# Patient Record
Sex: Male | Born: 1955 | Race: White | Hispanic: No | Marital: Single | Smoking: Former smoker
Health system: Southern US, Community
[De-identification: ages and names within clinical notes are randomized; demographics above are authoritative.]

## PROBLEM LIST (undated history)

## (undated) DIAGNOSIS — K219 Gastro-esophageal reflux disease without esophagitis: Secondary | ICD-10-CM

## (undated) DIAGNOSIS — F329 Major depressive disorder, single episode, unspecified: Secondary | ICD-10-CM

## (undated) DIAGNOSIS — I639 Cerebral infarction, unspecified: Secondary | ICD-10-CM

## (undated) DIAGNOSIS — J45909 Unspecified asthma, uncomplicated: Secondary | ICD-10-CM

## (undated) DIAGNOSIS — B1920 Unspecified viral hepatitis C without hepatic coma: Secondary | ICD-10-CM

## (undated) DIAGNOSIS — I509 Heart failure, unspecified: Secondary | ICD-10-CM

## (undated) DIAGNOSIS — M24572 Contracture, left ankle: Secondary | ICD-10-CM

## (undated) DIAGNOSIS — I10 Essential (primary) hypertension: Secondary | ICD-10-CM

## (undated) DIAGNOSIS — M24561 Contracture, right knee: Secondary | ICD-10-CM

## (undated) DIAGNOSIS — T7840XA Allergy, unspecified, initial encounter: Secondary | ICD-10-CM

## (undated) DIAGNOSIS — R1310 Dysphagia, unspecified: Secondary | ICD-10-CM

## (undated) DIAGNOSIS — M24552 Contracture, left hip: Secondary | ICD-10-CM

## (undated) DIAGNOSIS — G8190 Hemiplegia, unspecified affecting unspecified side: Secondary | ICD-10-CM

## (undated) DIAGNOSIS — F419 Anxiety disorder, unspecified: Secondary | ICD-10-CM

## (undated) DIAGNOSIS — E119 Type 2 diabetes mellitus without complications: Secondary | ICD-10-CM

## (undated) DIAGNOSIS — F1910 Other psychoactive substance abuse, uncomplicated: Secondary | ICD-10-CM

## (undated) HISTORY — DX: Unspecified viral hepatitis C without hepatic coma: B19.20

## (undated) HISTORY — DX: Allergy, unspecified, initial encounter: T78.40XA

## (undated) HISTORY — DX: Unspecified asthma, uncomplicated: J45.909

## (undated) HISTORY — DX: Type 2 diabetes mellitus without complications: E11.9

## (undated) HISTORY — DX: Anxiety disorder, unspecified: F41.9

## (undated) HISTORY — DX: Essential (primary) hypertension: I10

## (undated) HISTORY — DX: Major depressive disorder, single episode, unspecified: F32.9

## (undated) HISTORY — DX: Cerebral infarction, unspecified: I63.9

## (undated) HISTORY — DX: Other psychoactive substance abuse, uncomplicated: F19.10

## (undated) HISTORY — DX: Dysphagia, unspecified: R13.10

---

## 2015-06-05 ENCOUNTER — Inpatient Hospital Stay (HOSPITAL_COMMUNITY)
Admission: EM | Admit: 2015-06-05 | Discharge: 2015-06-27 | DRG: 064 | Disposition: A | Discharge status: Skilled Nursing Facility | Payer: Medicaid Other | Attending: Neurology | Admitting: Neurology

## 2015-06-05 ENCOUNTER — Emergency Department (HOSPITAL_COMMUNITY): Payer: Medicaid Other

## 2015-06-05 DIAGNOSIS — J9600 Acute respiratory failure, unspecified whether with hypoxia or hypercapnia: Secondary | ICD-10-CM | POA: Diagnosis not present

## 2015-06-05 DIAGNOSIS — E870 Hyperosmolality and hypernatremia: Secondary | ICD-10-CM | POA: Diagnosis not present

## 2015-06-05 DIAGNOSIS — T17908A Unspecified foreign body in respiratory tract, part unspecified causing other injury, initial encounter: Secondary | ICD-10-CM | POA: Insufficient documentation

## 2015-06-05 DIAGNOSIS — Z9289 Personal history of other medical treatment: Secondary | ICD-10-CM

## 2015-06-05 DIAGNOSIS — R000 Tachycardia, unspecified: Secondary | ICD-10-CM | POA: Diagnosis present

## 2015-06-05 DIAGNOSIS — I610 Nontraumatic intracerebral hemorrhage in hemisphere, subcortical: Secondary | ICD-10-CM | POA: Diagnosis not present

## 2015-06-05 DIAGNOSIS — I619 Nontraumatic intracerebral hemorrhage, unspecified: Secondary | ICD-10-CM

## 2015-06-05 DIAGNOSIS — R4781 Slurred speech: Secondary | ICD-10-CM | POA: Diagnosis not present

## 2015-06-05 DIAGNOSIS — F419 Anxiety disorder, unspecified: Secondary | ICD-10-CM | POA: Diagnosis present

## 2015-06-05 DIAGNOSIS — R404 Transient alteration of awareness: Secondary | ICD-10-CM

## 2015-06-05 DIAGNOSIS — R0603 Acute respiratory distress: Secondary | ICD-10-CM

## 2015-06-05 DIAGNOSIS — J9811 Atelectasis: Secondary | ICD-10-CM

## 2015-06-05 DIAGNOSIS — I639 Cerebral infarction, unspecified: Secondary | ICD-10-CM

## 2015-06-05 DIAGNOSIS — E876 Hypokalemia: Secondary | ICD-10-CM | POA: Diagnosis not present

## 2015-06-05 DIAGNOSIS — D696 Thrombocytopenia, unspecified: Secondary | ICD-10-CM | POA: Diagnosis present

## 2015-06-05 DIAGNOSIS — R1310 Dysphagia, unspecified: Secondary | ICD-10-CM

## 2015-06-05 DIAGNOSIS — R748 Abnormal levels of other serum enzymes: Secondary | ICD-10-CM | POA: Insufficient documentation

## 2015-06-05 DIAGNOSIS — G9340 Encephalopathy, unspecified: Secondary | ICD-10-CM | POA: Diagnosis present

## 2015-06-05 DIAGNOSIS — G936 Cerebral edema: Secondary | ICD-10-CM | POA: Diagnosis not present

## 2015-06-05 DIAGNOSIS — Y95 Nosocomial condition: Secondary | ICD-10-CM | POA: Diagnosis present

## 2015-06-05 DIAGNOSIS — R29810 Facial weakness: Secondary | ICD-10-CM | POA: Diagnosis present

## 2015-06-05 DIAGNOSIS — B182 Chronic viral hepatitis C: Secondary | ICD-10-CM | POA: Diagnosis present

## 2015-06-05 DIAGNOSIS — E46 Unspecified protein-calorie malnutrition: Secondary | ICD-10-CM | POA: Diagnosis present

## 2015-06-05 DIAGNOSIS — R739 Hyperglycemia, unspecified: Secondary | ICD-10-CM | POA: Diagnosis not present

## 2015-06-05 DIAGNOSIS — Z0189 Encounter for other specified special examinations: Secondary | ICD-10-CM

## 2015-06-05 DIAGNOSIS — J9601 Acute respiratory failure with hypoxia: Secondary | ICD-10-CM | POA: Diagnosis not present

## 2015-06-05 DIAGNOSIS — J154 Pneumonia due to other streptococci: Secondary | ICD-10-CM | POA: Diagnosis not present

## 2015-06-05 DIAGNOSIS — F10239 Alcohol dependence with withdrawal, unspecified: Secondary | ICD-10-CM | POA: Diagnosis present

## 2015-06-05 DIAGNOSIS — G935 Compression of brain: Secondary | ICD-10-CM | POA: Diagnosis not present

## 2015-06-05 DIAGNOSIS — Z720 Tobacco use: Secondary | ICD-10-CM | POA: Insufficient documentation

## 2015-06-05 DIAGNOSIS — R319 Hematuria, unspecified: Secondary | ICD-10-CM | POA: Diagnosis not present

## 2015-06-05 DIAGNOSIS — E785 Hyperlipidemia, unspecified: Secondary | ICD-10-CM | POA: Diagnosis present

## 2015-06-05 DIAGNOSIS — I10 Essential (primary) hypertension: Secondary | ICD-10-CM | POA: Diagnosis present

## 2015-06-05 DIAGNOSIS — I248 Other forms of acute ischemic heart disease: Secondary | ICD-10-CM | POA: Diagnosis not present

## 2015-06-05 DIAGNOSIS — E872 Acidosis: Secondary | ICD-10-CM | POA: Diagnosis not present

## 2015-06-05 DIAGNOSIS — R0600 Dyspnea, unspecified: Secondary | ICD-10-CM

## 2015-06-05 DIAGNOSIS — Z6820 Body mass index (BMI) 20.0-20.9, adult: Secondary | ICD-10-CM

## 2015-06-05 DIAGNOSIS — J690 Pneumonitis due to inhalation of food and vomit: Secondary | ICD-10-CM | POA: Diagnosis not present

## 2015-06-05 DIAGNOSIS — F1410 Cocaine abuse, uncomplicated: Secondary | ICD-10-CM | POA: Insufficient documentation

## 2015-06-05 DIAGNOSIS — G8194 Hemiplegia, unspecified affecting left nondominant side: Secondary | ICD-10-CM | POA: Diagnosis present

## 2015-06-05 DIAGNOSIS — N179 Acute kidney failure, unspecified: Secondary | ICD-10-CM | POA: Diagnosis not present

## 2015-06-05 DIAGNOSIS — F10230 Alcohol dependence with withdrawal, uncomplicated: Secondary | ICD-10-CM | POA: Insufficient documentation

## 2015-06-05 DIAGNOSIS — Z4659 Encounter for fitting and adjustment of other gastrointestinal appliance and device: Secondary | ICD-10-CM

## 2015-06-05 LAB — I-STAT CHEM 8, ED
BUN: 26 mg/dL — ABNORMAL HIGH (ref 6–20)
CHLORIDE: 108 mmol/L (ref 101–111)
CREATININE: 1.1 mg/dL (ref 0.61–1.24)
Calcium, Ion: 1.15 mmol/L (ref 1.13–1.30)
GLUCOSE: 122 mg/dL — AB (ref 65–99)
HEMATOCRIT: 48 % (ref 39.0–52.0)
Hemoglobin: 16.3 g/dL (ref 13.0–17.0)
Potassium: 3.7 mmol/L (ref 3.5–5.1)
Sodium: 143 mmol/L (ref 135–145)
TCO2: 19 mmol/L (ref 0–100)

## 2015-06-05 LAB — PROTIME-INR
INR: 1.11 (ref 0.00–1.49)
Prothrombin Time: 14.5 seconds (ref 11.6–15.2)

## 2015-06-05 LAB — COMPREHENSIVE METABOLIC PANEL
ALT: 208 U/L — ABNORMAL HIGH (ref 17–63)
AST: 186 U/L — ABNORMAL HIGH (ref 15–41)
Albumin: 3.8 g/dL (ref 3.5–5.0)
Alkaline Phosphatase: 75 U/L (ref 38–126)
Anion gap: 8 (ref 5–15)
BILIRUBIN TOTAL: 0.7 mg/dL (ref 0.3–1.2)
BUN: 21 mg/dL — AB (ref 6–20)
CO2: 21 mmol/L — ABNORMAL LOW (ref 22–32)
Calcium: 9.5 mg/dL (ref 8.9–10.3)
Chloride: 109 mmol/L (ref 101–111)
Creatinine, Ser: 1.13 mg/dL (ref 0.61–1.24)
GFR calc Af Amer: >60 mL/min (ref >60)
Glucose, Bld: 121 mg/dL — ABNORMAL HIGH (ref 65–99)
Potassium: 3.8 mmol/L (ref 3.5–5.1)
SODIUM: 138 mmol/L (ref 135–145)
Total Protein: 7.1 g/dL (ref 6.5–8.1)

## 2015-06-05 LAB — DIFFERENTIAL
Band Neutrophils: 0 % (ref 0–10)
Basophils Absolute: 0 10*3/uL (ref 0.0–0.1)
Basophils Relative: 0 % (ref 0–1)
Blasts: 0 %
EOS PCT: 2 % (ref 0–5)
Eosinophils Absolute: 0.1 10*3/uL (ref 0.0–0.7)
LYMPHS ABS: 1.7 10*3/uL (ref 0.7–4.0)
LYMPHS PCT: 29 % (ref 12–46)
METAMYELOCYTES PCT: 0 %
MONOS PCT: 8 % (ref 3–12)
Monocytes Absolute: 0.5 10*3/uL (ref 0.1–1.0)
Myelocytes: 0 %
Neutro Abs: 3.5 10*3/uL (ref 1.7–7.7)
Neutrophils Relative %: 61 % (ref 43–77)
OTHER: 0 %
Promyelocytes Absolute: 0 %
nRBC: 0 /100 WBC

## 2015-06-05 LAB — CBC
HEMATOCRIT: 45 % (ref 39.0–52.0)
Hemoglobin: 15.8 g/dL (ref 13.0–17.0)
MCH: 32.5 pg (ref 26.0–34.0)
MCHC: 35.1 g/dL (ref 30.0–36.0)
MCV: 92.6 fL (ref 78.0–100.0)
Platelets: 69 10*3/uL — ABNORMAL LOW (ref 150–400)
RBC: 4.86 MIL/uL (ref 4.22–5.81)
RDW: 13.6 % (ref 11.5–15.5)
WBC: 5.8 10*3/uL (ref 4.0–10.5)

## 2015-06-05 LAB — APTT: aPTT: 32 seconds (ref 24–37)

## 2015-06-05 LAB — I-STAT TROPONIN, ED: Troponin i, poc: 0.03 ng/mL (ref 0.00–0.08)

## 2015-06-05 MED ORDER — PANTOPRAZOLE SODIUM 40 MG IV SOLR
40.0000 mg | Freq: Every day | INTRAVENOUS | Status: DC
  Administered 2015-06-06 (×2): 40 mg via INTRAVENOUS
  Filled 2015-06-05 (×3): qty 40

## 2015-06-05 MED ORDER — ACETAMINOPHEN 325 MG PO TABS
650.0000 mg | ORAL_TABLET | ORAL | Status: DC | PRN
  Administered 2015-06-08 – 2015-06-11 (×6): 650 mg via ORAL
  Filled 2015-06-05 (×6): qty 2

## 2015-06-05 MED ORDER — LABETALOL HCL 5 MG/ML IV SOLN
10.0000 mg | INTRAVENOUS | Status: DC | PRN
  Administered 2015-06-09: 20 mg via INTRAVENOUS
  Administered 2015-06-09 – 2015-06-10 (×2): 40 mg via INTRAVENOUS
  Administered 2015-06-12 – 2015-06-13 (×2): 20 mg via INTRAVENOUS
  Administered 2015-06-13: 40 mg via INTRAVENOUS
  Administered 2015-06-13: 20 mg via INTRAVENOUS
  Administered 2015-06-13: 40 mg via INTRAVENOUS
  Administered 2015-06-14 (×2): 20 mg via INTRAVENOUS
  Administered 2015-06-15: 40 mg via INTRAVENOUS
  Administered 2015-06-15: 20 mg via INTRAVENOUS
  Administered 2015-06-15: 40 mg via INTRAVENOUS
  Administered 2015-06-15 – 2015-06-17 (×3): 20 mg via INTRAVENOUS
  Filled 2015-06-05 (×2): qty 8
  Filled 2015-06-05: qty 4
  Filled 2015-06-05 (×2): qty 8
  Filled 2015-06-05: qty 4
  Filled 2015-06-05 (×2): qty 8
  Filled 2015-06-05 (×2): qty 4
  Filled 2015-06-05: qty 8
  Filled 2015-06-05 (×2): qty 4
  Filled 2015-06-05: qty 8
  Filled 2015-06-05: qty 4

## 2015-06-05 MED ORDER — CLEVIDIPINE BUTYRATE 0.5 MG/ML IV EMUL
0.0000 mg/h | INTRAVENOUS | Status: DC
  Administered 2015-06-06: 8 mg/h via INTRAVENOUS
  Administered 2015-06-06: 2 mg/h via INTRAVENOUS
  Administered 2015-06-06 – 2015-06-07 (×2): 10 mg/h via INTRAVENOUS
  Administered 2015-06-07: 12 mg/h via INTRAVENOUS
  Administered 2015-06-07: 8 mg/h via INTRAVENOUS
  Administered 2015-06-07: 14 mg/h via INTRAVENOUS
  Administered 2015-06-07: 6 mg/h via INTRAVENOUS
  Administered 2015-06-07: 10 mg/h via INTRAVENOUS
  Administered 2015-06-08: 2.5 mg/h via INTRAVENOUS
  Administered 2015-06-08: 11 mg/h via INTRAVENOUS
  Administered 2015-06-08: 5 mg/h via INTRAVENOUS
  Administered 2015-06-08 (×2): 10 mg/h via INTRAVENOUS
  Administered 2015-06-09: 15 mg/h via INTRAVENOUS
  Administered 2015-06-09: 10 mg/h via INTRAVENOUS
  Filled 2015-06-05 (×4): qty 100
  Filled 2015-06-05: qty 50
  Filled 2015-06-05: qty 100
  Filled 2015-06-05: qty 50
  Filled 2015-06-05: qty 100
  Filled 2015-06-05 (×3): qty 50
  Filled 2015-06-05 (×5): qty 100
  Filled 2015-06-05: qty 50
  Filled 2015-06-05: qty 100
  Filled 2015-06-05: qty 50
  Filled 2015-06-05: qty 100
  Filled 2015-06-05: qty 50
  Filled 2015-06-05: qty 100

## 2015-06-05 MED ORDER — ACETAMINOPHEN 650 MG RE SUPP: 650.0000 mg | RECTAL | Status: DC | PRN

## 2015-06-05 MED ORDER — SENNOSIDES-DOCUSATE SODIUM 8.6-50 MG PO TABS
1.0000 | ORAL_TABLET | Freq: Two times a day (BID) | ORAL | Status: DC
Start: 2015-06-06 — End: 2015-06-27
  Administered 2015-06-07 – 2015-06-26 (×30): 1 via ORAL
  Filled 2015-06-05 (×42): qty 1

## 2015-06-05 MED ORDER — LABETALOL HCL 5 MG/ML IV SOLN
10.0000 mg | INTRAVENOUS | Status: DC | PRN
  Administered 2015-06-05 (×2): 10 mg via INTRAVENOUS
  Filled 2015-06-05: qty 4

## 2015-06-05 MED ORDER — STROKE: EARLY STAGES OF RECOVERY BOOK
Freq: Once | Status: AC
  Administered 2015-06-06: 02:00:00
  Filled 2015-06-05: qty 1

## 2015-06-05 NOTE — ED Provider Notes (Signed)
CSN: [id number]     Arrival date & time 06/05/15  2231 History   First MD Initiated Contact with Patient 06/05/15 2231     Chief Complaint  Patient presents with  . Code Stroke    An emergency department physician performed an initial assessment on this suspected stroke patient at 2231. (Consider location/radiation/quality/duration/timing/severity/associated sxs/prior Treatment) HPI  59 year old male with no known significant past medical history presents as a code stroke. About 1-1/2 hours prior to arrival the patient states he all of a sudden felt left arm and leg weakness to the point that he cannot move. He is also slurring his speech which is new. Patient has not seen a doctor in several years. EMS noted his blood pressure to be 500/938 systolic. Patient denies a prior history of hypertension but a technologist he hasn't seen anybody for any checkups. Patient denies any headache or dizziness.  No past medical history on file. No past surgical history on file. No family history on file. History  Substance Use Topics  . Smoking status: Not on file  . Smokeless tobacco: Not on file  . Alcohol Use: Not on file    Review of Systems  Cardiovascular: Negative for chest pain.  Gastrointestinal: Negative for vomiting.  Neurological: Positive for speech difficulty, weakness and numbness. Negative for headaches.  All other systems reviewed and are negative.     Allergies  Review of patient's allergies indicates no known allergies.  Home Medications   Prior to Admission medications   Not on File   BP 193/110 mmHg  Pulse 95  Temp(Src) 98.3 F (36.8 C) (Oral)  Resp 24  Ht 6' (1.829 m)  Wt 153 lb 7 oz (69.6 kg)  BMI 20.81 kg/m2  SpO2 94% Physical Exam  Constitutional: He is oriented to person, place, and time. He appears well-developed and well-nourished.  HENT:  Head: Normocephalic and atraumatic.  Right Ear: External ear normal.  Left Ear: External ear normal.  Nose:  Nose normal.  Eyes: EOM are normal. Pupils are equal, round, and reactive to light. Right eye exhibits no discharge. Left eye exhibits no discharge.  Neck: Neck supple.  Cardiovascular: Normal rate, regular rhythm, normal heart sounds and intact distal pulses.   Pulmonary/Chest: Effort normal and breath sounds normal.  Abdominal: Soft. He exhibits no distension. There is no tenderness.  Musculoskeletal: He exhibits no edema.  Neurological: He is alert and oriented to person, place, and time.  Slurred speech with left facial droop. LUE and LLE flaccid. RUE, RLE 5/5  Skin: Skin is warm and dry.  Nursing note and vitals reviewed.   ED Course  Procedures (including critical care time) Labs Review Labs Reviewed  CBC - Abnormal; Notable for the following:    Platelets 69 (*)    All other components within normal limits  COMPREHENSIVE METABOLIC PANEL - Abnormal; Notable for the following:    CO2 21 (*)    Glucose, Bld 121 (*)    BUN 21 (*)    AST 186 (*)    ALT 208 (*)    All other components within normal limits  I-STAT CHEM 8, ED - Abnormal; Notable for the following:    BUN 26 (*)    Glucose, Bld 122 (*)    All other components within normal limits  MRSA PCR SCREENING  PROTIME-INR  APTT  DIFFERENTIAL  I-STAT TROPOININ, ED  CBG MONITORING, ED    Imaging Review Ct Head Wo Contrast  06/05/2015   CLINICAL DATA:  59 year old male with left-sided weakness. Code stroke.  EXAM: CT HEAD WITHOUT CONTRAST  TECHNIQUE: Contiguous axial images were obtained from the base of the skull through the vertex without intravenous contrast.  COMPARISON:  None.  FINDINGS: There is a 2.1 x 1.6 cm acute intraparenchymal hemorrhage centered at the right thalamus. There is mild adjacent edema. No significant mass effect. No midline shift.  The ventricles and the sulci are appropriate in size for the patient's age. The The gray-white matter differentiation is preserved.  The visualized paranasal sinuses and  mastoid air cells are well aerated. The calvarium is intact.  IMPRESSION: Acute right thalamic hemorrhage.  Critical Value/emergent results were called by telephone at the time of interpretation on 06/05/2015 at 10:39 pm to Dr. Janann Colonel , who verbally acknowledged these results.   Electronically Signed   By: Anner Crete M.D.   On: 06/05/2015 22:41     EKG Interpretation   Date/Time:  Friday June 05 2015 22:39:21 EDT Ventricular Rate:  108 PR Interval:  145 QRS Duration: 108 QT Interval:  372 QTC Calculation: 499 R Axis:   36 Text Interpretation:  Sinus tachycardia Probable left atrial enlargement  LVH with secondary repolarization abnormality Anterior ST elevation,  probably due to LVH Borderline prolonged QT interval No old tracing to  compare Confirmed by Lavonia Eager  MD, Samira Acero ([id number]) on 06/06/2015 12:12:05 AM      CRITICAL CARE Performed by: Sherwood Gambler T   Total critical care time: 30 minutes  Critical care time was exclusive of separately billable procedures and treating other patients.  Critical care was necessary to treat or prevent imminent or life-threatening deterioration.  Critical care was time spent personally by me on the following activities: development of treatment plan with patient and/or surrogate as well as nursing, discussions with consultants, evaluation of patient's response to treatment, examination of patient, obtaining history from patient or surrogate, ordering and performing treatments and interventions, ordering and review of laboratory studies, ordering and review of radiographic studies, pulse oximetry and re-evaluation of patient's condition.  MDM   Final diagnoses:  Nontraumatic subcortical hemorrhage of cerebral hemisphere    Patient with an acute nontraumatic intracranial hemorrhage that appears to be causing all the symptoms. His airway is intact, he has slurred speech but no signs of airway compromise. Given his significant hypertension, he  was given labetalol (with tachycardia) and then started clevidipine for BP control. Neuro at bedside and will admit to ICU.   Sherwood Gambler, MD 06/06/15 [phone]

## 2015-06-05 NOTE — H&P (Addendum)
Stroke Consult    Chief Complaint: left sided weakness HPI: Mike Rowe is an 59 y.o. male with no reported past medical history (hasn't been to doctors in years) presents with acute onset of slurred speech and left sided weakness. LSW 2130 when he noted he was unable to stand or use his left side. EMS called, upon arrival noted BP of 240/140. Code stroke activated. CT head imaging reviewed, shows right sided thalamic/BG ICH. Denies taking any blood thinners.   He denies any past medical history including hypertension. Denies any surgical history. Denies alcohol or drug abuse.   Date last known well: 06/05/2015 Time last known well: 2130 tPA Given: no, ICH Modified Rankin: 0 ICH Score: 0   Initial NIHSS of 12  No past medical history on file.  No past surgical history on file.  No family history on file. Social History:  has no tobacco, alcohol, and drug history on file.  Allergies: No Known Allergies   (Not in a hospital admission)  ROS: Out of a complete 14 system review, the patient complains of only the following symptoms, and all other reviewed systems are negative. + weakness   Physical Examination: Filed Vitals:   06/05/15 2251  BP: 193/110  Pulse: 95  Temp:   Resp: 24   Physical Exam  Constitutional: He appears well-developed and well-nourished.  Psych: Affect appropriate to situation Eyes: No scleral injection HENT: No OP obstrucion Head: Normocephalic.  Cardiovascular: Normal rate and regular rhythm.  Respiratory: Effort normal and breath sounds normal.  GI: Soft. Bowel sounds are normal. No distension. There is no tenderness.  Skin: WDI  Neurologic Examination: Mental Status: Alert, oriented, thought content appropriate.  Speech fluent without evidence of aphasia. Moderate dysarthria. Able to follow 3 step commands without difficulty. Cranial Nerves: II: funduscopic exam wnl bilaterally, visual fields grossly normal, pupils equal, round, reactive to  light and accommodation III,IV, VI: ptosis not present, extra-ocular motions intact bilaterally V,VII: left facial weakness, facial light touch sensation normal bilaterally VIII: hearing normal bilaterally IX,X: gag reflex present XI: trapezius strength/neck flexion strength normal bilaterally XII: tongue strength normal  Motor: RUE and RLE 5/5 strength Flaccid LUE 1/5 proximal LLE, 2/5 distal Sensory: decreased LT and PP on left side Deep Tendon Reflexes: 2+ and symmetric throughout Plantars: Right: downgoing   Left: downgoing Cerebellar: Normal FTN and HTS on right side Gait: deferred  Laboratory Studies:   Basic Metabolic Panel:  Recent Labs Lab 06/05/15 2234  NA 143  K 3.7  CL 108  GLUCOSE 122*  BUN 26*  CREATININE 1.10    Liver Function Tests: No results for input(s): AST, ALT, ALKPHOS, BILITOT, PROT, ALBUMIN in the last 168 hours. No results for input(s): LIPASE, AMYLASE in the last 168 hours. No results for input(s): AMMONIA in the last 168 hours.  CBC:  Recent Labs Lab 06/05/15 2227 06/05/15 2234  WBC 5.8  --   NEUTROABS PENDING  --   HGB 15.8 16.3  HCT 45.0 48.0  MCV 92.6  --   PLT PENDING  --     Cardiac Enzymes: No results for input(s): CKTOTAL, CKMB, CKMBINDEX, TROPONINI in the last 168 hours.  BNP: Invalid input(s): POCBNP  CBG: No results for input(s): GLUCAP in the last 168 hours.  Microbiology: No results found for this or any previous visit.  Coagulation Studies:  Recent Labs  06/05/15 2227  LABPROT 14.5  INR 1.11    Urinalysis: No results for input(s): COLORURINE, LABSPEC, [place], GLUCOSEU, [place], [place],  KETONESUR, PROTEINUR, UROBILINOGEN, NITRITE, LEUKOCYTESUR in the last 168 hours.  Invalid input(s): APPERANCEUR  Lipid Panel:  No results found for: CHOL, TRIG, HDL, CHOLHDL, VLDL, LDLCALC  HgbA1C: No results found for: HGBA1C  Urine Drug Screen:  No results found for: LABOPIA, COCAINSCRNUR, LABBENZ, AMPHETMU,  THCU, LABBARB  Alcohol Level: No results for input(s): ETH in the last 168 hours.  Other results: EKG: sinus tachycardia.  Imaging: Ct Head Wo Contrast  06/05/2015   CLINICAL DATA:  59 year old male with left-sided weakness. Code stroke.  EXAM: CT HEAD WITHOUT CONTRAST  TECHNIQUE: Contiguous axial images were obtained from the base of the skull through the vertex without intravenous contrast.  COMPARISON:  None.  FINDINGS: There is a 2.1 x 1.6 cm acute intraparenchymal hemorrhage centered at the right thalamus. There is mild adjacent edema. No significant mass effect. No midline shift.  The ventricles and the sulci are appropriate in size for the patient's age. The The gray-white matter differentiation is preserved.  The visualized paranasal sinuses and mastoid air cells are well aerated. The calvarium is intact.  IMPRESSION: Acute right thalamic hemorrhage.  Critical Value/emergent results were called by telephone at the time of interpretation on 06/05/2015 at 10:39 pm to Dr. Janann Colonel , who verbally acknowledged these results.   Electronically Signed   By: Anner Crete M.D.   On: 06/05/2015 22:41    Assessment: 60 y.o. male with no reported past medical history (has not been to doctor in years) presents with acute onset of slurred speech and left sided weakness in the setting of marked hypertension. CT head shows ICH in right thalamus/BG most likely consistent with a hypertensive bleed. Patient has history of polysubstance abuse. Will check urine drug screen.    Plan: 1) Admit to ICU 2) no antiplatelets or anticoagulants 3) blood pressure control with goal systolic <481.  4) Frequent neuro checks 6) If symptoms worsen or there is decreased mental status, repeat stat head CT 7) PT,OT,ST  This patient is critically ill and at significant risk of neurological worsening, death and care requires constant monitoring of vital signs, hemodynamics,respiratory and cardiac monitoring,review of multiple  databases, neurological assessment, discussion with family, other specialists and medical decision making of high complexity. I spent 45 inutes of neurocritical care time in the care of this patient.   Jim Like, DO [facility] [phone]  If 7pm- 7am, please page neurology on call as listed in [place]. 06/05/2015, 10:58 PM

## 2015-06-05 NOTE — ED Notes (Signed)
Per EMS, pt was last seen normal at 2130 tonight. Pt c/o left sided weakness. Upon EMS arrival, pt was flaccid on the left side with left sided facial droop. Pt was hypertensive for EMS, with a BP of 240/140. CBG - 116.

## 2015-06-05 NOTE — Progress Notes (Signed)
Code Stroke called on 59 y.o per EMS while en route. Per Pt he developed left sided weakness this evening at 2130 and called EMS. Upon arrival to [facility] Pt severely hypertensive. CBG 116. Pt denies any medical history, but admits to not seeing a Physician in "years". Denies medication use, including any blood thinners.  Taken to CT STAT, revealing acute right thalamic hemorrhage. CT scan reviewed per Neurologist. Labetolol  10 mg given stat and Clevidipine gtt ordered for BP uncontrolled with labetolol IVP. NIHSS completed yielding 12 for sever left sided weakness, slurred speech, facial droop, and mild sensory impairment. Pt for admit to ICU tonight. Pt updated on plan of care per Neurologist.

## 2015-06-06 ENCOUNTER — Inpatient Hospital Stay (HOSPITAL_COMMUNITY): Payer: Medicaid Other

## 2015-06-06 ENCOUNTER — Other Ambulatory Visit (HOSPITAL_COMMUNITY): Payer: Self-pay

## 2015-06-06 LAB — RAPID URINE DRUG SCREEN, HOSP PERFORMED
Amphetamines: NOT DETECTED
Barbiturates: NOT DETECTED
Benzodiazepines: NOT DETECTED
Cocaine: POSITIVE — AB
OPIATES: NOT DETECTED
Tetrahydrocannabinol: NOT DETECTED

## 2015-06-06 LAB — MRSA PCR SCREENING: MRSA by PCR: NEGATIVE

## 2015-06-06 MED ORDER — LORAZEPAM 2 MG/ML IJ SOLN
1.0000 mg | Freq: Once | INTRAMUSCULAR | Status: AC
  Administered 2015-06-06: 1 mg via INTRAVENOUS

## 2015-06-06 MED ORDER — LORAZEPAM 2 MG/ML IJ SOLN
INTRAMUSCULAR | Status: AC
  Administered 2015-06-06: 1 mg via INTRAVENOUS
  Filled 2015-06-06: qty 1

## 2015-06-06 MED ORDER — LORAZEPAM 2 MG/ML IJ SOLN
1.0000 mg | Freq: Four times a day (QID) | INTRAMUSCULAR | Status: DC | PRN
  Administered 2015-06-06 – 2015-06-13 (×12): 1 mg via INTRAVENOUS
  Filled 2015-06-06 (×14): qty 1

## 2015-06-06 NOTE — Progress Notes (Signed)
Full MBS report now available under imaging section of the notes  Arvil Chaco MA, [place] Speech Language Pathologist

## 2015-06-06 NOTE — Progress Notes (Signed)
eLink Physician-Brief Progress Note Patient Name: Mike Rowe DOB: [birth year] MRN: [id number]   Date of Service  06/06/2015  HPI/Events of Note  68 M with no known PMH presenting with findings of CVA and acute right thalamic hemorrhage on CT.  Hypertensive.  Patient on Claviprex for BP control.  eICU Interventions  Plan of care per primary neurology team Continue to monitor via [facility]     Intervention Category Evaluation Type: New Patient Evaluation  Angelique Chevalier 06/06/2015, 12:25 AM

## 2015-06-06 NOTE — Progress Notes (Signed)
STROKE TEAM PROGRESS NOTE   HISTORY Mike Rowe is an 59 y.o. male with no reported past medical history (hasn't been to doctors in years) presents with acute onset of slurred speech and left sided weakness. LSW 2130 when he noted he was unable to stand or use his left side. EMS called, upon arrival noted BP of 240/140. Code stroke activated. CT head imaging reviewed, shows right sided thalamic/BG ICH. Denies taking any blood thinners.   He denies any past medical history including hypertension. Denies any surgical history. Denies alcohol or drug abuse.    Date last known well: 06/05/2015 Time last known well: 2130 tPA Given: no, ICH Modified Rankin: 0 ICH Score: 0  Initial NIHSS of 12    SUBJECTIVE (INTERVAL HISTORY) He is extremely agitated this morning.  Overall he feels his condition is worse due to hemiparesis   OBJECTIVE Temp:  [97.6 F (36.4 C)-98.8 F (37.1 C)] 97.6 F (36.4 C) (07/23 0339) Pulse Rate:  [76-119] 90 (07/23 0600) Cardiac Rhythm:  [-]  Resp:  [8-32] 20 (07/23 0800) BP: (119-217)/(60-146) 160/91 mmHg (07/23 0800) SpO2:  [94 %-100 %] 94 % (07/23 0600) Weight:  [69.6 kg (153 lb 7 oz)] 69.6 kg (153 lb 7 oz) (07/22 2241)  No results for input(s): GLUCAP in the last 168 hours.  Recent Labs Lab 06/05/15 2227 06/05/15 2234  NA 138 143  K 3.8 3.7  CL 109 108  CO2 21*  --   GLUCOSE 121* 122*  BUN 21* 26*  CREATININE 1.13 1.10  CALCIUM 9.5  --     Recent Labs Lab 06/05/15 2227  AST 186*  ALT 208*  ALKPHOS 75  BILITOT 0.7  PROT 7.1  ALBUMIN 3.8    Recent Labs Lab 06/05/15 2227 06/05/15 2234  WBC 5.8  --   NEUTROABS 3.5  --   HGB 15.8 16.3  HCT 45.0 48.0  MCV 92.6  --   PLT 69*  --    No results for input(s): CKTOTAL, CKMB, CKMBINDEX, TROPONINI in the last 168 hours.  Recent Labs  06/05/15 2227  LABPROT 14.5  INR 1.11   No results for input(s): COLORURINE, LABSPEC, PHURINE, GLUCOSEU, HGBUR, BILIRUBINUR, KETONESUR, PROTEINUR,  UROBILINOGEN, NITRITE, LEUKOCYTESUR in the last 72 hours.  Invalid input(s): APPERANCEUR  No results found for: CHOL, TRIG, HDL, CHOLHDL, VLDL, LDLCALC No results found for: HGBA1C    Component Value Date/Time   LABOPIA NONE DETECTED 06/06/2015 0100   COCAINSCRNUR POSITIVE* 06/06/2015 0100   LABBENZ NONE DETECTED 06/06/2015 0100   AMPHETMU NONE DETECTED 06/06/2015 0100   THCU NONE DETECTED 06/06/2015 0100   LABBARB NONE DETECTED 06/06/2015 0100    No results for input(s): ETH in the last 168 hours.  Imaging   Ct Head Wo Contrast 06/05/2015    Acute right thalamic hemorrhage.       PHYSICAL EXAM Neurologic Examination: Mental Status: Alert, oriented, thought content appropriate. Speech fluent without evidence of aphasia. Moderate dysarthria. Able to follow 3 step commands without difficulty. Cranial Nerves: II: funduscopic exam wnl bilaterally, visual fields grossly normal, pupils equal, round, reactive to light and accommodation III,IV, VI: ptosis not present, extra-ocular motions intact bilaterally V,VII: left facial weakness, facial light touch sensation normal bilaterally VIII: hearing normal bilaterally IX,X: gag reflex present XI: trapezius strength/neck flexion strength normal bilaterally XII: tongue strength normal  Motor: left sided weakness is significant  Sensory: decreased LT and PP on left side Deep Tendon Reflexes: 2+ and symmetric throughout Plantars: Right: downgoingLeft: downgoing  Cerebellar: Normal FTN and HTS on right side Gait: deferred   ASSESSMENT/PLAN [name] is a 59 y.o. male with no significant past medical history no regular medical follow-up presenting with left hemiparesis really elevated blood pressures. He did not receive IV t-PA due to acute right thalamic hemorrhage.   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  Resultant  left hemiparesis  MRI  not  performed  MRA  not performed   Carotid Doppler  not indicated  2D Echo  pending  LDL not indicated  HgbA1c pending  SCDs for VTE prophylaxis  Diet NPO time specified  no antithrombotic prior to admission, now on no antithrombotic secondary to hemorrhage  Ongoing aggressive stroke risk factor management  Therapy recommendations: Pending - currently NPO per speech  Disposition:  Pending  Hypertension  Home meds:  No antihypertensives medications prior to admission  Stable   Other Stroke Risk Factors  Patient sleeping deeply after ativan.  Will obtain history for Cigarette smoking and alcohol use  Substance abuse  Other Active Problems  Thrombocytopenia  Elevated liver function tests  Anxiety / agitation  UDS positive for cocaine  Mildly elevated BUN  Other Pertinent History  Substance abuse   PLAN  IV Ativan when necessary for anxiety  Repeat head CT in a.m.  Continue to treat hypertension with IV medications until swallowing has been cleared.  Check 2-D echo, hemoglobin A1c, repeat CBC  Modified barium swallow today with speech  Hospital day # 1  Mike Bussing PA-C [facility] Pager [phone] 06/06/2015, 8:32 AM ATTENDING NOTE: Patient was seen and examined by me personally. Documentation accurately reflects findings. The laboratory and radiographic studies reviewed by me. ROS:  pertinent positives include anxiety and hunger.  Patient awaiting ST evaluation Assessment and plan completed by me personally and fully documented above.  Plans include:   Continuous cardiopulmonary monitoring Treat malignant hypertension with Cleviprex until cleared for oral medications Ativan for anxiety Repeat CT scan of the head in the AM Echo ordered PT/OT ST to do MBS Condition is unchanged.  Critical due to risk for worsening cerebral edema; ICH SIGNED BY: Dr. Elissa Hefty         To contact Stroke Continuity provider,  please refer to [email or web address] After hours, contact General Neurology

## 2015-06-06 NOTE — Evaluation (Signed)
Physical Therapy Evaluation Patient Details Name: Mike Rowe MRN: [id number] DOB: May 13, 1956 Today's Date: 06/06/2015   History of Present Illness  Adelbert Gaspard is an 59 y.o. male with no reported past medical history (hasn't been to doctors in years) presents with acute onset of slurred speech and left sided weakness. LSW 2130 when he noted he was unable to stand or use his left side. EMS called, upon arrival noted BP of 240/140. Code stroke activated. CT head imaging reviewed, shows right sided thalamic/BG ICH. Denies taking any blood thinners  Clinical Impression  Pt admitted with above diagnosis. Pt currently with functional limitations due to the deficits listed below (see PT Problem List). Pt anxiety kept pt from being able to participate much in session today.  Pt has movement but not following commands well and anxious.  Will assess further next treatment.   Pt will benefit from skilled PT to increase their independence and safety with mobility to allow discharge to the venue listed below.      Follow Up Recommendations SNF;Supervision/Assistance - 24 hour    Equipment Recommendations  Other (comment) (TBA)    Recommendations for Other Services       Precautions / Restrictions Precautions Precautions: Fall Restrictions Weight Bearing Restrictions: No      Mobility  Bed Mobility Overal bed mobility: Needs Assistance;+2 for physical assistance Bed Mobility: Rolling Rolling: Mod assist         General bed mobility comments: Pt needed assist and use of rails.  Anxious and needed cues to sequence.    Transfers                 General transfer comment: Pt too anxious to get to EOB. Did place pt in chair position with total assist to mvoe pt to [facility].   Ambulation/Gait                Stairs            Wheelchair Mobility    Modified Rankin (Stroke Patients Only) Modified Rankin (Stroke Patients Only) Pre-Morbid Rankin Score: Moderate  disability Modified Rankin: Severe disability     Balance                                             Pertinent Vitals/Pain Pain Assessment: Faces Faces Pain Scale: Hurts little more Pain Location:  (generalized) Pain Descriptors / Indicators: Grimacing;Sore Pain Intervention(s): Limited activity within patient's tolerance;Monitored during session;Repositioned  VSS    Home Living Family/patient expects to be discharged to:: Private residence Living Arrangements: Alone Available Help at Discharge: Other (Comment) (Unsure as pt can't answer questions)             Additional Comments: pt unable to answer questions due to confusion and lethargy.    Prior Function           Comments: unsure     Hand Dominance        Extremity/Trunk Assessment   Upper Extremity Assessment: Defer to OT evaluation           Lower Extremity Assessment: LLE deficits/detail   LLE Deficits / Details: appears 2-/5, extensor tone noted   Cervical / Trunk Assessment: Normal  Communication   Communication: No difficulties  Cognition Arousal/Alertness: Lethargic Behavior During Therapy: Anxious Overall Cognitive Status: Impaired/Different from baseline Area of Impairment: Following commands;Safety/judgement;Attention;Orientation;Awareness;Problem solving Orientation Level: Disoriented to;Place;Time;Situation  Current Attention Level: Focused   Following Commands: Follows one step commands inconsistently;Follows one step commands with increased time Safety/Judgement: Decreased awareness of safety;Decreased awareness of deficits Awareness: Intellectual Problem Solving: Slow processing;Decreased initiation;Difficulty sequencing;Requires verbal cues;Requires tactile cues      General Comments General comments (skin integrity, edema, etc.): Pt anxious and moving in bed restless.  did not get pt to EOB due to this.  Bed level assessment.  No family present and  couldnot find out any information.      Exercises General Exercises - Upper Extremity Shoulder Flexion: 10 reps;Supine;Left;AAROM Shoulder ABduction: 5 reps;Seated;Left;AAROM Elbow Flexion: AAROM;Left;5 reps;Supine Elbow Extension: AAROM;Left;5 reps;Supine General Exercises - Lower Extremity Ankle Circles/Pumps: AAROM;Left;5 reps;Supine Heel Slides: AAROM;Left;10 reps;Supine Hip ABduction/ADduction: AAROM;Left;5 reps;Supine Other Exercises Other Exercises: Tone noted in left UE and LE.        Assessment/Plan    PT Assessment Patient needs continued PT services  PT Diagnosis Generalized weakness   PT Problem List Decreased activity tolerance;Decreased balance;Decreased mobility;Decreased knowledge of use of DME;Decreased safety awareness;Decreased knowledge of precautions;Decreased strength  PT Treatment Interventions DME instruction;Gait training;Functional mobility training;Therapeutic activities;Therapeutic exercise;Balance training;Patient/family education   PT Goals (Current goals can be found in the Care Plan section) Acute Rehab PT Goals Patient Stated Goal: to get bettter PT Goal Formulation: With patient Time For Goal Achievement: 06/20/15 Potential to Achieve Goals: Good    Frequency Min 3X/week   Barriers to discharge        Co-evaluation               End of Session Equipment Utilized During Treatment: Gait belt Activity Tolerance: Patient limited by fatigue Patient left: in bed;with call bell/phone within reach;with bed alarm set Nurse Communication: Mobility status;Need for lift equipment         Time: 0915-0930 PT Time Calculation (min) (ACUTE ONLY): 15 min   Charges:   PT Evaluation $Initial PT Evaluation Tier I: 1 Procedure     PT G CodesDenice Paradise 2015/06/13, 11:10 AM Amanda [facility] Acute [facility] [phone] (pager)

## 2015-06-06 NOTE — Evaluation (Signed)
Clinical/Bedside Swallow Evaluation Patient Details  Name: Mike Rowe MRN: [id number] Date of Birth: [birth year]  Today's Date: 06/06/2015 Time: SLP Start Time (ACUTE ONLY): 0820 SLP Stop Time (ACUTE ONLY): 0845 SLP Time Calculation (min) (ACUTE ONLY): 25 min  Past Medical History: No past medical history on file. Past Surgical History: No past surgical history on file. HPI:  Mike Rowe is an 59 y.o. male with no reported past medical history (hasn't been to doctors in years) presents with acute onset of slurred speech and left sided weakness. LSW 2130 when he noted he was unable to stand or use his left side. EMS called, upon arrival noted BP of 240/140. Code stroke activated. CT head imaging reviewed, shows right sided thalamic/BG ICH. Denies taking any blood thinners   Assessment / Plan / Recommendation Clinical Impression  Pt very eager for PO with noted behavior implications including agitation, decreased insight to deficits and impulsivity. 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 Left sided facial droop, decreased left sided sensation of lingual and labial areas, and left sided anterior spillage and pocketting all aparent throughout PO trials.  Intermittent wet vocal quality present with all POs. Concern for decreased sensation of pharyngeal musculature putting patient at increased risk for silent aspiration. Recommend MBS  prior to initiation of diet. MBS planned for 12 pm this date.      Aspiration Risk  Moderate    Diet Recommendation NPO (until MBS )        Other  Recommendations Oral Care Recommendations: Oral care QID   Follow Up Recommendations       Frequency and Duration min 2x/week  2 weeks   Pertinent Vitals/Pain     SLP Swallow Goals     Swallow Study Prior Functional Status       General Date of Onset: 06/05/15 Other Pertinent Information: Mike Rowe is an 59 y.o. male with no reported past medical  history (hasn't been to doctors in years) presents with acute onset of slurred speech and left sided weakness. LSW 2130 when he noted he was unable to stand or use his left side. EMS called, upon arrival noted BP of 240/140. Code stroke activated. CT head imaging reviewed, shows right sided thalamic/BG ICH. Denies taking any blood thinners Type of Study: Bedside swallow evaluation Diet Prior to this Study: NPO Temperature Spikes Noted: No Behavior/Cognition: Agitated;Impulsive;Uncooperative Oral Cavity - Dentition: Poor condition;Missing dentition Self-Feeding Abilities: Able to feed self;Needs assist Patient Positioning: Upright in bed Baseline Vocal Quality: Normal Volitional Cough: Strong Volitional Swallow: Able to elicit    Oral/Motor/Sensory Function Overall Oral Motor/Sensory Function: Impaired Labial ROM: Reduced left Labial Symmetry: Abnormal symmetry left Labial Strength: Reduced Labial Sensation: Reduced Lingual ROM: Reduced left Lingual Symmetry: Abnormal symmetry left Lingual Strength: Reduced Lingual Sensation: Reduced Facial ROM: Reduced left Facial Sensation: Reduced   Ice Chips Ice chips: Impaired Presentation: Spoon Oral Phase Impairments: Reduced labial seal;Reduced lingual movement/coordination Oral Phase Functional Implications: Left anterior spillage;Left lateral sulci pocketing;Prolonged oral transit;Oral residue Pharyngeal Phase Impairments: Suspected delayed Swallow;Wet Vocal Quality   Thin Liquid Thin Liquid: Impaired Presentation: Cup;Straw;Spoon Oral Phase Impairments: Reduced lingual movement/coordination;Reduced labial seal Oral Phase Functional Implications: Left anterior spillage;Oral residue;Prolonged oral transit Pharyngeal  Phase Impairments: Suspected delayed Swallow;Multiple swallows;Wet Vocal Quality    Nectar Thick Nectar Thick Liquid: Not tested   Honey Thick Honey Thick Liquid: Not tested   Puree Puree: Impaired Presentation: Spoon Oral  Phase Impairments: Reduced lingual movement/coordination;Reduced  labial seal Oral Phase Functional Implications: Left anterior spillage;Prolonged oral transit;Oral residue Pharyngeal Phase Impairments: Suspected delayed Swallow;Multiple swallows;Wet Vocal Quality   Solid   GO  Mike Chaco MA, CCC-SLP Acute Care Speech Language Pathologist      Solid: Not tested       Mike Rowe E 06/06/2015,8:58 AM

## 2015-06-07 ENCOUNTER — Inpatient Hospital Stay (HOSPITAL_COMMUNITY): Payer: Medicaid Other

## 2015-06-07 LAB — COMPREHENSIVE METABOLIC PANEL
ALK PHOS: 76 U/L (ref 38–126)
ALT: 211 U/L — AB (ref 17–63)
AST: 180 U/L — ABNORMAL HIGH (ref 15–41)
Albumin: 3.6 g/dL (ref 3.5–5.0)
Anion gap: 9 (ref 5–15)
BUN: 13 mg/dL (ref 6–20)
CO2: 21 mmol/L — ABNORMAL LOW (ref 22–32)
Calcium: 8.7 mg/dL — ABNORMAL LOW (ref 8.9–10.3)
Chloride: 105 mmol/L (ref 101–111)
Creatinine, Ser: 0.82 mg/dL (ref 0.61–1.24)
GFR calc non Af Amer: >60 mL/min (ref >60)
Glucose, Bld: 107 mg/dL — ABNORMAL HIGH (ref 65–99)
Potassium: 3.9 mmol/L (ref 3.5–5.1)
SODIUM: 135 mmol/L (ref 135–145)
Total Bilirubin: 0.9 mg/dL (ref 0.3–1.2)
Total Protein: 7.4 g/dL (ref 6.5–8.1)

## 2015-06-07 LAB — CBC
HCT: 43.1 % (ref 39.0–52.0)
HEMOGLOBIN: 14.9 g/dL (ref 13.0–17.0)
MCH: 31.6 pg (ref 26.0–34.0)
MCHC: 34.6 g/dL (ref 30.0–36.0)
MCV: 91.5 fL (ref 78.0–100.0)
Platelets: 74 10*3/uL — ABNORMAL LOW (ref 150–400)
RBC: 4.71 MIL/uL (ref 4.22–5.81)
RDW: 13.5 % (ref 11.5–15.5)
WBC: 7 10*3/uL (ref 4.0–10.5)

## 2015-06-07 LAB — PHOSPHORUS: Phosphorus: 3.1 mg/dL (ref 2.5–4.6)

## 2015-06-07 LAB — GLUCOSE, CAPILLARY
GLUCOSE-CAPILLARY: 107 mg/dL — AB (ref 65–99)
Glucose-Capillary: 162 mg/dL — ABNORMAL HIGH (ref 65–99)
Glucose-Capillary: 117 mg/dL — ABNORMAL HIGH (ref 65–99)
Glucose-Capillary: 109 mg/dL — ABNORMAL HIGH (ref 65–99)
Glucose-Capillary: 106 mg/dL — ABNORMAL HIGH (ref 65–99)

## 2015-06-07 LAB — PROTIME-INR
INR: 1.09 (ref 0.00–1.49)
Prothrombin Time: 14.3 seconds (ref 11.6–15.2)

## 2015-06-07 LAB — MAGNESIUM: Magnesium: 2.1 mg/dL (ref 1.7–2.4)

## 2015-06-07 LAB — TSH: TSH: 2.017 u[IU]/mL (ref 0.350–4.500)

## 2015-06-07 LAB — APTT: aPTT: 34 seconds (ref 24–37)

## 2015-06-07 MED ORDER — HALOPERIDOL LACTATE 5 MG/ML IJ SOLN
5.0000 mg | Freq: Once | INTRAMUSCULAR | Status: AC | PRN
  Filled 2015-06-07: qty 1

## 2015-06-07 MED ORDER — LISINOPRIL 20 MG PO TABS
20.0000 mg | ORAL_TABLET | Freq: Every day | ORAL | Status: DC
  Administered 2015-06-07 – 2015-06-10 (×3): 20 mg via ORAL
  Filled 2015-06-07 (×4): qty 1

## 2015-06-07 MED ORDER — PANTOPRAZOLE SODIUM 40 MG PO TBEC: 40.0000 mg | DELAYED_RELEASE_TABLET | Freq: Every day | ORAL | Status: DC

## 2015-06-07 MED ORDER — FOLIC ACID 1 MG PO TABS
1.0000 mg | ORAL_TABLET | Freq: Every day | ORAL | Status: DC
  Administered 2015-06-07 – 2015-06-27 (×20): 1 mg via ORAL
  Filled 2015-06-07 (×21): qty 1

## 2015-06-07 MED ORDER — METOPROLOL TARTRATE 50 MG PO TABS
50.0000 mg | ORAL_TABLET | Freq: Two times a day (BID) | ORAL | Status: DC
  Administered 2015-06-07 – 2015-06-13 (×12): 50 mg via ORAL
  Filled 2015-06-07 (×14): qty 1

## 2015-06-07 MED ORDER — INSULIN ASPART 100 UNIT/ML ~~LOC~~ SOLN
1.0000 [IU] | SUBCUTANEOUS | Status: DC
  Administered 2015-06-07: 2 [IU] via SUBCUTANEOUS
  Administered 2015-06-09 – 2015-06-10 (×6): 1 [IU] via SUBCUTANEOUS
  Administered 2015-06-10: 2 [IU] via SUBCUTANEOUS
  Administered 2015-06-10 (×2): 1 [IU] via SUBCUTANEOUS
  Administered 2015-06-11: 2 [IU] via SUBCUTANEOUS
  Administered 2015-06-11: 1 [IU] via SUBCUTANEOUS
  Administered 2015-06-11 (×2): 2 [IU] via SUBCUTANEOUS
  Administered 2015-06-11 – 2015-06-12 (×4): 1 [IU] via SUBCUTANEOUS
  Administered 2015-06-12: 2 [IU] via SUBCUTANEOUS
  Administered 2015-06-12 – 2015-06-13 (×4): 1 [IU] via SUBCUTANEOUS

## 2015-06-07 MED ORDER — VITAMIN B-1 100 MG PO TABS
100.0000 mg | ORAL_TABLET | Freq: Every day | ORAL | Status: DC
  Administered 2015-06-07 – 2015-06-27 (×20): 100 mg via ORAL
  Filled 2015-06-07 (×21): qty 1

## 2015-06-07 NOTE — Progress Notes (Signed)
Speech Language Pathology Treatment: Dysphagia  Patient Details Name: Mike Rowe MRN: [id number] DOB: 1956-09-11 Today's Date: 06/07/2015 Time: 8768-1157 SLP Time Calculation (min) (ACUTE ONLY): 26 min  Assessment / Plan / Recommendation Clinical Impression  Pt seen with chopped and thin liquid breakfast meal. Noted left sided leaning throughout consumption, requiring repositioning frequently to maintain upright positioning. Pt still displaying impuslivity with feeding despite cueing. Oral phase of swallow deficits include left sided anterior spillage, decreased bolus cohesion, decreased left sided lingual labial, and buccal sensation, and left sided pocketting requiring finger sweep to extract residuals. Educated regarding safe swallow strategies, however implementation from pt still poor.  No overt signs or symptoms of aspiration. ST will continue to follow.      HPI Other Pertinent Information: Daelin Haste is an 59 y.o. male with no reported past medical history (hasn't been to doctors in years) presents with acute onset of slurred speech and left sided weakness. LSW 2130 when he noted he was unable to stand or use his left side. EMS called, upon arrival noted BP of 240/140. Code stroke activated. CT head imaging reviewed, shows right sided thalamic/BG ICH. Denies taking any blood thinners   Pertinent Vitals Pain Assessment: Faces Faces Pain Scale: Hurts little more Pain Intervention(s): Premedicated before session;Utilized relaxation techniques  SLP Plan  Continue with current plan of care    Recommendations Diet recommendations: Dysphagia 2 (fine chop);Thin liquid Liquids provided via: Cup Medication Administration: Whole meds with liquid Supervision: Full supervision/cueing for compensatory strategies;Staff to assist with self feeding;Patient able to self feed Compensations: Check for anterior loss;Check for pocketing;Small sips/bites;Slow rate;Minimize environmental distractions;Follow  solids with liquid Postural Changes and/or Swallow Maneuvers: Seated upright 90 degrees              Oral Care Recommendations: Oral care BID Follow up Recommendations: Inpatient Rehab;Skilled Nursing facility Plan: Continue with current plan of care    [place] MA, [place] Language Pathologist     Levi Aland 06/07/2015, 9:57 AM

## 2015-06-07 NOTE — Progress Notes (Signed)
0630 patient bed alarm sounded. Staff immediately went to assist and assess patient. Patient found lying in floor next to bed. Took patient vital signs. Patient stated he was not injured. No obvious injuries noted. Called MD. Vital signs stable. Assisted patient to chair, transferred patient from chair to bed. [phone] Dr. Janann Colonel at bedside. Ordered Stat head CT. Safety Sitter at bedside upon return to patient room after CT. Fall Huddle completed. Post Fall flow sheet completed. SZP completed.

## 2015-06-07 NOTE — Progress Notes (Signed)
PHARMACIST - PHYSICIAN COMMUNICATION DR:   Erlinda Hong CONCERNING: Protonix IV to Oral Route Change Policy  RECOMMENDATION: This patient is receiving Protonix by the intravenous route.  Based on criteria approved by the Pharmacy and Therapeutics Committee, this drug is being converted to the equivalent oral dose form(s).  DESCRIPTION: These criteria include:  The patient is eating (either orally or via tube) and/or has been taking other orally administered medications for a least 24 hours  There is no active GI bleed or impaired GI absorption noted.   If you have questions about this conversion, please contact the Pharmacy Department  []   [phone] )  [facility] [x]   [phone] )  [facility]  []   [phone] )  [facility] []   [phone] )  [place], PharmD, BCPS Clinical Pharmacist Pager: [phone] 06/07/2015 1:28 PM

## 2015-06-07 NOTE — Progress Notes (Signed)
Notified by RN that patient attempted to get out of bed without assistance and fell to the floor with concern of hitting his head. Evaluated patient, appears neurologically stable. Will order stat head CT due to nature of fall.   Jim Like, DO [facility] [phone]  If 7pm- 7am, please page neurology on call as listed in [place].

## 2015-06-07 NOTE — Progress Notes (Addendum)
STROKE TEAM PROGRESS NOTE   HISTORY Mike Rowe is an 59 y.o. male with no reported past medical history (hasn't been to doctors in years) presents with acute onset of slurred speech and left sided weakness. LSW 2130 when he noted he was unable to stand or use his left side. EMS called, upon arrival noted BP of 240/140. Code stroke activated. CT head imaging reviewed, shows right sided thalamic/BG ICH. Denies taking any blood thinners.   He denies any past medical history including hypertension. Denies any surgical history. Denies alcohol or drug abuse.    Date last known well: 06/05/2015 Time last known well: 2130 tPA Given: no, ICH Modified Rankin: 0 ICH Score: 0  Initial NIHSS of 12   SUBJECTIVE (INTERVAL HISTORY) He is extremely agitated this morning.  [place] out of bed and now has a Air cabin crew.  Overall he feels his condition is worse due to hemiparesis   OBJECTIVE Temp:  [97.3 F (36.3 C)-98.1 F (36.7 C)] 98.1 F (36.7 C) (07/24 0700) Pulse Rate:  [70-100] 85 (07/24 0800) Cardiac Rhythm:  [-] Normal sinus rhythm (07/24 0800) Resp:  [14-35] 18 (07/24 0800) BP: (130-179)/(66-103) 150/85 mmHg (07/24 0800) SpO2:  [83 %-100 %] 96 % (07/24 0800)  No results for input(s): GLUCAP in the last 168 hours.  Recent Labs Lab 06/05/15 2227 06/05/15 2234  NA 138 143  K 3.8 3.7  CL 109 108  CO2 21*  --   GLUCOSE 121* 122*  BUN 21* 26*  CREATININE 1.13 1.10  CALCIUM 9.5  --     Recent Labs Lab 06/05/15 2227  AST 186*  ALT 208*  ALKPHOS 75  BILITOT 0.7  PROT 7.1  ALBUMIN 3.8    Recent Labs Lab 06/05/15 2227 06/05/15 2234 06/07/15 0235  WBC 5.8  --  7.0  NEUTROABS 3.5  --   --   HGB 15.8 16.3 14.9  HCT 45.0 48.0 43.1  MCV 92.6  --  91.5  PLT 69*  --  74*   No results for input(s): CKTOTAL, CKMB, CKMBINDEX, TROPONINI in the last 168 hours.  Recent Labs  06/05/15 2227  LABPROT 14.5  INR 1.11   No results for input(s): COLORURINE, LABSPEC, PHURINE,  GLUCOSEU, HGBUR, BILIRUBINUR, KETONESUR, PROTEINUR, UROBILINOGEN, NITRITE, LEUKOCYTESUR in the last 72 hours.  Invalid input(s): APPERANCEUR  No results found for: CHOL, TRIG, HDL, CHOLHDL, VLDL, LDLCALC No results found for: HGBA1C    Component Value Date/Time   LABOPIA NONE DETECTED 06/06/2015 0100   COCAINSCRNUR POSITIVE* 06/06/2015 0100   LABBENZ NONE DETECTED 06/06/2015 0100   AMPHETMU NONE DETECTED 06/06/2015 0100   THCU NONE DETECTED 06/06/2015 0100   LABBARB NONE DETECTED 06/06/2015 0100    No results for input(s): ETH in the last 168 hours.  Imaging   Ct Head Wo Contrast 06/05/2015    Acute right thalamic hemorrhage.     Ct Head Wo Contrast - ( Pre Fall ) 06/06/2015    Increase in size of right thalamic/posterior right internal capsule hematoma now with maximal transverse dimension 2.3 x 2.2 cm versus prior 2.1 x 1.6 cm.  Increase surrounding vasogenic edema and local mass effect with mild compression the right lateral ventricle without evidence of midline shift. Interval development of blood within the deep dependent aspect of the right lateral ventricle consistent with breakthrough of the right thalamic hemorrhage into the right lateral ventricle.   Ct Head Wo Contrast - ( Post fall ) 06/06/2015 Right for thalamic hemorrhage with a small  amount of intraventricular blood appears unchanged. No new abnormality is seen.    PHYSICAL EXAM Neurologic Examination: Mental Status: Alert, oriented, thought content appropriate. Speech fluent without evidence of aphasia. Moderate dysarthria. Able to follow 3 step commands without difficulty. Cranial Nerves: II: funduscopic exam wnl bilaterally, visual fields grossly normal, pupils equal, round, reactive to light and accommodation III,IV, VI: ptosis not present, extra-ocular motions intact bilaterally V,VII: left facial weakness, facial light touch sensation normal bilaterally VIII: hearing normal bilaterally IX,X: gag  reflex present XI: trapezius strength/neck flexion strength normal bilaterally XII: tongue strength normal  Motor: left sided weakness is significant  Sensory: decreased LT and PP on left side Deep Tendon Reflexes: 2+ and symmetric throughout Plantars: Right: downgoingLeft: downgoing Cerebellar: Normal FTN and HTS on right side Gait: deferred   ASSESSMENT/PLAN [name] is a 59 y.o. male with no significant past medical history no regular medical follow-up presenting with left hemiparesis really elevated blood pressures. He did not receive IV t-PA due to acute right thalamic hemorrhage.   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  Resultant  left hemiparesis  MRI  not performed  MRA  not performed   Carotid Doppler  not indicated  2D Echo  pending  LDL not indicated  HgbA1c pending  SCDs for VTE prophylaxis DIET DYS 2 Room service appropriate?: Yes; Fluid consistency:: Thin  no antithrombotic prior to admission, now on no antithrombotic secondary to hemorrhage  Ongoing aggressive stroke risk factor management  Therapy recommendations: Pending - patient passed barium swallow with speech therapy.  Disposition:  Pending  Hypertension  Home meds:  No antihypertensives medications prior to admission  Stable   Other Stroke Risk Factors  Patient sleeping deeply after ativan.  Will obtain history for Cigarette smoking and alcohol use  Substance abuse  Other Active Problems  Thrombocytopenia  Elevated liver function tests  Anxiety / agitation  UDS positive for cocaine  Mildly elevated BUN  Patient fell out of bed Sunday, 06/07/2015 - patient now has a Actuary. Repeat head CT performed. See above.  Passed Modified barium swallow with speech on Saturday.  Other Pertinent History  Substance abuse  ETOH use / abuse   PLAN  IV Ativan prn for anxiety  Repeat head CT in  a.m.  Continue to treat hypertension with IV medications until swallowing has been cleared.  Await 2-D echo, hemoglobin A1c, repeat CBC, CMP,  PT, PTT, and TSH,   Modified barium swallow today with speech  CIWA protocol add thiamin and folate  Decrease BP parameters < 140 - add Lotensin and metoprolol PO - wean Cleviprex as tolerated  Critical care hyperglycemia protocol  Case manager consult   Hospital day # 2  [name] PA-C [facility] Pager [phone] 06/07/2015, 9:24 AM  ATTENDING NOTE: Patient was seen and examined by me personally. Documentation accurately reflects findings. The laboratory and radiographic studies reviewed by me. ROS:  pertinent positives include anxiety. Assessment and plan completed by me personally and fully documented above.  Plans include:   Continuous cardiopulmonary monitoring CT scan reviewed.  No SDH after fall from bed; prior to fall CT revealed hematoma expansion; will repeat CT Treat malignant hypertension with Cleviprex; now cleared for oral medications.  Started Lisinopril and Metoprolol Ativan for anxiety.  CIWA protocol started Echo ordered TSH ordered in the setting of malignant hypertension Blood glucose critical care protocol started Thiamine and Folate to avoid Wernicke's PT/OT Condition is unchanged.  Critical due to  risk for worsening cerebral edema; ICH SIGNED BY: Dr. Elissa Hefty         To contact Stroke Continuity provider, please refer to [email or web address] After hours, contact General Neurology

## 2015-06-07 NOTE — Progress Notes (Signed)
At 2245 pt awoke stating he had "to [facility]  Voided in urinal, then became verbally aggressive, yelling and screaming " I want my underwear", denied being at [facility] but was at home and wanted his underwear.  Attempting to get out of bed, grabbing at staff.  Confused to place, uncooperative initially in stating name and date, stating "No more questions"  Explained to pt the  importance of answering questions and he did eventually correctly state name, date, but is still confused to place, stating he is at home.  Pupils 77mm and PERL.  Dr Janann Colonel notified of pt's confusion, increased agitation.  Given ativan 1 mg per order.   Effective in getting pt to sleep.  Bed alarm, sitter at bedside

## 2015-06-07 NOTE — Evaluation (Signed)
Speech Language Pathology Evaluation Patient Details Name: Mike Rowe MRN: [id number] DOB: Dec 29, 1955 Today's Date: 06/07/2015 Time: 1308-6578 SLP Time Calculation (min) (ACUTE ONLY): 28 min  Problem List:  Patient Active Problem List   Diagnosis Date Noted  . ICH (intracerebral hemorrhage) 06/05/2015   Past Medical History: No past medical history on file. Past Surgical History: No past surgical history on file. HPI:  Mike Rowe is an 59 y.o. male with no reported past medical history (hasn't been to doctors in years) presents with acute onset of slurred speech and left sided weakness. LSW 2130 when he noted he was unable to stand or use his left side. EMS called, upon arrival noted BP of 240/140. Code stroke activated. CT head imaging reviewed, shows right sided thalamic/BG ICH. Denies taking any blood thinners   Assessment / Plan / Recommendation Clinical Impression  Pt exhibiting moderate cognitive impairments which are anticipated to be worse than baseline, however pt denies any family or friends in the area to confirm premorbid baseline and has proven to be an unreliable historian as he lacks insight to deficits. 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 Unable to administer formal cognitive linguistic screener secondary to patients agitation levels  at both the verbal and physical level. Pt with history of substance abuse and noncompliance with preventive medicine and routine health visits. Concern for continued noncompliance with future medicine managment recommendations. Vision appears intact with no observable neglect. Pts cognitive deficits characterized by decreased insight, decreased self monitoring, decreased safety awareness, decreased problem solving and decreased executive function. RN reports fall this am with pt attempting to ambulate independently. Pt is unsafe to return home independently, recommend  another level of care including inpatient rehab or SNF. ST to continue intervention for safety awareness education and implementation of safe swallow strategies.       SLP Assessment  Patient needs continued Speech Lanaguage Pathology Services    Follow Up Recommendations  Inpatient Rehab;Skilled Nursing facility    Frequency and Duration min 2x/week  2 weeks   Pertinent Vitals/Pain Pain Assessment: Faces Faces Pain Scale: Hurts little more Pain Intervention(s): Repositioned;Utilized relaxation techniques   SLP Goals  Potential to Achieve Goals (ACUTE ONLY): Fair Potential Considerations (ACUTE ONLY): Co-morbidities;Cooperation/participation level  SLP Evaluation Prior Functioning  Cognitive/Linguistic Baseline:  (anticipate premorbid cogntive deficits; however no family ) Baseline deficit details: poor insight, poor decision making   Lives With: Alone Available Help at Discharge: Other (Comment) (Pt denies any family or friends available to help at DC ) Education: 9th grade  Vocation: Other (comment) ("I fix stuff, painting")   Cognition  Overall Cognitive Status: Impaired/Different from baseline Arousal/Alertness: Lethargic Orientation Level: Oriented X4 Attention: Sustained;Focused Focused Attention: Impaired Sustained Attention: Impaired Memory: Impaired Memory Impairment: Decreased recall of new information Awareness: Impaired Problem Solving: Impaired Problem Solving Impairment: Verbal basic;Functional basic Executive Function: Reasoning;Decision Making;Self Monitoring Reasoning: Impaired Decision Making: Impaired Self Monitoring: Impaired Behaviors: Restless;Verbal agitation;Physical agitation;Poor frustration tolerance;Impulsive Safety/Judgment: Impaired    Comprehension  Auditory Comprehension Overall Auditory Comprehension: Appears within functional limits for tasks assessed Yes/No Questions: Within Functional Limits Reading Comprehension Reading Status:  Within funtional limits    Expression Expression Primary Mode of Expression: Verbal Verbal Expression Pragmatics: Impairment Impairments: Monotone;Topic appropriateness Written Expression Dominant Hand: Right   Oral / Motor Oral Motor/Sensory Function Overall Oral Motor/Sensory Function: Impaired Labial ROM: Reduced left Labial Symmetry: Abnormal symmetry left Labial Strength: Reduced Labial Sensation: Reduced  Lingual ROM: Reduced left Lingual Symmetry: Abnormal symmetry left Lingual Strength: Reduced Lingual Sensation: Reduced Facial ROM: Reduced left Motor Speech Overall Motor Speech: Appears within functional limits for tasks assessed Respiration: Within functional limits Phonation: Normal   GO    Mike Chaco MA, CCC-SLP Acute Care Speech Language Pathologist    Mike Rowe 06/07/2015, 9:47 AM

## 2015-06-08 ENCOUNTER — Inpatient Hospital Stay (HOSPITAL_COMMUNITY): Payer: Medicaid Other

## 2015-06-08 DIAGNOSIS — I6789 Other cerebrovascular disease: Secondary | ICD-10-CM

## 2015-06-08 DIAGNOSIS — F10230 Alcohol dependence with withdrawal, uncomplicated: Secondary | ICD-10-CM | POA: Insufficient documentation

## 2015-06-08 LAB — COMPREHENSIVE METABOLIC PANEL
ALT: 216 U/L — ABNORMAL HIGH (ref 17–63)
ANION GAP: 11 (ref 5–15)
AST: 173 U/L — ABNORMAL HIGH (ref 15–41)
Albumin: 3.7 g/dL (ref 3.5–5.0)
Alkaline Phosphatase: 82 U/L (ref 38–126)
BILIRUBIN TOTAL: 1.1 mg/dL (ref 0.3–1.2)
BUN: 14 mg/dL (ref 6–20)
CALCIUM: 8.8 mg/dL — AB (ref 8.9–10.3)
CHLORIDE: 104 mmol/L (ref 101–111)
CO2: 18 mmol/L — ABNORMAL LOW (ref 22–32)
Creatinine, Ser: 0.9 mg/dL (ref 0.61–1.24)
GFR calc non Af Amer: >60 mL/min (ref >60)
GLUCOSE: 118 mg/dL — AB (ref 65–99)
POTASSIUM: 4.5 mmol/L (ref 3.5–5.1)
SODIUM: 133 mmol/L — AB (ref 135–145)
Total Protein: 7.7 g/dL (ref 6.5–8.1)

## 2015-06-08 LAB — GLUCOSE, CAPILLARY
GLUCOSE-CAPILLARY: 109 mg/dL — AB (ref 65–99)
Glucose-Capillary: 110 mg/dL — ABNORMAL HIGH (ref 65–99)
Glucose-Capillary: 99 mg/dL (ref 65–99)
Glucose-Capillary: 111 mg/dL — ABNORMAL HIGH (ref 65–99)
Glucose-Capillary: 110 mg/dL — ABNORMAL HIGH (ref 65–99)

## 2015-06-08 LAB — CBC
HEMATOCRIT: 48 % (ref 39.0–52.0)
Hemoglobin: 16.9 g/dL (ref 13.0–17.0)
MCH: 32.4 pg (ref 26.0–34.0)
MCHC: 35.2 g/dL (ref 30.0–36.0)
MCV: 92.1 fL (ref 78.0–100.0)
PLATELETS: 103 10*3/uL — AB (ref 150–400)
RBC: 5.21 MIL/uL (ref 4.22–5.81)
RDW: 13.4 % (ref 11.5–15.5)
WBC: 12.2 10*3/uL — AB (ref 4.0–10.5)

## 2015-06-08 LAB — HEMOGLOBIN A1C
Hgb A1c MFr Bld: 5.2 % (ref 4.8–5.6)
Mean Plasma Glucose: 103 mg/dL

## 2015-06-08 MED ORDER — ENOXAPARIN SODIUM 30 MG/0.3ML ~~LOC~~ SOLN
30.0000 mg | SUBCUTANEOUS | Status: DC
  Administered 2015-06-08 – 2015-06-11 (×3): 30 mg via SUBCUTANEOUS
  Filled 2015-06-08 (×4): qty 0.3

## 2015-06-08 MED ORDER — HALOPERIDOL LACTATE 5 MG/ML IJ SOLN
5.0000 mg | Freq: Once | INTRAMUSCULAR | Status: AC | PRN
  Administered 2015-06-08: 5 mg via INTRAVENOUS

## 2015-06-08 MED ORDER — LORAZEPAM 2 MG/ML IJ SOLN
1.0000 mg | Freq: Once | INTRAMUSCULAR | Status: AC | PRN
  Administered 2015-06-08: 1 mg via INTRAVENOUS

## 2015-06-08 NOTE — Progress Notes (Signed)
Pt had another combative episode, swinging his right fist, kicking with his right leg, verbally abusive to staff during blood draw.   Pt pulled out IV.  New 20 gu  restarted in LUE.   Haldol 5 mg IV given.,  Remains confused to place.  Dr Janann Colonel notified of pt status and also of CT report of increase in head bleed on this mornings CT.  Bleed  Today 3.2 x 2.3 with increased mass effect.  Bleed on 7/24 was 2.3 x 2.2.  No new orders received.

## 2015-06-08 NOTE — Progress Notes (Signed)
For the past hour patient has been combative, hitting RUE against padded bedrail causing loss of IV.  Restarted in left UE, kicking with restrainted RUE, vervbally abuseive, diaphoretic, disoriented to place and time, SBP up to 180s.  Dr Janann Colonel notified of st status.  Aivan 1 mg IV given at 0645.  Pt rebathed with complete linen change.

## 2015-06-08 NOTE — Clinical Social Work Note (Signed)
Clinical Social Worker received referral for transportation needs.  At this time, patient is currently combative and disoriented in the intensive care unit.  CSW will sign off.  Please reconsult when patient more appropriate to address discharge needs.  Barbette Or, [place]

## 2015-06-08 NOTE — Care Management Note (Signed)
Case Management Note  Patient Details  Name: Mike Rowe MRN: [id number] Date of Birth: Dec 08, 1955  Subjective/Objective:   Pt admitted on 06/05/15 with Rt basal ganglia ICH.  PTA, pt independent of ADLS.  No family,per report, and none listed in chart.                   Action/Plan: Pt agitated and combative this am.  Will need PT/OT consults when able to tolerate.  Received referral for SNF/rehab; consult passed to CSW.  Will cont to follow progress.    Expected Discharge Date:                  Expected Discharge Plan:  Skilled Nursing Facility  In-House Referral:  Clinical Social Work  Discharge planning Services  CM Consult  Post Acute Care Choice:    Choice offered to:     DME Arranged:    DME Agency:     HH Arranged:    [place] Agency:     Status of Service:  In process, will continue to follow  Medicare Important Message Given:    Date Medicare IM Given:    Medicare IM give by:    Date Additional Medicare IM Given:    Additional Medicare Important Message give by:     If discussed at [place] of Stay Meetings, dates discussed:    Additional Comments:  Ella Bodo, RN 06/08/2015, 11:32 AM

## 2015-06-08 NOTE — Progress Notes (Signed)
Speech Language Pathology Treatment: Dysphagia;Cognitive-Linquistic  Patient Details Name: Mike Rowe MRN: [id number] DOB: [birth year] Today's Date: 06/08/2015 Time: 2820-6015 SLP Time Calculation (min) (ACUTE ONLY): 9 min  Assessment / Plan / Recommendation Clinical Impression  Pt requires Max cues for brief periods of sustained attention for PO trials, with difficulty attending to initiate intake. Once he begins to get thin liquids, he tries to impulsively swallow large amounts at a time. SLP provided Mod-Max A for appropriate pacing. Pt believes he is at home and repetitively asks to "open the refrigerator door". SLP provided Total A for increased orientation.   HPI Other Pertinent Information: Mike Rowe is an 59 y.o. male with no reported past medical history (hasn't been to doctors in years) presents with acute onset of slurred speech and left sided weakness. LSW 2130 when he noted he was unable to stand or use his left side. EMS called, upon arrival noted BP of 240/140. Code stroke activated. CT head imaging reviewed, shows right sided thalamic/BG ICH. Denies taking any blood thinners   Pertinent Vitals Pain Assessment: Faces Faces Pain Scale: No hurt  SLP Plan  Continue with current plan of care    Recommendations Diet recommendations: Dysphagia 2 (fine chop);Thin liquid Liquids provided via: Cup Medication Administration: Whole meds with liquid Supervision: Full supervision/cueing for compensatory strategies;Staff to assist with self feeding;Patient able to self feed Compensations: Check for anterior loss;Check for pocketing;Small sips/bites;Slow rate;Minimize environmental distractions;Follow solids with liquid Postural Changes and/or Swallow Maneuvers: Seated upright 90 degrees       Oral Care Recommendations: Oral care BID Follow up Recommendations: Skilled Nursing facility Plan: Continue with current plan of care    Germain Osgood, M.A. CCC-SLP [phone]  Germain Osgood 06/08/2015, 1:39 PM

## 2015-06-08 NOTE — Progress Notes (Signed)
  Echocardiogram 2D Echocardiogram has been performed.  Darlina Sicilian M 06/08/2015, 10:10 AM

## 2015-06-08 NOTE — Progress Notes (Signed)
Physical Therapy Treatment Patient Details Name: Mike Rowe MRN: [id number] DOB: 02/24/1956 Today's Date: 06/08/2015    History of Present Illness Mike Rowe is an 59 y.o. male with no reported past medical history (hasn't been to doctors in years) presents with acute onset of slurred speech and left sided weakness. LSW 2130 when he noted he was unable to stand or use his left side. EMS called, upon arrival noted BP of 240/140. Code stroke activated. CT head imaging reviewed, shows right sided thalamic/BG ICH. Denies taking any blood thinners    PT Comments    Limited by pt combativeness to bed mobility and he refused to do more.  Follow Up Recommendations  SNF;Supervision/Assistance - 24 hour     Equipment Recommendations  Other (comment) (TBA)    Recommendations for Other Services       Precautions / Restrictions Precautions Precautions: Fall    Mobility  Bed Mobility Overal bed mobility: Needs Assistance Bed Mobility: Supine to Sit;Sit to Supine     Supine to sit: Total assist;+2 for physical assistance Sit to supine: Mod assist;+2 for safety/equipment   General bed mobility comments: Pt did not attempt to move to EOB - required assist with all aspects.  He moved trunk and Rt LE back on to bed to return to supine   Transfers                 General transfer comment: unable   Ambulation/Gait                 Stairs            Wheelchair Mobility    Modified Rankin (Stroke Patients Only) Modified Rankin (Stroke Patients Only) Modified Rankin: Severe disability     Balance Overall balance assessment: Needs assistance Sitting-balance support: Feet supported Sitting balance-Leahy Scale: Zero Sitting balance - Comments: Pt refused to move to EOB                             Cognition Arousal/Alertness: Lethargic Behavior During Therapy: Agitated Overall Cognitive Status: Impaired/Different from baseline Area of Impairment:  Orientation;Attention;Memory;Following commands;Safety/judgement;Awareness;Problem solving Orientation Level: Disoriented to;Person;Place;Time;Situation Current Attention Level: Focused   Following Commands:  (did not follow commands ) Safety/Judgement: Decreased awareness of safety;Decreased awareness of deficits   Problem Solving: Decreased initiation;Difficulty sequencing;Requires verbal cues;Requires tactile cues General Comments: Pt keeps eyes closed throughout eval.  Pt sates his name is Mike Rowe.  He followed no commands, and did not answer questions.  Pt moved to EOB, and began cursing, kicking, and swinging - unable to redirect with max cues/assist.       Exercises      General Comments General comments (skin integrity, edema, etc.): Pt moved to EOB, and became very agitated, kicking, swinging, and swearing. He refused to remain EOB and forcefully returned to supine       Pertinent Vitals/Pain Faces Pain Scale: No hurt    Home Living                      Prior Function            PT Goals (current goals can now be found in the care plan section) Acute Rehab PT Goals Patient Stated Goal: to get bettter PT Goal Formulation: With patient Time For Goal Achievement: 06/20/15 Potential to Achieve Goals: Good Progress towards PT goals: Progressing toward goals    Frequency  Min 3X/week  PT Plan Current plan remains appropriate    Co-evaluation PT/OT/SLP Co-Evaluation/Treatment: Yes Reason for Co-Treatment: Complexity of the patient's impairments (multi-system involvement) PT goals addressed during session: Mobility/safety with mobility       End of Session   Activity Tolerance: Treatment limited secondary to agitation Patient left: in bed;with call bell/phone within reach;with bed alarm set     Time: 1255-1316 PT Time Calculation (min) (ACUTE ONLY): 21 min  Charges:  $Therapeutic Activity: 8-22 mins                    G Codes:      Amous Crewe,  Tessie Fass 06/08/2015, 5:45 PM  06/08/2015  Donnella Sham, [place] [phone]  (pager)

## 2015-06-08 NOTE — Evaluation (Signed)
Occupational Therapy Evaluation Patient Details Name: Jahel Wavra MRN: [id number] DOB: January 19, 1956 Today's Date: 06/08/2015    History of Present Illness Vipul Cafarelli is an 59 y.o. male with no reported past medical history (hasn't been to doctors in years) presents with acute onset of slurred speech and left sided weakness. LSW 2130 when he noted he was unable to stand or use his left side. EMS called, upon arrival noted BP of 240/140. Code stroke activated. CT head imaging reviewed, shows right sided thalamic/BG ICH. Denies taking any blood thinners   Clinical Impression   Pt admitted with above. He demonstrates the below listed deficits and will benefit from continued OT to maximize safety and independence with BADLs.  Pt with limited activity due to increased agitation with attempts to move pt to EOB.   He required total A for all aspects of ADLs and functional mobility as he was unable/would not attempt to participate.  Anticipate he will need SNF.       Follow Up Recommendations  SNF    Equipment Recommendations  None recommended by OT    Recommendations for Other Services       Precautions / Restrictions Precautions Precautions: Fall      Mobility Bed Mobility Overal bed mobility: Needs Assistance Bed Mobility: Supine to Sit;Sit to Supine     Supine to sit: Total assist;+2 for physical assistance Sit to supine: Mod assist;+2 for safety/equipment   General bed mobility comments: Pt did not attempt to move to EOB - required assist with all aspects.  He moved trunk and Rt LE back on to bed to return to supine   Transfers                 General transfer comment: unable     Balance Overall balance assessment: Needs assistance Sitting-balance support: Feet supported Sitting balance-Leahy Scale: Zero Sitting balance - Comments: Pt refused to move to EOB                                     ADL Overall ADL's : Needs assistance/impaired                                        General ADL Comments: Pt requires total A for all.  He adamantly refused to attempt to engage in activity      Vision Additional Comments: Unable to assess due to decreased participation    Perception Perception Comments: unable to assess   Praxis Praxis Praxis-Other Comments: unable to assess     Pertinent Vitals/Pain Pain Assessment: Faces Faces Pain Scale: No hurt     Hand Dominance Right   Extremity/Trunk Assessment Upper Extremity Assessment Upper Extremity Assessment: LUE deficits/detail;RUE deficits/detail RUE Deficits / Details: Pt spontaneously moves Rt UE LUE Deficits / Details: Pt keeps Lt elbow and hand flexed.  He appears to actively resist attempted to extend elbow - unable to determine if it is active resistance vs increased tone.   LUE Coordination: decreased fine motor;decreased gross motor   Lower Extremity Assessment Lower Extremity Assessment: Defer to PT evaluation       Communication Communication Communication: No difficulties   Cognition Arousal/Alertness: Lethargic Behavior During Therapy: Agitated Overall Cognitive Status: Impaired/Different from baseline Area of Impairment: Orientation;Attention;Memory;Following commands;Safety/judgement;Awareness;Problem solving Orientation Level: Disoriented to;Person;Place;Time;Situation Current Attention Level: Focused  Following Commands:  (did not follow commands ) Safety/Judgement: Decreased awareness of safety;Decreased awareness of deficits   Problem Solving: Decreased initiation;Difficulty sequencing;Requires verbal cues;Requires tactile cues General Comments: Pt keeps eyes closed throughout eval.  Pt sates his name is [name].  He followed no commands, and did not answer questions.  Pt moved to EOB, and began cursing, kicking, and swinging - unable to redirect with max cues/assist.      General Comments       Exercises       Shoulder Instructions       Home Living Family/patient expects to be discharged to:: Private residence Living Arrangements: Alone                               Additional Comments: Information above gleaned from chart review.  Pt unable/would not provide any info when asked.       Prior Functioning/Environment          Comments: Pt unable to provide info, but per chart, he lived alone     OT Diagnosis: Generalized weakness;Cognitive deficits;Hemiplegia non-dominant side   OT Problem List: Decreased strength;Decreased activity tolerance;Decreased range of motion;Impaired balance (sitting and/or standing);Impaired vision/perception;Decreased coordination;Decreased cognition;Decreased safety awareness;Decreased knowledge of use of DME or AE;Decreased knowledge of precautions;Impaired sensation;Impaired tone;Impaired UE functional use   OT Treatment/Interventions: Self-care/ADL training;Neuromuscular education;DME and/or AE instruction;Therapeutic activities;Manual therapy;Cognitive remediation/compensation;Visual/perceptual remediation/compensation;Patient/family education;Balance training    OT Goals(Current goals can be found in the care plan section) Acute Rehab OT Goals OT Goal Formulation: Patient unable to participate in goal setting Time For Goal Achievement: 06/22/15 Potential to Achieve Goals: Fair ADL Goals Pt Will Perform Grooming: with min assist;sitting Pt Will Transfer to Toilet: with mod assist;with +2 assist;stand pivot transfer;bedside commode Pt Will Perform Toileting - Clothing Manipulation and hygiene: with mod assist;bed level Additional ADL Goal #1: Pt will maintain sustained attention x 4 mins during simple grooming tasks   OT Frequency: Min 2X/week   Barriers to D/C: Decreased caregiver support          Co-evaluation PT/OT/SLP Co-Evaluation/Treatment: Yes Reason for Co-Treatment: Complexity of the patient's impairments (multi-system involvement);For  patient/therapist safety   OT goals addressed during session: Strengthening/ROM      End of Session Nurse Communication: Mobility status  Activity Tolerance: Other (comment) (agitation) Patient left: in bed;with call bell/phone within reach;with nursing/sitter in room;with restraints reapplied   Time: 1255-1316 OT Time Calculation (min): 21 min Charges:  OT General Charges $OT Visit: 1 Procedure OT Evaluation $Initial OT Evaluation Tier I: 1 Procedure G-Codes:    Basya Casavant, Ellard Artis M 06/11/15, 2:10 PM

## 2015-06-08 NOTE — Progress Notes (Signed)
Combative again, slugging right fist and kicking right leg at staff, attempting to bite staff .  Mitten placed on right hand.   SR 96      168/101   Cleviprex increased to 12 mg/ hr           RR 21  O2 sat 99% RA   Dr Janann Colonel informed of event.  Orders received for RUE and RLE soft restraint.  Applied to pt.

## 2015-06-08 NOTE — Progress Notes (Signed)
STROKE TEAM PROGRESS NOTE   HISTORY Mike Rowe is an 59 y.o. male with no reported past medical history (hasn't been to doctors in years) presents with acute onset of slurred speech and left sided weakness. LSW 2130 when he noted he was unable to stand or use his left side. EMS called, upon arrival noted BP of 240/140. Code stroke activated. CT head imaging reviewed, shows right sided thalamic/BG ICH. Denies taking any blood thinners.  He denies any past medical history including hypertension. Denies any surgical history. Denies alcohol or drug abuse.  Date last known well: 06/05/2015 Time last known well: 2130 tPA Given: no, ICH Modified Rankin: 0 ICH Score: 0  Initial NIHSS of 12   SUBJECTIVE (INTERVAL HISTORY) He is drowsy this morning. He is still on alcohol withdrawal precaution protocol  CT head this am shows the right thalamic hemorrhage has increased in size. It currently measures 3.2 x 2.3 cm and previously measured 2.3 x 2.2 cm. There is mildly worsened mass effect on the third ventricle. There is slight increase in volume of intraventricular blood within the right occipital horn. OBJECTIVE Temp:  [98.1 F (36.7 C)-99.1 F (37.3 C)] 98.9 F (37.2 C) (07/25 0735) Pulse Rate:  [73-107] 82 (07/25 1200) Cardiac Rhythm:  [-] Normal sinus rhythm (07/25 0800) Resp:  [14-33] 18 (07/25 1200) BP: (110-171)/(68-130) 134/79 mmHg (07/25 1200) SpO2:  [88 %-100 %] 94 % (07/25 1200)   Recent Labs Lab 06/07/15 1920 06/07/15 2303 06/08/15 0331 06/08/15 0734 06/08/15 1203  GLUCAP 106* 107* 110* 99 109*    Recent Labs Lab 06/05/15 2227 06/05/15 2234 06/07/15 0907 06/08/15 0215  NA 138 143 135 133*  K 3.8 3.7 3.9 4.5  CL 109 108 105 104  CO2 21*  --  21* 18*  GLUCOSE 121* 122* 107* 118*  BUN 21* 26* 13 14  CREATININE 1.13 1.10 0.82 0.90  CALCIUM 9.5  --  8.7* 8.8*  MG  --   --  2.1  --   PHOS  --   --  3.1  --     Recent Labs Lab 06/05/15 2227 06/07/15 0907  06/08/15 0215  AST 186* 180* 173*  ALT 208* 211* 216*  ALKPHOS 75 76 82  BILITOT 0.7 0.9 1.1  PROT 7.1 7.4 7.7  ALBUMIN 3.8 3.6 3.7    Recent Labs Lab 06/05/15 2227 06/05/15 2234 06/07/15 0235 06/08/15 0215  WBC 5.8  --  7.0 12.2*  NEUTROABS 3.5  --   --   --   HGB 15.8 16.3 14.9 16.9  HCT 45.0 48.0 43.1 48.0  MCV 92.6  --  91.5 92.1  PLT 69*  --  74* 103*   No results for input(s): CKTOTAL, CKMB, CKMBINDEX, TROPONINI in the last 168 hours.  Recent Labs  06/05/15 2227 06/07/15 1050  LABPROT 14.5 14.3  INR 1.11 1.09   No results for input(s): COLORURINE, LABSPEC, PHURINE, GLUCOSEU, HGBUR, BILIRUBINUR, KETONESUR, PROTEINUR, UROBILINOGEN, NITRITE, LEUKOCYTESUR in the last 72 hours.  Invalid input(s): APPERANCEUR  No results found for: CHOL, TRIG, HDL, CHOLHDL, VLDL, LDLCALC Lab Results  Component Value Date   HGBA1C 5.2 06/07/2015      Component Value Date/Time   LABOPIA NONE DETECTED 06/06/2015 0100   COCAINSCRNUR POSITIVE* 06/06/2015 0100   LABBENZ NONE DETECTED 06/06/2015 0100   AMPHETMU NONE DETECTED 06/06/2015 0100   THCU NONE DETECTED 06/06/2015 0100   LABBARB NONE DETECTED 06/06/2015 0100    No results for input(s): ETH in the last  168 hours.  Imaging   Ct Head Wo Contrast 06/05/2015    Acute right thalamic hemorrhage.     Ct Head Wo Contrast - ( Pre Fall ) 06/06/2015    Increase in size of right thalamic/posterior right internal capsule hematoma now with maximal transverse dimension 2.3 x 2.2 cm versus prior 2.1 x 1.6 cm.  Increase surrounding vasogenic edema and local mass effect with mild compression the right lateral ventricle without evidence of midline shift. Interval development of blood within the deep dependent aspect of the right lateral ventricle consistent with breakthrough of the right thalamic hemorrhage into the right lateral ventricle.   Ct Head Wo Contrast - ( Post fall ) 06/06/2015 Right for thalamic hemorrhage with a small  amount of intraventricular blood appears unchanged. No new abnormality is seen.    PHYSICAL EXAM Neurologic Examination: Mental Status: Drowsy can barely opens eyes. Speech fluent without evidence of aphasia. Moderate dysarthria. Able to follow 3 step commands without difficulty. Cranial Nerves: II: funduscopic exam not done bilaterally, visual fields cannot be reliably tested, pupils equal, round, reactive to light and accommodation III,IV, VI: ptosis not present, extra-ocular motions intact bilaterally V,VII: left facial weakness, facial light touch sensation normal bilaterally VIII: hearing normal bilaterally IX,X: gag reflex present XI: trapezius strength/neck flexion strength normal bilaterally XII: tongue strength normal  Motor:  Dense left hemiplegia arm more than leg.  Sensory: decreased LT and PP on left side Deep Tendon Reflexes: 2+ and symmetric throughout Plantars: Right: downgoingLeft: downgoing Cerebellar: Normal FTN and HTS on right side Gait: deferred   ASSESSMENT/PLAN [name] is a 59 y.o. male with no significant past medical history no regular medical follow-up presenting with left hemiparesis really elevated blood pressures. He did not receive IV t-PA due to acute right thalamic hemorrhage.   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  Resultant  left hemiparesis  MRI  not performed  MRA  not performed   Carotid Doppler  not indicated  2D Echo Left ventricle: The cavity size was normal. Wall thickness was increased in a pattern of mild LVH. Systolic function was normal. The estimated ejection fraction was in the range of 60% to 65%. Wall motion was normal  LDL not indicated  HgbA1c 5.2  SCDs for VTE prophylaxis DIET DYS 2 Room service appropriate?: Yes; Fluid consistency:: Thin  no antithrombotic prior to admission, now on no antithrombotic secondary to  hemorrhage  Ongoing aggressive stroke risk factor management  Therapy recommendations: Pending - patient passed barium swallow with speech therapy.  Disposition:  Pending  Hypertension  Home meds:  No antihypertensives medications prior to admission  Stable   Other Stroke Risk Factors  Patient sleeping deeply after ativan.  Will obtain history for Cigarette smoking and alcohol use  Substance abuse  Other Active Problems  Thrombocytopenia  Elevated liver function tests  Anxiety / agitation  UDS positive for cocaine  Mildly elevated BUN  Patient fell out of bed Sunday, 06/07/2015 - patient now has a Actuary. Repeat head CT performed. See above.  Passed Modified barium swallow with speech on 06/06/15  Other Pertinent History  Substance abuse  ETOH use / abuse   PLAN  Patient `s hemorrhage is slightly larger in size but he is able to maintain his airway and   avoid intubation and hypertonic saline for now. We need to reconsider if his condition worsens  Continue to treat hypertension with IV medications until swallowing has been cleared.  CIWA protocol  And  thiamin and folate  Decrease BP parameters < 180 - Continue Lotensin and metoprolol PO - wean Cleviprex as tolerated    Hospital day # 3 I have personally examined this patient, reviewed notes, independently viewed imaging studies, participated in medical decision making and plan of care. I have made any additions or clarifications directly to the above note. This patient is critically ill and at significant risk of neurological worsening, death and care requires constant monitoring of vital signs, hemodynamics,respiratory and cardiac monitoring, extensive review of multiple databases, frequent neurological assessment, discussion with family, other specialists and medical decision making of high complexity.I have made any additions or clarifications directly to the above note.This critical care time does not  reflect procedure time, or teaching time or supervisory time of PA/NP/Med Resident etc but could involve care discussion time.  I spent 30 minutes of neurocritical care time  in the care of  this patient.     Antony Contras, MD Medical Director [facility] Pager: [phone] 06/08/2015 1:22 PM           To contact Stroke Continuity provider, please refer to [email or web address] After hours, contact General Neurology

## 2015-06-09 ENCOUNTER — Inpatient Hospital Stay (HOSPITAL_COMMUNITY): Payer: Medicaid Other

## 2015-06-09 DIAGNOSIS — F10230 Alcohol dependence with withdrawal, uncomplicated: Secondary | ICD-10-CM

## 2015-06-09 DIAGNOSIS — I10 Essential (primary) hypertension: Secondary | ICD-10-CM

## 2015-06-09 DIAGNOSIS — I610 Nontraumatic intracerebral hemorrhage in hemisphere, subcortical: Principal | ICD-10-CM

## 2015-06-09 DIAGNOSIS — J9601 Acute respiratory failure with hypoxia: Secondary | ICD-10-CM

## 2015-06-09 LAB — BLOOD GAS, ARTERIAL
Acid-base deficit: 5.3 mmol/L — ABNORMAL HIGH (ref 0.0–2.0)
BICARBONATE: 18.3 meq/L — AB (ref 20.0–24.0)
DRAWN BY: 225631
FIO2: 1
LHR: 16 {breaths}/min
MECHVT: 550 mL
O2 Saturation: 96.5 %
PEEP: 5 cmH2O
PO2 ART: 83.6 mmHg (ref 80.0–100.0)
Patient temperature: 98.6
TCO2: 19.2 mmol/L (ref 0–100)
pCO2 arterial: 28.6 mmHg — ABNORMAL LOW (ref 35.0–45.0)
pH, Arterial: 7.422 (ref 7.350–7.450)

## 2015-06-09 LAB — GLUCOSE, CAPILLARY
GLUCOSE-CAPILLARY: 128 mg/dL — AB (ref 65–99)
GLUCOSE-CAPILLARY: 142 mg/dL — AB (ref 65–99)
Glucose-Capillary: 129 mg/dL — ABNORMAL HIGH (ref 65–99)
Glucose-Capillary: 120 mg/dL — ABNORMAL HIGH (ref 65–99)
Glucose-Capillary: 114 mg/dL — ABNORMAL HIGH (ref 65–99)
Glucose-Capillary: 128 mg/dL — ABNORMAL HIGH (ref 65–99)

## 2015-06-09 LAB — PHOSPHORUS: Phosphorus: 7.8 mg/dL — ABNORMAL HIGH (ref 2.5–4.6)

## 2015-06-09 LAB — SODIUM
SODIUM: 142 mmol/L (ref 135–145)
Sodium: 138 mmol/L (ref 135–145)

## 2015-06-09 LAB — MAGNESIUM: Magnesium: 2.5 mg/dL — ABNORMAL HIGH (ref 1.7–2.4)

## 2015-06-09 MED ORDER — FENTANYL CITRATE (PF) 100 MCG/2ML IJ SOLN
INTRAMUSCULAR | Status: AC
  Administered 2015-06-09: 100 ug
  Filled 2015-06-09: qty 2

## 2015-06-09 MED ORDER — PANTOPRAZOLE SODIUM 40 MG PO PACK
40.0000 mg | PACK | Freq: Every day | ORAL | Status: DC
  Administered 2015-06-09 – 2015-06-15 (×7): 40 mg
  Filled 2015-06-09 (×8): qty 20

## 2015-06-09 MED ORDER — PROPOFOL 1000 MG/100ML IV EMUL
5.0000 ug/kg/min | INTRAVENOUS | Status: DC
  Administered 2015-06-09: 20 ug/kg/min via INTRAVENOUS
  Administered 2015-06-09: 5 ug/kg/min via INTRAVENOUS
  Administered 2015-06-09: 7 ug/kg/min via INTRAVENOUS
  Administered 2015-06-09: 30 ug/kg/min via INTRAVENOUS
  Administered 2015-06-09: 10 ug/kg/min via INTRAVENOUS
  Administered 2015-06-10: 40 ug/kg/min via INTRAVENOUS
  Administered 2015-06-10: 30 ug/kg/min via INTRAVENOUS
  Administered 2015-06-10: 40 ug/kg/min via INTRAVENOUS
  Administered 2015-06-10 – 2015-06-11 (×2): 30 ug/kg/min via INTRAVENOUS
  Administered 2015-06-11 (×2): 50 ug/kg/min via INTRAVENOUS
  Administered 2015-06-11: 15 ug/kg/min via INTRAVENOUS
  Administered 2015-06-12 (×3): 40 ug/kg/min via INTRAVENOUS
  Administered 2015-06-12 – 2015-06-13 (×4): 30 ug/kg/min via INTRAVENOUS
  Administered 2015-06-14: 40 ug/kg/min via INTRAVENOUS
  Administered 2015-06-14: 30 ug/kg/min via INTRAVENOUS
  Administered 2015-06-14: 40 ug/kg/min via INTRAVENOUS
  Administered 2015-06-14: 20 ug/kg/min via INTRAVENOUS
  Administered 2015-06-15: 45 ug/kg/min via INTRAVENOUS
  Filled 2015-06-09 (×24): qty 100

## 2015-06-09 MED ORDER — VITAL AF 1.2 CAL PO LIQD
1000.0000 mL | ORAL | Status: DC
  Administered 2015-06-09 – 2015-06-18 (×9): 1000 mL
  Filled 2015-06-09 (×15): qty 1000

## 2015-06-09 MED ORDER — FENTANYL CITRATE (PF) 100 MCG/2ML IJ SOLN
25.0000 ug | INTRAMUSCULAR | Status: DC | PRN
  Administered 2015-06-09 – 2015-06-11 (×4): 50 ug via INTRAVENOUS
  Administered 2015-06-11 – 2015-06-16 (×19): 100 ug via INTRAVENOUS
  Filled 2015-06-09 (×22): qty 2

## 2015-06-09 MED ORDER — HYDRALAZINE HCL 20 MG/ML IJ SOLN
10.0000 mg | INTRAMUSCULAR | Status: DC | PRN
  Administered 2015-06-10: 10 mg via INTRAVENOUS
  Administered 2015-06-10 – 2015-06-17 (×7): 20 mg via INTRAVENOUS
  Filled 2015-06-09 (×10): qty 1

## 2015-06-09 MED ORDER — MIDAZOLAM HCL 2 MG/2ML IJ SOLN
INTRAMUSCULAR | Status: AC
  Filled 2015-06-09: qty 2

## 2015-06-09 MED ORDER — LIDOCAINE HCL (PF) 1 % IJ SOLN
INTRAMUSCULAR | Status: AC
  Administered 2015-06-09: 13:00:00
  Filled 2015-06-09: qty 5

## 2015-06-09 MED ORDER — ROCURONIUM BROMIDE 50 MG/5ML IV SOLN
50.0000 mg | Freq: Once | INTRAVENOUS | Status: AC
  Administered 2015-06-09: 50 mg via INTRAVENOUS

## 2015-06-09 MED ORDER — FENTANYL CITRATE (PF) 100 MCG/2ML IJ SOLN
100.0000 ug | Freq: Once | INTRAMUSCULAR | Status: DC
  Filled 2015-06-09: qty 2

## 2015-06-09 MED ORDER — SODIUM CHLORIDE 3 % IV SOLN
INTRAVENOUS | Status: DC
  Administered 2015-06-09 – 2015-06-10 (×4): 50 mL/h via INTRAVENOUS
  Filled 2015-06-09 (×19): qty 500

## 2015-06-09 MED ORDER — ETOMIDATE 2 MG/ML IV SOLN
20.0000 mg | Freq: Once | INTRAVENOUS | Status: AC
  Administered 2015-06-09: 20 mg via INTRAVENOUS

## 2015-06-09 MED ORDER — CETYLPYRIDINIUM CHLORIDE 0.05 % MT LIQD
7.0000 mL | Freq: Four times a day (QID) | OROMUCOSAL | Status: DC
  Administered 2015-06-09 – 2015-06-16 (×27): 7 mL via OROMUCOSAL

## 2015-06-09 MED ORDER — ADULT MULTIVITAMIN LIQUID CH
5.0000 mL | Freq: Every day | ORAL | Status: DC
  Administered 2015-06-09 – 2015-06-27 (×18): 5 mL
  Filled 2015-06-09 (×20): qty 5

## 2015-06-09 MED ORDER — VITAL HIGH PROTEIN PO LIQD: 1000.0000 mL | ORAL | Status: DC

## 2015-06-09 MED ORDER — MIDAZOLAM HCL 2 MG/2ML IJ SOLN
2.0000 mg | Freq: Once | INTRAMUSCULAR | Status: AC
  Administered 2015-06-09: 2 mg via INTRAVENOUS

## 2015-06-09 MED ORDER — CHLORHEXIDINE GLUCONATE 0.12 % MT SOLN
15.0000 mL | Freq: Two times a day (BID) | OROMUCOSAL | Status: DC
  Administered 2015-06-09 – 2015-06-16 (×15): 15 mL via OROMUCOSAL
  Filled 2015-06-09 (×13): qty 15

## 2015-06-09 NOTE — Procedures (Signed)
Intubation Procedure Note Dezman Granda [id number] 1956/08/24  Procedure: Intubation Indications: Airway protection and maintenance  Procedure Details Consent: Unable to obtain consent because of emergent medical necessity. Time Out: Verified patient identification, verified procedure, site/side was marked, verified correct patient position, special equipment/implants available, medications/allergies/relevent history reviewed, required imaging and test results available.  Performed  Maximum sterile technique was used including gloves, gown and mask.  MAC    Evaluation Hemodynamic Status: BP stable throughout; O2 sats: stable throughout Patient's Current Condition: stable Complications: No apparent complications Patient did tolerate procedure well. Chest X-ray ordered to verify placement.  CXR: pending.   Jennet Maduro 06/09/2015

## 2015-06-09 NOTE — Progress Notes (Signed)
Initial Nutrition Assessment  DOCUMENTATION CODES:   Severe malnutrition in context of chronic illness  INTERVENTION:   Initiate Vital AF 1.2 @ 20 ml/hr via feeding tube and increase by 10 ml every 12 hours to goal rate of 60 ml/hr.    Tube feeding regimen provides 1728 kcal (96% of needs), 108 grams of protein, and 1167 ml of H2O.    NUTRITION DIAGNOSIS:   Malnutrition related to chronic illness as evidenced by severe depletion of body fat, severe depletion of muscle mass.   GOAL:   Patient will meet greater than or equal to 90% of their needs   MONITOR:   TF tolerance, Vent status, Labs, I & O's  REASON FOR ASSESSMENT:   Consult Enteral/tube feeding initiation and management  ASSESSMENT:   59 year old male with no known PMH who presents on 7/22 with acute onset slurred speech and inability to stand. Code stroke called, hemorrhagic thalamic stroke noted. On 7/26 patient's CT and mental status worsened with cytotoxic edema and shift then was transferred to the ICU for inability to protect his airway. Patient will also need hypertonic saline.  Patient is currently intubated on ventilator support with OG tube MV: 8.8 L/min Temp (24hrs), Avg:98.8 F (37.1 C), Min:98.2 F (36.8 C), Max:99.5 F (37.5 C)  Propofol: off for now due to low BP Labs reviewed: sodium low, AST/ALT elevated Medications reviewed and include: folic acid, senokot-s, thiamine Pt discussed during ICU rounds and with RN.  Nutrition-Focused physical exam completed. Findings are severe fat depletion, sever muscle depletion, and no edema.    Diet Order:  Diet NPO time specified  Skin:  Reviewed, no issues  Last BM:  7/23  Height:   Ht Readings from Last 1 Encounters:  06/05/15 6' (1.829 m)    Weight:   Wt Readings from Last 1 Encounters:  06/05/15 153 lb 7 oz (69.6 kg)    Ideal Body Weight:  80.9 kg  Wt Readings from Last 10 Encounters:  06/05/15 153 lb 7 oz (69.6 kg)    BMI:   Body mass index is 20.81 kg/(m^2).  Estimated Nutritional Needs:   Kcal:  6378  Protein:  100-115 grams  Fluid:  > 1.8 L/day  EDUCATION NEEDS:   No education needs identified at this time  [place], [place], [place] Pager [phone] After Hours Pager

## 2015-06-09 NOTE — Clinical Social Work Note (Addendum)
Clinical Social Worker received notification that patient has had worsening neuro status and planning for intubation and to start 3%.  Patient is unable to communicate and since admission has not been oriented to provide emergency contact information.  CSW called patient landlord [facility] [name]) who was not aware of patient hospitalization.  Patient landlord states that patient lives there off and on and he will do anything he can to assist in finding patient family.  Patient landlord is elderly and not processing information well.  CSW also searched through patient belongings and found an old [place] - attempted to call local hospital in [place] to determine if patient had every been a patient there and provided contact information (patient had never been a patient so no information available).  CSW was able to find that patient had been housed for a couple days in [place], [place] jail in 2009 and contacted the [facility] department.  They did have a contact name for [employer].  CSW located Mike Rowe [phone] contact information on the internet and left a message with the listed number - await return call.  Still no next of kin determined and not enough information to complete full assessment at this time.  CSW remains available for support and to continue to exhaust all efforts to find patient next of kin.  Patient SSN updated in [name], [place]

## 2015-06-09 NOTE — Procedures (Signed)
Central Venous Catheter Insertion Procedure Note Mike Rowe [id number] Jan 09, 1956  Procedure: Insertion of Central Venous Catheter Indications: Assessment of intravascular volume and Drug and/or fluid administration  Procedure Details Consent: Unable to obtain consent because of emergent medical necessity. Time Out: Verified patient identification, verified procedure, site/side was marked, verified correct patient position, special equipment/implants available, medications/allergies/relevent history reviewed, required imaging and test results available.  Performed  Maximum sterile technique was used including antiseptics, cap, gloves, gown, hand hygiene, mask and sheet. Skin prep: Chlorhexidine; local anesthetic administered A antimicrobial bonded/coated triple lumen catheter was placed in the left internal jugular vein using the Seldinger technique.  Evaluation Blood flow good Complications: No apparent complications Patient did tolerate procedure well. Chest X-Mike ordered to verify placement.  CXR: pending.  U/S used in placement.  Mike Rowe 06/09/2015, 11:48 AM

## 2015-06-09 NOTE — Progress Notes (Signed)
Patient more lethargic this morning.  Dr. Leonie Man at bedside and aware.  Order for CT placed.  Will continue to monitor patient.

## 2015-06-09 NOTE — Consult Note (Signed)
PULMONARY / CRITICAL CARE MEDICINE   Name: Mike Rowe MRN: [id number] DOB: 01-07-1956    ADMISSION DATE:  06/05/2015 CONSULTATION DATE:  06/09/2015  REFERRING MD :  Neurology  CHIEF COMPLAINT:  Acute respiratory failure due to inability to protect his airway.  INITIAL PRESENTATION: 59 year old male with no known PMH who presents on 7/22 with acute onset slurred speech and inability to stand.  Code stroke called, hemorrhagic thalamic stroke noted.  On 7/26 patient's CT and mental status worsened with cytotoxic edema and shift then was transferred to the ICU for inability to protect his airway.  Patient will also need hypertonic saline.  PCCM was called for respiratory failure and need for hypertonic saline.  STUDIES:  7/26 Head CT with edema and shift.  SIGNIFICANT EVENTS: 7/26 Transfer to the ICU for intubation and 3% saline.  HISTORY OF PRESENT ILLNESS:  59 year old male with no known PMH who presents on 7/22 with acute onset slurred speech and inability to stand.  Code stroke called, hemorrhagic thalamic stroke noted.  On 7/26 patient's CT and mental status worsened with cytotoxic edema and shift then was transferred to the ICU for inability to protect his airway.  Patient will also need hypertonic saline.  PCCM was called for respiratory failure and need for hypertonic saline.  PAST MEDICAL HISTORY :   has no past medical history on file.  has no past surgical history on file. Prior to Admission medications   Not on File   No Known Allergies  FAMILY HISTORY:  has no family status information on file.  SOCIAL HISTORY:    REVIEW OF SYSTEMS:  Unattainable, patient is obtunded.  SUBJECTIVE:   VITAL SIGNS: Temp:  [98.2 F (36.8 C)-99.5 F (37.5 C)] 98.2 F (36.8 C) (07/26 0837) Pulse Rate:  [77-123] 87 (07/26 1100) Resp:  [12-45] 12 (07/26 1100) BP: (125-224)/(66-146) 198/127 mmHg (07/26 1100) SpO2:  [85 %-99 %] 97 % (07/26 1100)   HEMODYNAMICS:   VENTILATOR SETTINGS:    INTAKE / OUTPUT:  Intake/Output Summary (Last 24 hours) at 06/09/15 1119 Last data filed at 06/09/15 0805  Gross per 24 hour  Intake 560.04 ml  Output    700 ml  Net -139.96 ml    PHYSICAL EXAMINATION: General:  Chronically ill appearing male, old than stated age. Neuro:  Obtunded, not following commands, not answering questions. HEENT:  [place]/AT, PERRL, EOM-I and MMM. Cardiovascular:  RRR, Nl S1/S2, -M/R/G. Lungs:  Transmitted upper airway sounds, very weak gag. Abdomen:  Soft, NT, ND and +BS. Musculoskeletal:  -edema and -tenderness. Skin:  Intact.  LABS:  CBC  Recent Labs Lab 06/05/15 2227 06/05/15 2234 06/07/15 0235 06/08/15 0215  WBC 5.8  --  7.0 12.2*  HGB 15.8 16.3 14.9 16.9  HCT 45.0 48.0 43.1 48.0  PLT 69*  --  74* 103*   Coag's  Recent Labs Lab 06/05/15 2227 06/07/15 1050  APTT 32 34  INR 1.11 1.09   BMET  Recent Labs Lab 06/05/15 2227 06/05/15 2234 06/07/15 0907 06/08/15 0215  NA 138 143 135 133*  K 3.8 3.7 3.9 4.5  CL 109 108 105 104  CO2 21*  --  21* 18*  BUN 21* 26* 13 14  CREATININE 1.13 1.10 0.82 0.90  GLUCOSE 121* 122* 107* 118*   Electrolytes  Recent Labs Lab 06/05/15 2227 06/07/15 0907 06/08/15 0215  CALCIUM 9.5 8.7* 8.8*  MG  --  2.1  --   PHOS  --  3.1  --  Sepsis Markers No results for input(s): LATICACIDVEN, PROCALCITON, O2SATVEN in the last 168 hours. ABG No results for input(s): PHART, PCO2ART, PO2ART in the last 168 hours. Liver Enzymes  Recent Labs Lab 06/05/15 2227 06/07/15 0907 06/08/15 0215  AST 186* 180* 173*  ALT 208* 211* 216*  ALKPHOS 75 76 82  BILITOT 0.7 0.9 1.1  ALBUMIN 3.8 3.6 3.7   Cardiac Enzymes No results for input(s): TROPONINI, PROBNP in the last 168 hours. Glucose  Recent Labs Lab 06/08/15 1203 06/08/15 1535 06/08/15 1931 06/08/15 2347 06/09/15 0358 06/09/15 0836  GLUCAP 109* 111* 110* 128* 129* 120*   Imaging Ct Head Wo Contrast  06/09/2015   CLINICAL DATA:  Right  facial droop. Right intraparenchymal hemorrhage.  EXAM: CT HEAD WITHOUT CONTRAST  TECHNIQUE: Contiguous axial images were obtained from the base of the skull through the vertex without intravenous contrast.  COMPARISON:  CT scan of June 08, 2015.  FINDINGS: Bony calvarium appears intact. The intraparenchymal hemorrhage seen in the right thalamus and basal ganglia on prior exam is significantly enlarged currently, measuring 4.6 x 3.4 cm. There is an increased amount of hemorrhage seen within the right lateral ventricle. Small amount of hemorrhage is also seen in the left posterior horn which is new since prior exam. There appears to be hemorrhage in the third ventricle as well. Slightly increased left lateral ventricular dilatation is noted. There is approximately 6 mm of left-to-right midline shift which is increased compared to prior exam. Fourth ventricle appears normal.  IMPRESSION: Intraparenchymal hemorrhage seen in right thalamus and basal ganglia on prior exam is significantly enlarged, with increased amount of hemorrhage seen within the right lateral ventricle. Small amount of hemorrhage is now noted in the left lateral and third ventricles, with increased dilatation of the left lateral ventricle. These results will be called to the ordering clinician or representative by the Radiologist Assistant, and communication documented in the PACS or zVision Dashboard.   Electronically Signed   By: Marijo Conception, M.D.   On: 06/09/2015 10:08   ASSESSMENT / PLAN:  PULMONARY OETT 7/26>>> A: Intubated due to inability to to protect his airway from [place]. P:   - Intubate. - Mechanically ventilate. - F/U CXR and ABG. - CXR and ABG in AM. - Adjust vent for ABG.  CARDIOVASCULAR CVL L IJ TLC 7/26>>> A: HTN likely due to [place]. P:  - Intubate. - Drop CO2 down to 30's. - BP control as ordered. - Target BP per neuro.  RENAL A:  NAG metabolic acidosis.  Likely related to dropping Na. P:   - Hypertonic  saline as ordered. - BMET in AM. - Replace electrolytes as indicated. - 3% per neuro.  GASTROINTESTINAL A:  Unable to swallow. P:   - Gastric tube. - TF per nutrition. - PPI.  HEMATOLOGIC A:  Leukocytosis without fever.  Likely stress response. P:  - CBC in AM. - Transfuse per ICU protocol.  INFECTIOUS A:  No evidence of infection but watch for aspiration PNA. P:   No cultures.  Watch fever curve and WBC off antibiotics.  ENDOCRINE A:  No known history.   P:   - Monitor.  NEUROLOGIC A:  ICH, edema, obtunded. P:   RASS goal: 0. - Propofol drip. - Fentanyl pushes. - 3% saline. - BP control as ordered.  FAMILY  - Updates: no family available.   The patient is critically ill with multiple organ systems failure and requires high complexity decision making for assessment and support,  frequent evaluation and titration of therapies, application of advanced monitoring technologies and extensive interpretation of multiple databases.   Critical Care Time devoted to patient care services described in this note is  35  Minutes. This time reflects time of care of this signee Dr Jennet Maduro. This critical care time does not reflect procedure time, or teaching time or supervisory time of PA/NP/Med student/Med Resident etc but could involve care discussion time.  Rush Farmer, M.D. [facility] Pulmonary/Critical Care Medicine. Pager: [phone]. After hours pager: [phone].  06/09/2015, 11:19 AM

## 2015-06-09 NOTE — Progress Notes (Signed)
Inpatient Rehabilitation  Patient was screened by Gerlean Ren for appropriateness for an Inpatient Acute Rehab consult. At this time, we are not recommending Inpatient Rehab consult as pt. currently unable to participate in intensity of IP Rehab program due to his combativeness.  If pt.becomes more able to participate, please notify the Admissions Coordinator  Pt. Will likely need SNF level care and therapies as recommended by the acute therapy staff.  Please call if questions.   [place] Admissions Coordinator Cell [phone] Office [phone]

## 2015-06-09 NOTE — Progress Notes (Addendum)
STROKE TEAM PROGRESS NOTE   HISTORY Mike Rowe is an 59 y.o. male with no reported past medical history (hasn't been to doctors in years) presents with acute onset of slurred speech and left sided weakness. LSW 2130 when he noted he was unable to stand or use his left side. EMS called, upon arrival noted BP of 240/140. Code stroke activated. CT head imaging reviewed, shows right sided thalamic/BG ICH. Denies taking any blood thinners.  He denies any past medical history including hypertension. Denies any surgical history. Denies alcohol or drug abuse.  Date last known well: 06/05/2015 Time last known well: 2130 tPA Given: no, ICH Modified Rankin: 0 ICH Score: 0  Initial NIHSS of 12   SUBJECTIVE (INTERVAL HISTORY) He is drowsy this morning. He is still on alcohol withdrawal precaution protocol  . No respiratory distress but increasingly harder to arouse. Last dose of Ativan was at 4 AM today. Repeat CT scan of the head done during rounds continues to show increasing hematoma size with no intraventricular extension and mass effect and right-to-left shift.  Temp:  [98.2 F (36.8 C)-99.5 F (37.5 C)] 98.2 F (36.8 C) (07/26 0837) Pulse Rate:  [79-123] 106 (07/26 0915) Cardiac Rhythm:  [-] Normal sinus rhythm (07/26 0800) Resp:  [14-45] 14 (07/26 0915) BP: (125-224)/(66-146) 198/134 mmHg (07/26 0915) SpO2:  [85 %-99 %] 99 % (07/26 0915)   Recent Labs Lab 06/08/15 1535 06/08/15 1931 06/08/15 2347 06/09/15 0358 06/09/15 0836  GLUCAP 111* 110* 128* 129* 120*    Recent Labs Lab 06/05/15 2227 06/05/15 2234 06/07/15 0907 06/08/15 0215  NA 138 143 135 133*  K 3.8 3.7 3.9 4.5  CL 109 108 105 104  CO2 21*  --  21* 18*  GLUCOSE 121* 122* 107* 118*  BUN 21* 26* 13 14  CREATININE 1.13 1.10 0.82 0.90  CALCIUM 9.5  --  8.7* 8.8*  MG  --   --  2.1  --   PHOS  --   --  3.1  --     Recent Labs Lab 06/05/15 2227 06/07/15 0907 06/08/15 0215  AST 186* 180* 173*  ALT 208* 211* 216*   ALKPHOS 75 76 82  BILITOT 0.7 0.9 1.1  PROT 7.1 7.4 7.7  ALBUMIN 3.8 3.6 3.7    Recent Labs Lab 06/05/15 2227 06/05/15 2234 06/07/15 0235 06/08/15 0215  WBC 5.8  --  7.0 12.2*  NEUTROABS 3.5  --   --   --   HGB 15.8 16.3 14.9 16.9  HCT 45.0 48.0 43.1 48.0  MCV 92.6  --  91.5 92.1  PLT 69*  --  74* 103*   No results for input(s): CKTOTAL, CKMB, CKMBINDEX, TROPONINI in the last 168 hours.  Recent Labs  06/07/15 1050  LABPROT 14.3  INR 1.09   No results for input(s): COLORURINE, LABSPEC, PHURINE, GLUCOSEU, HGBUR, BILIRUBINUR, KETONESUR, PROTEINUR, UROBILINOGEN, NITRITE, LEUKOCYTESUR in the last 72 hours.  Invalid input(s): APPERANCEUR  No results found for: CHOL, TRIG, HDL, CHOLHDL, VLDL, LDLCALC Lab Results  Component Value Date   HGBA1C 5.2 06/07/2015      Component Value Date/Time   LABOPIA NONE DETECTED 06/06/2015 0100   COCAINSCRNUR POSITIVE* 06/06/2015 0100   LABBENZ NONE DETECTED 06/06/2015 0100   AMPHETMU NONE DETECTED 06/06/2015 0100   THCU NONE DETECTED 06/06/2015 0100   LABBARB NONE DETECTED 06/06/2015 0100    No results for input(s): ETH in the last 168 hours.  Imaging   Ct Head Wo Contrast 06/05/2015  Acute right thalamic hemorrhage.     Ct Head Wo Contrast - ( Pre Fall ) 06/06/2015    Increase in size of right thalamic/posterior right internal capsule hematoma now with maximal transverse dimension 2.3 x 2.2 cm versus prior 2.1 x 1.6 cm.  Increase surrounding vasogenic edema and local mass effect with mild compression the right lateral ventricle without evidence of midline shift. Interval development of blood within the deep dependent aspect of the right lateral ventricle consistent with breakthrough of the right thalamic hemorrhage into the right lateral ventricle.   Ct Head Wo Contrast - ( Post fall ) 06/06/2015 Right for thalamic hemorrhage with a small amount of intraventricular blood appears unchanged. No new abnormality is  seen.    PHYSICAL EXAM Neurologic Examination: Mental Status: Drowsy can barely opens eyes. Speech is incoherent Moderate dysarthria. Not able to follow any commands  Cranial Nerves: II: funduscopic exam not done bilaterally, visual fields cannot be reliably tested, pupils equal, round, reactive to light and accommodation III,IV, VI: ptosis not present, extra-ocular motions intact bilaterally V,VII: left facial weakness, facial light touch sensation normal bilaterally VIII: hearing unable to assess bilaterally IX,X: gag reflex present XII: tongue midline Motor:  Dense left hemiplegia arm more than leg. able to move right side purposefully Sensory: decreased LT and PP on left side Deep Tendon Reflexes: 2+ and symmetric throughout Plantars: Right: downgoingLeft: downgoing Cerebellar: Normal FTN and HTS on right side Gait: deferred   ASSESSMENT/PLAN [name] is a 59 y.o. male with no significant past medical history no regular medical follow-up presenting with left hemiparesis really elevated blood pressures. He did not receive IV t-PA due to acute right thalamic hemorrhage.   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  Resultant  left hemiparesis  MRI  not performed  MRA  not performed   Carotid Doppler  not indicated  2D Echo Left ventricle: The cavity size was normal. Wall thickness was increased in a pattern of mild LVH. Systolic function was normal. The estimated ejection fraction was in the range of 60% to 65%. Wall motion was normal  LDL not indicated  HgbA1c 5.2  SCDs for VTE prophylaxis DIET DYS 2 Room service appropriate?: Yes; Fluid consistency:: Thin  no antithrombotic prior to admission, now on no antithrombotic secondary to hemorrhage  Ongoing aggressive stroke risk factor management  Therapy recommendations: Pending - patient passed barium swallow with speech  therapy.  Disposition:  Pending  Hypertension  Home meds:  No antihypertensives medications prior to admission  Stable   Other Stroke Risk Factors  Patient sleeping deeply after ativan.  Will obtain history for Cigarette smoking and alcohol use  Substance abuse  Other Active Problems  Thrombocytopenia  Elevated liver function tests  Anxiety / agitation  UDS positive for cocaine  Mildly elevated BUN  Patient fell out of bed Sunday, 06/07/2015 - patient now has a Actuary. Repeat head CT performed. See above.  Passed Modified barium swallow with speech on 06/06/15  Other Pertinent History  Substance abuse  ETOH use / abuse   PLAN   Patient `s hemorrhage continues to increase in size with more cytotoxic edema and mass effect but he will not be able to maintain his airway  for long and   avoid intubation hence will consult PCCM for central line and start hypertonic saline  May need intubation, trach and peg later. No family or HPOA makes decision on end of life and level of care frustating.Continue to treat  hypertension with IV medications CIWA protocol  And  thiamin and folate Continue tight control of BP, normothermia, euvolemia  Hospital day # 4 I have personally examined this patient, reviewed notes, independently viewed imaging studies, participated in medical decision making and plan of care. I have made any additions or clarifications directly to the above note. This patient is critically ill and at significant risk of neurological worsening, death and care requires constant monitoring of vital signs, hemodynamics,respiratory and cardiac monitoring, extensive review of multiple databases, frequent neurological assessment, discussion with family, other specialists and medical decision making of high complexity.I have made any additions or clarifications directly to the above note.This critical care time does not reflect procedure time, or teaching time or supervisory time  of PA/NP/Med Resident etc but could involve care discussion time.  I spent 40 minutes of neurocritical care time  in the care of  this patient.  Antony Contras, MD Medical Director [facility] Pager: [phone] 06/09/2015 10:09 AM           To contact Stroke Continuity provider, please refer to [email or web address] After hours, contact General Neurology

## 2015-06-10 ENCOUNTER — Inpatient Hospital Stay (HOSPITAL_COMMUNITY): Payer: Medicaid Other

## 2015-06-10 LAB — BLOOD GAS, ARTERIAL
ACID-BASE DEFICIT: 4.6 mmol/L — AB (ref 0.0–2.0)
Bicarbonate: 19.5 mEq/L — ABNORMAL LOW (ref 20.0–24.0)
DRAWN BY: 441661
FIO2: 0.8
O2 Saturation: 98.9 %
PCO2 ART: 32.8 mmHg — AB (ref 35.0–45.0)
PEEP: 5 cmH2O
PH ART: 7.39 (ref 7.350–7.450)
Patient temperature: 98.6
RATE: 16 resp/min
TCO2: 20.5 mmol/L (ref 0–100)
VT: 550 mL
pO2, Arterial: 182 mmHg — ABNORMAL HIGH (ref 80.0–100.0)

## 2015-06-10 LAB — GLUCOSE, CAPILLARY
GLUCOSE-CAPILLARY: 120 mg/dL — AB (ref 65–99)
Glucose-Capillary: 125 mg/dL — ABNORMAL HIGH (ref 65–99)
Glucose-Capillary: 126 mg/dL — ABNORMAL HIGH (ref 65–99)
Glucose-Capillary: 127 mg/dL — ABNORMAL HIGH (ref 65–99)
Glucose-Capillary: 131 mg/dL — ABNORMAL HIGH (ref 65–99)
Glucose-Capillary: 136 mg/dL — ABNORMAL HIGH (ref 65–99)
Glucose-Capillary: 159 mg/dL — ABNORMAL HIGH (ref 65–99)

## 2015-06-10 LAB — CBC
HCT: 41.6 % (ref 39.0–52.0)
Hemoglobin: 14.1 g/dL (ref 13.0–17.0)
MCH: 32.2 pg (ref 26.0–34.0)
MCHC: 33.9 g/dL (ref 30.0–36.0)
MCV: 95 fL (ref 78.0–100.0)
Platelets: 83 10*3/uL — ABNORMAL LOW (ref 150–400)
RBC: 4.38 MIL/uL (ref 4.22–5.81)
RDW: 13.8 % (ref 11.5–15.5)
WBC: 9.6 10*3/uL (ref 4.0–10.5)

## 2015-06-10 LAB — BASIC METABOLIC PANEL
Anion gap: 5 (ref 5–15)
BUN: 47 mg/dL — ABNORMAL HIGH (ref 6–20)
CO2: 22 mmol/L (ref 22–32)
Calcium: 8.3 mg/dL — ABNORMAL LOW (ref 8.9–10.3)
Chloride: 118 mmol/L — ABNORMAL HIGH (ref 101–111)
Creatinine, Ser: 1.78 mg/dL — ABNORMAL HIGH (ref 0.61–1.24)
GFR calc non Af Amer: 40 mL/min — ABNORMAL LOW (ref >60)
GFR, EST AFRICAN AMERICAN: 46 mL/min — AB (ref >60)
GLUCOSE: 147 mg/dL — AB (ref 65–99)
Potassium: 3.9 mmol/L (ref 3.5–5.1)
Sodium: 145 mmol/L (ref 135–145)

## 2015-06-10 LAB — TRIGLYCERIDES: TRIGLYCERIDES: 183 mg/dL — AB (ref <150)

## 2015-06-10 LAB — MAGNESIUM: Magnesium: 2.5 mg/dL — ABNORMAL HIGH (ref 1.7–2.4)

## 2015-06-10 LAB — SODIUM
SODIUM: 148 mmol/L — AB (ref 135–145)
SODIUM: 152 mmol/L — AB (ref 135–145)
Sodium: 152 mmol/L — ABNORMAL HIGH (ref 135–145)

## 2015-06-10 LAB — PHOSPHORUS: PHOSPHORUS: 4.4 mg/dL (ref 2.5–4.6)

## 2015-06-10 MED ORDER — NICARDIPINE HCL IN NACL 20-0.86 MG/200ML-% IV SOLN
3.0000 mg/h | INTRAVENOUS | Status: DC
  Administered 2015-06-10: 5 mg/h via INTRAVENOUS
  Administered 2015-06-11 (×2): 2.5 mg/h via INTRAVENOUS
  Administered 2015-06-11: 5 mg/h via INTRAVENOUS
  Administered 2015-06-11: 7.5 mg/h via INTRAVENOUS
  Administered 2015-06-11: 2.5 mg/h via INTRAVENOUS
  Administered 2015-06-11 – 2015-06-12 (×2): 5 mg/h via INTRAVENOUS
  Administered 2015-06-12 (×2): 11 mg/h via INTRAVENOUS
  Administered 2015-06-12: 9 mg/h via INTRAVENOUS
  Filled 2015-06-10 (×12): qty 200

## 2015-06-10 NOTE — Progress Notes (Signed)
OT Cancellation Note  Patient Details Name: Noemi Ishmael MRN: [id number] DOB: 24-Mar-1956   Cancelled Treatment:    Reason Eval/Treat Not Completed: Medical issues which prohibited therapy - will reattempt as medically appropriate.   Darlina Rumpf [place], OTR/L [phone]  06/10/2015, 11:52 AM

## 2015-06-10 NOTE — Progress Notes (Signed)
Wean pt FiO2 per MD and ABG. sats are stable. Pt is on 60% O2. RN aware of am Gas and FiO2 change.

## 2015-06-10 NOTE — Progress Notes (Signed)
SLP Cancellation Note  Patient Details Name: Mike Rowe MRN: [id number] DOB: Mar 15, 1956   Cancelled treatment:       Reason Eval/Treat Not Completed: Medical issues which prohibited therapy - pt now intubated. Please re-order speech when medically ready.   Germain Osgood, M.A. CCC-SLP [phone]  Germain Osgood 06/10/2015, 8:53 AM

## 2015-06-10 NOTE — Progress Notes (Signed)
STROKE TEAM PROGRESS NOTE   HISTORY Mike Rowe is an 58 y.o. male with no reported past medical history (hasn't been to doctors in years) presents with acute onset of slurred speech and left sided weakness. LSW 2130 when he noted he was unable to stand or use his left side. EMS called, upon arrival noted BP of 240/140. Code stroke activated. CT head imaging reviewed, shows right sided thalamic/BG ICH. Denies taking any blood thinners.  He denies any past medical history including hypertension. Denies any surgical history. Denies alcohol or drug abuse.  Date last known well: 06/05/2015 Time last known well: 2130 tPA Given: no, ICH Modified Rankin: 0 ICH Score: 0  Initial NIHSS of 12   SUBJECTIVE (INTERVAL HISTORY) He is drowsy  interrelated. Last serum sodium was 145 on 3% saline. I have been gentle on using 3% saline due to his baseline malnourished status and risk of central pontine myelinilysis if his serum sodium rises too quickly  Temp:  [97.6 F (36.4 C)-100.5 F (38.1 C)] 99.9 F (37.7 C) (07/27 1132) Pulse Rate:  [66-88] 70 (07/27 1207) Cardiac Rhythm:  [-] Normal sinus rhythm (07/27 0800) Resp:  [18-32] 20 (07/27 1207) BP: (85-189)/(51-145) 181/90 mmHg (07/27 1210) SpO2:  [93 %-100 %] 100 % (07/27 1207) FiO2 (%):  [50 %-100 %] 50 % (07/27 1207) Weight:  [141 lb 5 oz (64.1 kg)-142 lb 10.2 oz (64.7 kg)] 142 lb 10.2 oz (64.7 kg) (07/27 0600)   Recent Labs Lab 06/09/15 2342 06/10/15 0320 06/10/15 0354 06/10/15 0753 06/10/15 1131  GLUCAP 131* 136* 125* 159* 127*    Recent Labs Lab 06/05/15 2227 06/05/15 2234 06/07/15 0907 06/08/15 0215 06/09/15 1545 06/09/15 2056 06/10/15 0328 06/10/15 0842  NA 138 143 135 133* 138 142 145 148*  K 3.8 3.7 3.9 4.5  --   --  3.9  --   CL 109 108 105 104  --   --  118*  --   CO2 21*  --  21* 18*  --   --  22  --   GLUCOSE 121* 122* 107* 118*  --   --  147*  --   BUN 21* 26* 13 14  --   --  47*  --   CREATININE 1.13 1.10 0.82 0.90   --   --  1.78*  --   CALCIUM 9.5  --  8.7* 8.8*  --   --  8.3*  --   MG  --   --  2.1  --  2.5*  --  2.5*  --   PHOS  --   --  3.1  --  7.8*  --  4.4  --     Recent Labs Lab 06/05/15 2227 06/07/15 0907 06/08/15 0215  AST 186* 180* 173*  ALT 208* 211* 216*  ALKPHOS 75 76 82  BILITOT 0.7 0.9 1.1  PROT 7.1 7.4 7.7  ALBUMIN 3.8 3.6 3.7    Recent Labs Lab 06/05/15 2227 06/05/15 2234 06/07/15 0235 06/08/15 0215 06/10/15 0328  WBC 5.8  --  7.0 12.2* 9.6  NEUTROABS 3.5  --   --   --   --   HGB 15.8 16.3 14.9 16.9 14.1  HCT 45.0 48.0 43.1 48.0 41.6  MCV 92.6  --  91.5 92.1 95.0  PLT 69*  --  74* 103* 83*   No results for input(s): CKTOTAL, CKMB, CKMBINDEX, TROPONINI in the last 168 hours. No results for input(s): LABPROT, INR in the last 72  hours. No results for input(s): COLORURINE, LABSPEC, [place], GLUCOSEU, HGBUR, BILIRUBINUR, KETONESUR, PROTEINUR, UROBILINOGEN, NITRITE, LEUKOCYTESUR in the last 72 hours.  Invalid input(s): APPERANCEUR     Component Value Date/Time   TRIG 183* 06/10/2015 1000   Lab Results  Component Value Date   HGBA1C 5.2 06/07/2015      Component Value Date/Time   LABOPIA NONE DETECTED 06/06/2015 0100   COCAINSCRNUR POSITIVE* 06/06/2015 0100   LABBENZ NONE DETECTED 06/06/2015 0100   AMPHETMU NONE DETECTED 06/06/2015 0100   THCU NONE DETECTED 06/06/2015 0100   LABBARB NONE DETECTED 06/06/2015 0100    No results for input(s): ETH in the last 168 hours.  Imaging   Ct Head Wo Contrast 06/05/2015    Acute right thalamic hemorrhage.     Ct Head Wo Contrast - ( Pre Fall ) 06/06/2015    Increase in size of right thalamic/posterior right internal capsule hematoma now with maximal transverse dimension 2.3 x 2.2 cm versus prior 2.1 x 1.6 cm.  Increase surrounding vasogenic edema and local mass effect with mild compression the right lateral ventricle without evidence of midline shift. Interval development of blood within the deep dependent aspect  of the right lateral ventricle consistent with breakthrough of the right thalamic hemorrhage into the right lateral ventricle.   Ct Head Wo Contrast - ( Post fall ) 06/06/2015 Right for thalamic hemorrhage with a small amount of intraventricular blood appears unchanged. No new abnormality is seen.    PHYSICAL EXAM Neurologic Examination: Mental Status: Drowsy can barely opens eyes.  . Not able to follow any commands  Cranial Nerves: II: funduscopic exam not done bilaterally, visual fields cannot be reliably tested, pupils equal, round, reactive to light and accommodation III,IV, VI: ptosis not present, extra-ocular motions intact bilaterally V,VII: left facial weakness, facial light touch sensation normal bilaterally VIII: hearing unable to assess bilaterally IX,X: gag reflex present XII: tongue midline Motor:  Dense left hemiplegia arm more than leg. able to move right side purposefully Sensory: decreased LT and PP on left side Deep Tendon Reflexes: 2+ and symmetric throughout Plantars: Right: downgoingLeft: downgoing Cerebellar: Normal FTN and HTS on right side Gait: deferred   ASSESSMENT/PLAN [name] is a 59 y.o. male with no significant past medical history no regular medical follow-up presenting with left hemiparesis really elevated blood pressures. He did not receive IV t-PA due to acute right thalamic hemorrhage.   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  Resultant  left hemiparesis  MRI  not performed  MRA  not performed   Carotid Doppler  not indicated  2D Echo Left ventricle: The cavity size was normal. Wall thickness was increased in a pattern of mild LVH. Systolic function was normal. The estimated ejection fraction was in the range of 60% to 65%. Wall motion was normal  LDL not indicated  HgbA1c 5.2  SCDs for VTE prophylaxis Diet NPO time specified  no  antithrombotic prior to admission, now on no antithrombotic secondary to hemorrhage  Ongoing aggressive stroke risk factor management  Therapy recommendations: Pending - patient passed barium swallow with speech therapy.  Disposition:  Pending  Hypertension  Home meds:  No antihypertensives medications prior to admission  Stable    Cytotoxic cerebral edema- on hypertonic saline sodium goal 150-155. Today Na 145   Other Stroke Risk Factors  Patient sleeping deeply after ativan.  Will obtain history for Cigarette smoking and alcohol use  Substance abuse  Other Active Problems  Thrombocytopenia  Elevated  liver function tests  Anxiety / agitation  UDS positive for cocaine  Mildly elevated BUN     Other Pertinent History  Substance abuse  ETOH use / abuse   PLAN  Continue hypertonic saline with sodium goal [phone]. Given patient's baseline malnourished status and history of alcoholism will not aggressively raise sodium due to fear of developing central pontine myelinolysis Continue tight control of BP, normothermia, euvolemia Continue tube feeds and increase to goal as tolerated  Hospital day # 5 I have personally examined this patient, reviewed notes, independently viewed imaging studies, participated in medical decision making and plan of care. I have made any additions or clarifications directly to the above note. This patient is critically ill and at significant risk of neurological worsening, death and care requires constant monitoring of vital signs, hemodynamics,respiratory and cardiac monitoring, extensive review of multiple databases, frequent neurological assessment, discussion with family, other specialists and medical decision making of high complexity.I have made any additions or clarifications directly to the above note.This critical care time does not reflect procedure time, or teaching time or supervisory time of PA/NP/Med Resident etc but could  involve care discussion time.  I spent 35 minutes of neurocritical care time  in the care of  this patient.  Antony Contras, MD Medical Director [place] Pager: [phone] 06/10/2015 12:52 PM           To contact Stroke Continuity provider, please refer to [email or web address] After hours, contact General Neurology

## 2015-06-10 NOTE — Progress Notes (Signed)
PULMONARY / CRITICAL CARE MEDICINE   Name: Mike Rowe MRN: [id number] DOB: August 26, 1956    ADMISSION DATE:  06/05/2015 CONSULTATION DATE:  06/09/2015  REFERRING MD :  Neurology  CHIEF COMPLAINT:  Acute respiratory failure due to inability to protect his airway.  INITIAL PRESENTATION: 59 year old male with no known PMH who presents on 7/22 with acute onset slurred speech and inability to stand.  Code stroke called, hemorrhagic thalamic stroke noted.  On 7/26 patient's CT and mental status worsened with cytotoxic edema and shift then was transferred to the ICU for inability to protect his airway.  Patient will also need hypertonic saline.  PCCM was called for respiratory failure and need for hypertonic saline.  STUDIES:  7/26 Head CT with edema and shift.  SIGNIFICANT EVENTS: 7/26 Transfer to the ICU for intubation and 3% saline.   VITAL SIGNS: Temp:  [97.6 F (36.4 C)-100.5 F (38.1 C)] 97.6 F (36.4 C) (07/27 0754) Pulse Rate:  [67-95] 78 (07/27 0922) Resp:  [12-32] 22 (07/27 0922) BP: (85-198)/(51-145) 180/87 mmHg (07/27 0922) SpO2:  [93 %-100 %] 97 % (07/27 0922) FiO2 (%):  [50 %-100 %] 50 % (07/27 0922) Weight:  [64.1 kg (141 lb 5 oz)-64.7 kg (142 lb 10.2 oz)] 64.7 kg (142 lb 10.2 oz) (07/27 0600)   HEMODYNAMICS:   VENTILATOR SETTINGS: Vent Mode:  [-] PSV;CPAP FiO2 (%):  [50 %-100 %] 50 % Set Rate:  [16 bmp] 16 bmp Vt Set:  [550 mL] 550 mL PEEP:  [5 cmH20] 5 cmH20 Pressure Support:  [5 cmH20] 5 cmH20 Plateau Pressure:  [8 cmH20-14 cmH20] 11 cmH20 INTAKE / OUTPUT:  Intake/Output Summary (Last 24 hours) at 06/10/15 0951 Last data filed at 06/10/15 0800  Gross per 24 hour  Intake 1385.77 ml  Output   1100 ml  Net 285.77 ml    PHYSICAL EXAMINATION: General:  Chronically ill appearing male, old than stated age. Cycling on PSV Neuro:  sedated, not following commands, purposeful on right. W/d on left  HEENT:  [place]/AT, PERRL, EOM-I and MMM. Cardiovascular:  RRR, Nl S1/S2,  -M/R/G. Lungs:  Scattered rhonchi  Abdomen:  Soft, NT, ND and +BS. Musculoskeletal:  -edema and -tenderness. Skin:  Intact.  LABS:  CBC  Recent Labs Lab 06/07/15 0235 06/08/15 0215 06/10/15 0328  WBC 7.0 12.2* 9.6  HGB 14.9 16.9 14.1  HCT 43.1 48.0 41.6  PLT 74* 103* 83*   Coag's  Recent Labs Lab 06/05/15 2227 06/07/15 1050  APTT 32 34  INR 1.11 1.09   BMET  Recent Labs Lab 06/07/15 0907 06/08/15 0215  06/09/15 2056 06/10/15 0328 06/10/15 0842  NA 135 133*  < > 142 145 148*  K 3.9 4.5  --   --  3.9  --   CL 105 104  --   --  118*  --   CO2 21* 18*  --   --  22  --   BUN 13 14  --   --  47*  --   CREATININE 0.82 0.90  --   --  1.78*  --   GLUCOSE 107* 118*  --   --  147*  --   < > = values in this interval not displayed. Electrolytes  Recent Labs Lab 06/07/15 0907 06/08/15 0215 06/09/15 1545 06/10/15 0328  CALCIUM 8.7* 8.8*  --  8.3*  MG 2.1  --  2.5* 2.5*  PHOS 3.1  --  7.8* 4.4   Sepsis Markers No results for input(s):  LATICACIDVEN, PROCALCITON, O2SATVEN in the last 168 hours. ABG  Recent Labs Lab 06/09/15 1425 06/10/15 0513  PHART 7.422 7.390  PCO2ART 28.6* 32.8*  PO2ART 83.6 182*   Liver Enzymes  Recent Labs Lab 06/05/15 2227 06/07/15 0907 06/08/15 0215  AST 186* 180* 173*  ALT 208* 211* 216*  ALKPHOS 75 76 82  BILITOT 0.7 0.9 1.1  ALBUMIN 3.8 3.6 3.7   Cardiac Enzymes No results for input(s): TROPONINI, PROBNP in the last 168 hours. Glucose  Recent Labs Lab 06/09/15 1623 06/09/15 2003 06/09/15 2342 06/10/15 0320 06/10/15 0354 06/10/15 0753  GLUCAP 114* 128* 131* 136* 125* 159*   Imaging Ct Head Wo Contrast  06/09/2015   CLINICAL DATA:  Right facial droop. Right intraparenchymal hemorrhage.  EXAM: CT HEAD WITHOUT CONTRAST  TECHNIQUE: Contiguous axial images were obtained from the base of the skull through the vertex without intravenous contrast.  COMPARISON:  CT scan of June 08, 2015.  FINDINGS: Bony calvarium  appears intact. The intraparenchymal hemorrhage seen in the right thalamus and basal ganglia on prior exam is significantly enlarged currently, measuring 4.6 x 3.4 cm. There is an increased amount of hemorrhage seen within the right lateral ventricle. Small amount of hemorrhage is also seen in the left posterior horn which is new since prior exam. There appears to be hemorrhage in the third ventricle as well. Slightly increased left lateral ventricular dilatation is noted. There is approximately 6 mm of left-to-right midline shift which is increased compared to prior exam. Fourth ventricle appears normal.  IMPRESSION: Intraparenchymal hemorrhage seen in right thalamus and basal ganglia on prior exam is significantly enlarged, with increased amount of hemorrhage seen within the right lateral ventricle. Small amount of hemorrhage is now noted in the left lateral and third ventricles, with increased dilatation of the left lateral ventricle. These results will be called to the ordering clinician or representative by the Radiologist Assistant, and communication documented in the PACS or zVision Dashboard.   Electronically Signed   By: Marijo Conception, M.D.   On: 06/09/2015 10:08   Dg Chest Port 1 View  06/10/2015   CLINICAL DATA:  Intubated patient, acute respiratory failure with hypoxia, alcohol withdrawal  EXAM: PORTABLE CHEST - 1 VIEW  COMPARISON:  Portable chest x-ray of June 08, 2014  FINDINGS: The lungs remain hyperinflated. There remain coarse infrahilar lung markings bilaterally consistent with subsegmental atelectasis. There is no pneumothorax or significant pleural effusion. The cardiac silhouette is normal in size. The pulmonary vascularity is not engorged.  The endotracheal tube tip lies 4 cm above the carina. The esophagogastric tube tip projects below the inferior margin of the image. The left internal jugular venous catheter tip projects over the midportion of the SVC  IMPRESSION: 1. COPD. Improving  left lower lobe atelectasis. There is no CHF or pneumonia. 2. The support tubes are in reasonable position.   Electronically Signed   By: David  Martinique M.D.   On: 06/10/2015 08:02   Dg Chest Port 1 View  06/09/2015   CLINICAL DATA:  Hypoxia  EXAM: PORTABLE CHEST - 1 VIEW  COMPARISON:  June 08, 2015  FINDINGS: Endotracheal tube tip is 5.2 cm above the carina. Central catheter tip is in the superior vena cava. No pneumothorax. There is consolidation in the left lower lobe. Small left effusion. The lungs are otherwise clear. Heart size and pulmonary vascularity are normal. No adenopathy. No bone lesions.  IMPRESSION: Tube and catheter positions as described without pneumothorax. Consolidation left lower lobe  with small left effusion.   Electronically Signed   By: Lowella Grip III M.D.   On: 06/09/2015 13:29   Dg Abd Portable 1v  06/09/2015   CLINICAL DATA:  Orogastric tube placement  EXAM: PORTABLE ABDOMEN - 1 VIEW  COMPARISON:  None.  FINDINGS: Orogastric tube tip is in the proximal stomach. The side port is at the gastroesophageal junction. There is contrast in the distal half of the colon. There is no bowel dilatation or air-fluid level suggesting obstruction. No free air is seen on this supine examination.  IMPRESSION: Orogastric tube tip in proximal stomach. Side-port is at the gastroesophageal junction. Advise advancing tube approximately 6 cm to confirm that tube tip and side port are both well within the stomach. Overall bowel gas pattern unremarkable.   Electronically Signed   By: Lowella Grip III M.D.   On: 06/09/2015 16:34   ASSESSMENT / PLAN:  PULMONARY OETT 7/26>>> A: Intubated due to inability to to protect his airway from [place]. Excellent Vt on PSV. MS still will not support airway protection  P:   Cont OETT and MV.Madaline Brilliant to cycle PSV PAD protocol  Daily assessment of MS to support airway protection May need Trach   CARDIOVASCULAR CVL L IJ TLC 7/26>>> A: HTN likely due to  [place]. P:  Target BP per neuro. He is on low dose LMWH per neuro  For DVT prophylaxis   RENAL A:  Therapeutic hypernatremia  Mild AKI P:   Hypertonic saline  Serial Na  AM chemistry CK CVP D/c ACE I Replace electrolytes as indicated.  GASTROINTESTINAL A:   Dysphagia Abnormal LFTs  P:   Gastric tube. TF per nutrition. PPI. F/u LFTs in am  HEMATOLOGIC A:  Leukocytosis without fever.  Likely stress response. P:  CBC in AM. Transfuse per ICU protocol.  INFECTIOUS A:  No evidence of infection but watch for aspiration PNA. P:   No cultures.  Watch fever curve and WBC off antibiotics.  ENDOCRINE A:  No known history.   P:   - Monitor.  NEUROLOGIC A:  ICH, edema, obtunded. P:   RASS goal: 0.  Propofol drip. Fentanyl pushes. 3% saline. BP control as ordered.  FAMILY  - Updates: no family available.   No sig improvement. Does not appear that his MS will allow for adequate airway protection. Will cont supportive care. Has CT head scheduled for am 7/28. Will follow MS daily. Extubate when/if MS will support adequate airway protection. Might need trach if his MS does not improve.  Erick Colace ACNP-BC [place] Pager # [phone] OR # [phone] if no answer  Attending Note:  59 year old male with ICH and associated cytotoxic edema who presents to PCCM with edema and shift, required intubation 7/26.  On 3% saline now.  Will maintain on PS trials now but no extubation given mental status.  Clear chest exam.  Will need to consider early trach/peg as I do not see a reasonable chance at improvement in his current condition.  The patient is critically ill with multiple organ systems failure and requires high complexity decision making for assessment and support, frequent evaluation and titration of therapies, application of advanced monitoring technologies and extensive interpretation of multiple databases.   Critical Care Time devoted to patient  care services described in this note is  35  Minutes. This time reflects time of care of this signee Dr Jennet Maduro. This critical care time does not reflect procedure time, or  teaching time or supervisory time of PA/NP/Med student/Med Resident etc but could involve care discussion time.  Rush Farmer, M.D. [facility] Pulmonary/Critical Care Medicine. Pager: [phone]. After hours pager: [phone].  06/10/2015, 9:51 AM

## 2015-06-10 NOTE — Progress Notes (Signed)
PT Cancellation Note  Patient Details Name: Mike Rowe MRN: [id number] DOB: May 06, 1956   Cancelled Treatment:    Reason Eval/Treat Not Completed: Patient not medically ready.   Intubated and sedated.  Will try tomorrow as able. 06/10/2015  Donnella Sham, [place] [phone]  (pager)   Coleby Yett, Tessie Fass 06/10/2015, 11:50 AM

## 2015-06-10 NOTE — Progress Notes (Signed)
eLink Physician-Brief Progress Note Patient Name: Mike Rowe DOB: Dec 13, 1955 MRN: [id number]   Date of Service  06/10/2015  HPI/Events of Note  HTN - SBP Goal < 180. Goal not met inspite of Hydralazine and Labetalol IV PRN.   eICU Interventions  Will order a Nicardipine IV infusion.      Intervention Category Major Interventions: Hypertension - evaluation and management  Lysle Dingwall 06/10/2015, 6:36 PM

## 2015-06-11 ENCOUNTER — Inpatient Hospital Stay (HOSPITAL_COMMUNITY): Payer: Medicaid Other

## 2015-06-11 LAB — COMPREHENSIVE METABOLIC PANEL
ALT: 110 U/L — AB (ref 17–63)
ANION GAP: 7 (ref 5–15)
AST: 68 U/L — AB (ref 15–41)
Albumin: 2.9 g/dL — ABNORMAL LOW (ref 3.5–5.0)
Alkaline Phosphatase: 57 U/L (ref 38–126)
BILIRUBIN TOTAL: 1 mg/dL (ref 0.3–1.2)
BUN: 26 mg/dL — AB (ref 6–20)
CALCIUM: 8.2 mg/dL — AB (ref 8.9–10.3)
CHLORIDE: 126 mmol/L — AB (ref 101–111)
CO2: 22 mmol/L (ref 22–32)
Creatinine, Ser: 1.11 mg/dL (ref 0.61–1.24)
GFR calc Af Amer: >60 mL/min (ref >60)
Glucose, Bld: 155 mg/dL — ABNORMAL HIGH (ref 65–99)
Potassium: 3.2 mmol/L — ABNORMAL LOW (ref 3.5–5.1)
Sodium: 155 mmol/L — ABNORMAL HIGH (ref 135–145)
Total Protein: 6 g/dL — ABNORMAL LOW (ref 6.5–8.1)

## 2015-06-11 LAB — URINE MICROSCOPIC-ADD ON

## 2015-06-11 LAB — GLUCOSE, CAPILLARY
GLUCOSE-CAPILLARY: 125 mg/dL — AB (ref 65–99)
GLUCOSE-CAPILLARY: 132 mg/dL — AB (ref 65–99)
GLUCOSE-CAPILLARY: 153 mg/dL — AB (ref 65–99)
GLUCOSE-CAPILLARY: 174 mg/dL — AB (ref 65–99)
Glucose-Capillary: 155 mg/dL — ABNORMAL HIGH (ref 65–99)
Glucose-Capillary: 149 mg/dL — ABNORMAL HIGH (ref 65–99)

## 2015-06-11 LAB — CBC
HEMATOCRIT: 42.7 % (ref 39.0–52.0)
Hemoglobin: 13.9 g/dL (ref 13.0–17.0)
MCH: 31.9 pg (ref 26.0–34.0)
MCHC: 32.6 g/dL (ref 30.0–36.0)
MCV: 97.9 fL (ref 78.0–100.0)
PLATELETS: 83 10*3/uL — AB (ref 150–400)
RBC: 4.36 MIL/uL (ref 4.22–5.81)
RDW: 14.3 % (ref 11.5–15.5)
WBC: 12.5 10*3/uL — ABNORMAL HIGH (ref 4.0–10.5)

## 2015-06-11 LAB — SODIUM
SODIUM: 154 mmol/L — AB (ref 135–145)
Sodium: 157 mmol/L — ABNORMAL HIGH (ref 135–145)
Sodium: 157 mmol/L — ABNORMAL HIGH (ref 135–145)

## 2015-06-11 LAB — URINALYSIS, ROUTINE W REFLEX MICROSCOPIC
GLUCOSE, UA: 100 mg/dL — AB
Ketones, ur: 15 mg/dL — AB
NITRITE: POSITIVE — AB
Protein, ur: >300 mg/dL — AB
Specific Gravity, Urine: 1.028 (ref 1.005–1.030)
Urobilinogen, UA: 1 mg/dL (ref 0.0–1.0)
pH: 5 (ref 5.0–8.0)

## 2015-06-11 MED ORDER — HYDROCHLOROTHIAZIDE 10 MG/ML ORAL SUSPENSION
12.5000 mg | Freq: Every day | ORAL | Status: DC
  Administered 2015-06-12 – 2015-06-13 (×2): 13 mg
  Filled 2015-06-11 (×2): qty 1.88

## 2015-06-11 MED ORDER — AMLODIPINE BESYLATE 5 MG PO TABS
5.0000 mg | ORAL_TABLET | Freq: Every day | ORAL | Status: DC
  Administered 2015-06-11 – 2015-06-12 (×2): 5 mg via ORAL
  Filled 2015-06-11 (×2): qty 1

## 2015-06-11 MED ORDER — HYDROCHLOROTHIAZIDE 12.5 MG PO CAPS
12.5000 mg | ORAL_CAPSULE | Freq: Every day | ORAL | Status: DC
  Administered 2015-06-11: 12.5 mg via ORAL
  Filled 2015-06-11: qty 1

## 2015-06-11 MED ORDER — VANCOMYCIN HCL IN DEXTROSE 750-5 MG/150ML-% IV SOLN
750.0000 mg | Freq: Two times a day (BID) | INTRAVENOUS | Status: DC
  Administered 2015-06-11 – 2015-06-14 (×6): 750 mg via INTRAVENOUS
  Filled 2015-06-11 (×7): qty 150

## 2015-06-11 MED ORDER — PIPERACILLIN-TAZOBACTAM 3.375 G IVPB: 3.3750 g | Freq: Four times a day (QID) | INTRAVENOUS | Status: DC

## 2015-06-11 MED ORDER — PIPERACILLIN-TAZOBACTAM 3.375 G IVPB
3.3750 g | Freq: Three times a day (TID) | INTRAVENOUS | Status: DC
  Administered 2015-06-11 – 2015-06-15 (×12): 3.375 g via INTRAVENOUS
  Filled 2015-06-11 (×13): qty 50

## 2015-06-11 MED ORDER — POTASSIUM CHLORIDE 20 MEQ/15ML (10%) PO SOLN
30.0000 meq | ORAL | Status: AC
  Administered 2015-06-11 (×2): 30 meq
  Filled 2015-06-11 (×2): qty 30

## 2015-06-11 NOTE — Progress Notes (Signed)
[id number] called elink to notify that patient's urine was a red color. Urine was a dark amber and now a dark red. No obvious bleeding from foley insertion site. Will continue to monitor.

## 2015-06-11 NOTE — Progress Notes (Signed)
[facility] ADULT ICU REPLACEMENT PROTOCOL FOR AM LAB REPLACEMENT ONLY  The patient does apply for the [facility] Adult ICU Electrolyte Replacment Protocol based on the criteria listed below:   1. Is GFR >/= 40 ml/min? Yes.    Patient's GFR today is >60 2. Is urine output >/= 0.5 ml/kg/hr for the last 6 hours? Yes.   Patient's UOP is 1.7 ml/kg/hr 3. Is BUN < 60 mg/dL? Yes.    Patient's BUN today is 26 4. Abnormal electrolyte(s): K 3.2 5. Ordered repletion with: per protocol 6. If a panic level lab has been reported, has the CCM MD in charge been notified? No..   Physician:    Ronda Fairly A 06/11/2015 5:54 AM

## 2015-06-11 NOTE — Progress Notes (Signed)
PT Cancellation Note  Patient Details Name: Mike Rowe MRN: [id number] DOB: 09/20/1956   Cancelled Treatment:    Reason Eval/Treat Not Completed: Patient not medically ready.  Vented and Sedated.  Will see 7/29 as able.    [place] Hafen, Tessie Fass 06/11/2015, 10:13 AM

## 2015-06-11 NOTE — Progress Notes (Addendum)
eLink Physician-Brief Progress Note Patient Name: Mike Rowe DOB: [birth year] MRN: [id number]   Date of Service  06/11/2015  HPI/Events of Note  Multiple issues: 1. Nurse concerned about ST depression in Lead II on bedside monitor and 2. Temp = 101.2 F and WBC = 12.5K. CXR with L base opacity - pneumonia can't be excluded. No antibiotics.   eICU Interventions  Will order: 1. 12 Lead EKG now.  2. Blood Cultures X 2.  3. Sputum for Gram stain and sensitivity. 4. UA with reflex microscopic. 5. Vancomycin per pharmacy. 6. Zosyn IV. 7. Urine C&S now.      Intervention Category Intermediate Interventions: Infection - evaluation and management  Sommer,Steven Eugene 06/11/2015, 4:35 PM

## 2015-06-11 NOTE — Progress Notes (Signed)
PULMONARY / CRITICAL CARE MEDICINE   Name: Mike Rowe MRN: [id number] DOB: [birth year]    ADMISSION DATE:  06/05/2015 CONSULTATION DATE:  06/09/2015  REFERRING MD :  Neurology  CHIEF COMPLAINT:  Acute respiratory failure due to inability to protect his airway.  INITIAL PRESENTATION: 59 year old male with no known PMH who presents on 7/22 with acute onset slurred speech and inability to stand.  Code stroke called, hemorrhagic thalamic stroke noted.  On 7/26 patient's CT and mental status worsened with cytotoxic edema and shift then was transferred to the ICU for inability to protect his airway.  Patient will also need hypertonic saline.  PCCM was called for respiratory failure and need for hypertonic saline.  STUDIES:  7/26 Head CT with edema and shift.  SIGNIFICANT EVENTS: 7/26 Transfer to the ICU for intubation and 3% saline. 7/28 Hematuria  VITAL SIGNS: Temp:  [98.2 F (36.8 C)-100.9 F (38.3 C)] 100.5 F (38.1 C) (07/28 1116) Pulse Rate:  [84-121] 120 (07/28 1340) Resp:  [19-38] 38 (07/28 1340) BP: (132-203)/(63-99) 178/71 mmHg (07/28 1340) SpO2:  [93 %-99 %] 99 % (07/28 1340) FiO2 (%):  [40 %-50 %] 40 % (07/28 1340) Weight:  [65.1 kg (143 lb 8.3 oz)] 65.1 kg (143 lb 8.3 oz) (07/28 0458)   HEMODYNAMICS: CVP:  [4 mmHg-13 mmHg] 8 mmHg   VENTILATOR SETTINGS: Vent Mode:  [-] PRVC FiO2 (%):  [40 %-50 %] 40 % Set Rate:  [16 bmp] 16 bmp Vt Set:  [550 mL] 550 mL PEEP:  [5 cmH20] 5 cmH20 Pressure Support:  [8 cmH20] 8 cmH20 Plateau Pressure:  [10 cmH20-17 cmH20] 10 cmH20   INTAKE / OUTPUT:  Intake/Output Summary (Last 24 hours) at 06/11/15 1358 Last data filed at 06/11/15 1200  Gross per 24 hour  Intake 3401.21 ml  Output   2660 ml  Net 741.21 ml   PHYSICAL EXAMINATION: General:  Chronically ill appearing male, old than stated age.  On full vent support. Neuro:  Unresponsive, not following commands, purposeful on right. W/d on left.  Moves right side spontaneously not  left. HEENT:  [place]/AT, PERRL, EOM-I and MMM. Cardiovascular:  RRR, Nl S1/S2, -M/R/G. Lungs:  Scattered rhonchi  Abdomen:  Soft, NT, ND and +BS. Musculoskeletal:  -edema and -tenderness. Skin:  Intact.  LABS:  CBC  Recent Labs Lab 06/08/15 0215 06/10/15 0328 06/11/15 0245  WBC 12.2* 9.6 12.5*  HGB 16.9 14.1 13.9  HCT 48.0 41.6 42.7  PLT 103* 83* 83*   Coag's  Recent Labs Lab 06/05/15 2227 06/07/15 1050  APTT 32 34  INR 1.11 1.09   BMET  Recent Labs Lab 06/08/15 0215  06/10/15 0328  06/10/15 2027 06/11/15 0245 06/11/15 0910  NA 133*  < > 145  < > 152* 155* 157*  K 4.5  --  3.9  --   --  3.2*  --   CL 104  --  118*  --   --  126*  --   CO2 18*  --  22  --   --  22  --   BUN 14  --  47*  --   --  26*  --   CREATININE 0.90  --  1.78*  --   --  1.11  --   GLUCOSE 118*  --  147*  --   --  155*  --   < > = values in this interval not displayed. Electrolytes  Recent Labs Lab 06/07/15 [phone] 06/08/15 0215 06/09/15 1545  06/10/15 0328 06/11/15 0245  CALCIUM 8.7* 8.8*  --  8.3* 8.2*  MG 2.1  --  2.5* 2.5*  --   PHOS 3.1  --  7.8* 4.4  --    Sepsis Markers No results for input(s): LATICACIDVEN, PROCALCITON, O2SATVEN in the last 168 hours. ABG  Recent Labs Lab 06/09/15 1425 06/10/15 0513  PHART 7.422 7.390  PCO2ART 28.6* 32.8*  PO2ART 83.6 182*    Liver Enzymes  Recent Labs Lab 06/07/15 0907 06/08/15 0215 06/11/15 0245  AST 180* 173* 68*  ALT 211* 216* 110*  ALKPHOS 76 82 57  BILITOT 0.9 1.1 1.0  ALBUMIN 3.6 3.7 2.9*   Cardiac Enzymes No results for input(s): TROPONINI, PROBNP in the last 168 hours. Glucose  Recent Labs Lab 06/10/15 1528 06/10/15 1912 06/10/15 2312 06/11/15 0311 06/11/15 0741 06/11/15 1115  GLUCAP 126* 120* 125* 132* 153* 174*   Imaging Dg Chest Port 1 View  06/11/2015   CLINICAL DATA:  Atelectasis described on previous chest x-ray report. Clinical data from recent chest x-ray indicating acute respiratory failure with  hypoxia and alcohol withdrawal.  EXAM: PORTABLE CHEST - 1 VIEW  COMPARISON:  Chest x-ray dated 06/10/2015.  FINDINGS: Lungs are again hyperexpanded suggesting COPD. Dense opacity at the left lung base is unchanged. Lungs otherwise clear. No pleural effusion visualized. No pneumothorax.  Endotracheal tube remains well positioned with tip just above the level of the carina. Nasogastric tube passes below the diaphragm. A left-sided internal jugular central line appears adequately positioned with tip in the superior vena cava.  IMPRESSION: 1. Dense opacity at the left lung base without significant interval change. This is compatible with previous chest x-ray report description of atelectasis. If febrile, pneumonia could not be completely excluded. 2. Lungs again hyperexpanded suggesting some degree of COPD. 3. No new lung findings. 4. Endotracheal tube and nasogastric tube appear well positioned.   Electronically Signed   By: Franki Cabot M.D.   On: 06/11/2015 08:10   ASSESSMENT / PLAN:  PULMONARY OETT 7/26>>> A: Intubated due to inability to to protect his airway from [place]. Excellent Vt on PSV. MS still will not support airway protection  P:   Cont OETT and MV, no weaning given mental status. PAD protocol  Daily assessment of MS to support airway protection Need to determine early trach, no family available.  CARDIOVASCULAR CVL L IJ TLC 7/26>>> A: HTN likely due to [place]. P:  Target BP per neuro. D/C LMWH. SCDs.  RENAL A:  Therapeutic hypernatremia  Mild AKI P:   Hypertonic saline per neuro. Serial Na. AM chemistry. CK CVP. D/C ACE I. Replace electrolytes as indicated.  GASTROINTESTINAL A:   Dysphagia Abnormal LFTs P:   Gastric tube. TF per nutrition. PPI. F/u LFTs in am  HEMATOLOGIC A:  Leukocytosis without fever.  Likely stress response.   Hematuria. P:  CBC in AM. Transfuse per ICU protocol. SCD's. D/C LMWH.  INFECTIOUS A:  No evidence of infection but watch for  aspiration PNA. P:   No cultures.  Watch fever curve and WBC off antibiotics.  ENDOCRINE A:  No known history.   P:   - Monitor.  NEUROLOGIC A:  ICH, edema, obtunded. P:   RASS goal: 0.  Propofol drip. Fentanyl pushes. 3% saline. BP control as ordered.  FAMILY  - Updates: no family available.   No sig improvement. Does not appear that his MS will allow for adequate airway protection. Will cont supportive care. Has CT head scheduled for  am 7/29. Will follow MS daily.  Need to determine trach/peg time.  The patient is critically ill with multiple organ systems failure and requires high complexity decision making for assessment and support, frequent evaluation and titration of therapies, application of advanced monitoring technologies and extensive interpretation of multiple databases.   Critical Care Time devoted to patient care services described in this note is  35  Minutes. This time reflects time of care of this signee Dr Jennet Maduro. This critical care time does not reflect procedure time, or teaching time or supervisory time of PA/NP/Med student/Med Resident etc but could involve care discussion time.  Rush Farmer, M.D. [facility] Pulmonary/Critical Care Medicine. Pager: [phone]. After hours pager: [phone].  06/11/2015, 1:58 PM

## 2015-06-11 NOTE — Plan of Care (Signed)
Problem: Acute Treatment Outcomes Goal: BP within ordered parameters Outcome: Progressing Attempting to wean off cardene Goal: Prognosis discussed with family/patient as appropriate Outcome: Not Progressing No family available at this time

## 2015-06-11 NOTE — Progress Notes (Signed)
ANTIBIOTIC CONSULT NOTE - INITIAL  Pharmacy Consult for Vancomycin Indication: rule out pneumonia  No Known Allergies  Patient Measurements: Height: 6' (182.9 cm) Weight: 143 lb 8.3 oz (65.1 kg) IBW/kg (Calculated) : 77.6 Adjusted Body Weight:   Vital Signs: Temp: 101.2 F (38.4 C) (07/28 1545) Temp Source: Axillary (07/28 1545) BP: 143/64 mmHg (07/28 1600) Pulse Rate: 87 (07/28 1600) Intake/Output from previous day: 07/27 0701 - 07/28 0700 In: 2980.9 [I.V.:1930.9; NG/GT:1050] Out: 2285 [Urine:2285] Intake/Output from this shift: Total I/O In: 1444.7 [I.V.:794.7; NG/GT:650] Out: 1175 [Urine:1175]  Labs:  Recent Labs  06/10/15 0328 06/11/15 0245  WBC 9.6 12.5*  HGB 14.1 13.9  PLT 83* 83*  CREATININE 1.78* 1.11   Estimated Creatinine Clearance: 65.2 mL/min (by C-G formula based on Cr of 1.11). No results for input(s): VANCOTROUGH, VANCOPEAK, VANCORANDOM, GENTTROUGH, GENTPEAK, GENTRANDOM, TOBRATROUGH, TOBRAPEAK, TOBRARND, AMIKACINPEAK, AMIKACINTROU, AMIKACIN in the last 72 hours.   Microbiology: Recent Results (from the past 720 hour(s))  MRSA PCR Screening     Status: None   Collection Time: 06/05/15 12:56 AM  Result Value Ref Range Status   MRSA by PCR NEGATIVE NEGATIVE Final    Comment:        The GeneXpert MRSA Assay (FDA approved for NASAL specimens only), is one component of a comprehensive MRSA colonization surveillance program. It is not intended to diagnose MRSA infection nor to guide or monitor treatment for MRSA infections.     Medical History: No past medical history on file.  Medications:  No prescriptions prior to admission   Scheduled:  . amLODipine  5 mg Oral Daily  . antiseptic oral rinse  7 mL Mouth Rinse QID  . chlorhexidine  15 mL Mouth Rinse BID  . fentaNYL (SUBLIMAZE) injection  100 mcg Intravenous Once  . folic acid  1 mg Oral Daily  . [START ON 06/12/2015] hydrochlorothiazide  13 mg Per Tube Daily  . insulin aspart  1-3  Units Subcutaneous 6 times per day  . metoprolol tartrate  50 mg Oral BID  . multivitamin  5 mL Per Tube Daily  . pantoprazole sodium  40 mg Per Tube Q1200  . piperacillin-tazobactam (ZOSYN)  IV  3.375 g Intravenous 4 times per day  . senna-docusate  1 tablet Oral BID  . thiamine  100 mg Oral Daily   Infusions:  . feeding supplement (VITAL AF 1.2 CAL) 1,000 mL (06/11/15 0700)  . niCARDipine 7.5 mg/hr (06/11/15 1600)  . propofol (DIPRIVAN) infusion 20 mcg/kg/min (06/11/15 1600)  . sodium chloride (hypertonic) Stopped (06/11/15 0900)   Assessment: 59yo male presented with acute onset slurred speech and inability to stand on 7/22. CXR revealed L base opacity. Pharmacy is consulted to dose vancomycin for suspected pneumonia. Pt is febrile to 101.2, WBC 12.5, sCr 1.11.  Changed zosyn to typical extended infusion based on renal function.  Goal of Therapy:  Vancomycin trough level 15-20 mcg/ml  Plan:  Vancomycin 750mg  IV q12h Zosyn 3.375g IV q8h Expected duration 7 days with resolution of temperature and/or normalization of WBC Measure antibiotic drug levels at steady state Follow up culture results, renal function, and clinical course  Andrey Cota. Diona Foley, PharmD Clinical Pharmacist Pager [phone] 06/11/2015,4:38 PM

## 2015-06-11 NOTE — Progress Notes (Addendum)
STROKE TEAM PROGRESS NOTE   HISTORY Mike Rowe is an 59 y.o. male with no reported past medical history (hasn't been to doctors in years) presents with acute onset of slurred speech and left sided weakness. LSW 2130 when he noted he was unable to stand or use his left side. EMS called, upon arrival noted BP of 240/140. Code stroke activated. CT head imaging reviewed, shows right sided thalamic/BG ICH. Denies taking any blood thinners.  He denies any past medical history including hypertension. Denies any surgical history. Denies alcohol or drug abuse.  Date last known well: 06/05/2015 Time last known well: 2130 tPA Given: no, ICH Modified Rankin: 0 ICH Score: 0  Initial NIHSS of 12   SUBJECTIVE (INTERVAL HISTORY) He is drowsy    Last serum sodium was 155 on 3% saline. Remains sleepy and hard to arouse. Blood pressure required Cardene drip for control  Temp:  [98.2 F (36.8 C)-100.9 F (38.3 C)] 100.5 F (38.1 C) (07/28 1116) Pulse Rate:  [84-121] 120 (07/28 1340) Cardiac Rhythm:  [-] Normal sinus rhythm (07/28 0700) Resp:  [19-38] 38 (07/28 1340) BP: (132-203)/(63-99) 178/71 mmHg (07/28 1340) SpO2:  [93 %-99 %] 99 % (07/28 1340) FiO2 (%):  [40 %-50 %] 40 % (07/28 1340) Weight:  [143 lb 8.3 oz (65.1 kg)] 143 lb 8.3 oz (65.1 kg) (07/28 0458)   Recent Labs Lab 06/10/15 1912 06/10/15 2312 06/11/15 0311 06/11/15 0741 06/11/15 1115  GLUCAP 120* 125* 132* 153* 174*    Recent Labs Lab 06/05/15 2227 06/05/15 2234 06/07/15 0907 06/08/15 0215 06/09/15 1545  06/10/15 0328 06/10/15 0842 06/10/15 1442 06/10/15 2027 06/11/15 0245 06/11/15 0910  NA 138 143 135 133* 138  < > 145 148* 152* 152* 155* 157*  K 3.8 3.7 3.9 4.5  --   --  3.9  --   --   --  3.2*  --   CL 109 108 105 104  --   --  118*  --   --   --  126*  --   CO2 21*  --  21* 18*  --   --  22  --   --   --  22  --   GLUCOSE 121* 122* 107* 118*  --   --  147*  --   --   --  155*  --   BUN 21* 26* 13 14  --   --  47*   --   --   --  26*  --   CREATININE 1.13 1.10 0.82 0.90  --   --  1.78*  --   --   --  1.11  --   CALCIUM 9.5  --  8.7* 8.8*  --   --  8.3*  --   --   --  8.2*  --   MG  --   --  2.1  --  2.5*  --  2.5*  --   --   --   --   --   PHOS  --   --  3.1  --  7.8*  --  4.4  --   --   --   --   --   < > = values in this interval not displayed.  Recent Labs Lab 06/05/15 2227 06/07/15 0907 06/08/15 0215 06/11/15 0245  AST 186* 180* 173* 68*  ALT 208* 211* 216* 110*  ALKPHOS 75 76 82 57  BILITOT 0.7 0.9 1.1 1.0  PROT 7.1 7.4  7.7 6.0*  ALBUMIN 3.8 3.6 3.7 2.9*    Recent Labs Lab 06/05/15 2227 06/05/15 2234 06/07/15 0235 06/08/15 0215 06/10/15 0328 06/11/15 0245  WBC 5.8  --  7.0 12.2* 9.6 12.5*  NEUTROABS 3.5  --   --   --   --   --   HGB 15.8 16.3 14.9 16.9 14.1 13.9  HCT 45.0 48.0 43.1 48.0 41.6 42.7  MCV 92.6  --  91.5 92.1 95.0 97.9  PLT 69*  --  74* 103* 83* 83*   No results for input(s): CKTOTAL, CKMB, CKMBINDEX, TROPONINI in the last 168 hours. No results for input(s): LABPROT, INR in the last 72 hours. No results for input(s): COLORURINE, LABSPEC, [place], GLUCOSEU, HGBUR, BILIRUBINUR, KETONESUR, PROTEINUR, UROBILINOGEN, NITRITE, LEUKOCYTESUR in the last 72 hours.  Invalid input(s): APPERANCEUR     Component Value Date/Time   TRIG 183* 06/10/2015 1000   Lab Results  Component Value Date   HGBA1C 5.2 06/07/2015      Component Value Date/Time   LABOPIA NONE DETECTED 06/06/2015 0100   COCAINSCRNUR POSITIVE* 06/06/2015 0100   LABBENZ NONE DETECTED 06/06/2015 0100   AMPHETMU NONE DETECTED 06/06/2015 0100   THCU NONE DETECTED 06/06/2015 0100   LABBARB NONE DETECTED 06/06/2015 0100    No results for input(s): ETH in the last 168 hours.  Imaging   Ct Head Wo Contrast 06/05/2015    Acute right thalamic hemorrhage.     Ct Head Wo Contrast - ( Pre Fall ) 06/06/2015    Increase in size of right thalamic/posterior right internal capsule hematoma now with maximal  transverse dimension 2.3 x 2.2 cm versus prior 2.1 x 1.6 cm.  Increase surrounding vasogenic edema and local mass effect with mild compression the right lateral ventricle without evidence of midline shift. Interval development of blood within the deep dependent aspect of the right lateral ventricle consistent with breakthrough of the right thalamic hemorrhage into the right lateral ventricle.   Ct Head Wo Contrast - ( Post fall ) 06/06/2015 Right for thalamic hemorrhage with a small amount of intraventricular blood appears unchanged. No new abnormality is seen.    PHYSICAL EXAM Neurologic Examination: Mental Status: Drowsy can barely opens eyes.  . Not able to follow any commands  Cranial Nerves: II: funduscopic exam not done bilaterally, visual fields cannot be reliably tested, pupils equal, round, reactive to light and accommodation III,IV, VI: ptosis not present, extra-ocular motions intact bilaterally V,VII: left facial weakness, facial light touch sensation normal bilaterally VIII: hearing unable to assess bilaterally IX,X: gag reflex present XII: tongue midline Motor:  Dense left hemiplegia arm more than leg. able to move right side purposefully against gravity Sensory: decreased LT and PP on left side Deep Tendon Reflexes: 2+ and symmetric throughout Plantars: Right: downgoingLeft: downgoing Cerebellar: Normal FTN and HTS on right side Gait: deferred   ASSESSMENT/PLAN [name] is a 58 y.o. male with no significant past medical history no regular medical follow-up presenting with left hemiparesis really elevated blood pressures. He did not receive IV t-PA due to acute right thalamic hemorrhage.   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  Resultant  left hemiparesis  MRI  not performed  MRA  not performed   Carotid Doppler  not indicated  2D Echo Left ventricle: The cavity size was  normal. Wall thickness was increased in a pattern of mild LVH. Systolic function was normal. The estimated ejection fraction was in the range of 60% to 65%. Wall  motion was normal  LDL not indicated  HgbA1c 5.2  SCDs for VTE prophylaxis Diet NPO time specified  no antithrombotic prior to admission, now on no antithrombotic secondary to hemorrhage  Ongoing aggressive stroke risk factor management  Therapy recommendations: Pending -  .  Disposition:  Pending  Hypertension  Home meds:  No antihypertensives medications prior to admission  Stable    Cytotoxic cerebral edema- on hypertonic saline sodium goal 150-155. Today Na 155   Other Stroke Risk Factors   ? Cigarette smoking and alcohol use  Substance abuse  Other Active Problems  Thrombocytopenia  Elevated liver function tests  Anxiety / agitation  UDS positive for cocaine  Mildly elevated BUN     Other Pertinent History  Substance abuse  ETOH use / abuse   PLAN  Hold hypertonic saline with sodium goal 150-155. Given patient's baseline malnourished status and history of alcoholism will not aggressively raise sodium due to fear of developing central pontine myelinolysis Continue tight control of BP, normothermia, euvolemia Continue tube feeds and increase to goal as tolerated Add hydrochlorothiazide and Norvasc for hypertension and taper Cardene Repeat CT head in the morning  Hospital day # 6 I have personally examined this patient, reviewed notes, independently viewed imaging studies, participated in medical decision making and plan of care. I have made any additions or clarifications directly to the above note. This patient is critically ill and at significant risk of neurological worsening, death and care requires constant monitoring of vital signs, hemodynamics,respiratory and cardiac monitoring, extensive review of multiple databases, frequent neurological assessment, discussion with family,  other specialists and medical decision making of high complexity.I have made any additions or clarifications directly to the above note.This critical care time does not reflect procedure time, or teaching time or supervisory time of PA/NP/Med Resident etc but could involve care discussion time.  I spent 32 minutes of neurocritical care time  in the care of  this patient.  Antony Contras, MD Medical Director [facility] Pager: [phone] 06/11/2015 1:52 PM           To contact Stroke Continuity provider, please refer to [email or web address] After hours, contact General Neurology

## 2015-06-11 NOTE — Care Management (Signed)
UR updated . Magdalen Spatz RN BSN

## 2015-06-11 NOTE — Progress Notes (Signed)
Pt urine bloody but good volume. MD at bedside. Order to d/c lovenox and place SCDs on pt. Order also received to d/c CVP.   Mike Rowe

## 2015-06-12 ENCOUNTER — Inpatient Hospital Stay (HOSPITAL_COMMUNITY): Payer: Medicaid Other

## 2015-06-12 DIAGNOSIS — R404 Transient alteration of awareness: Secondary | ICD-10-CM | POA: Insufficient documentation

## 2015-06-12 DIAGNOSIS — J9811 Atelectasis: Secondary | ICD-10-CM

## 2015-06-12 LAB — BLOOD GAS, ARTERIAL
Acid-base deficit: 0.6 mmol/L (ref 0.0–2.0)
BICARBONATE: 22.9 meq/L (ref 20.0–24.0)
DRAWN BY: 42624
FIO2: 0.4
LHR: 16 {breaths}/min
MECHVT: 550 mL
O2 SAT: 99.1 %
PEEP: 5 cmH2O
Patient temperature: 98.6
TCO2: 24 mmol/L (ref 0–100)
pCO2 arterial: 33.9 mmHg — ABNORMAL LOW (ref 35.0–45.0)
pH, Arterial: 7.445 (ref 7.350–7.450)
pO2, Arterial: 137 mmHg — ABNORMAL HIGH (ref 80.0–100.0)

## 2015-06-12 LAB — BASIC METABOLIC PANEL
ANION GAP: 7 (ref 5–15)
BUN: 28 mg/dL — ABNORMAL HIGH (ref 6–20)
CALCIUM: 8.2 mg/dL — AB (ref 8.9–10.3)
CHLORIDE: 124 mmol/L — AB (ref 101–111)
CO2: 24 mmol/L (ref 22–32)
Creatinine, Ser: 1.13 mg/dL (ref 0.61–1.24)
GFR calc non Af Amer: >60 mL/min (ref >60)
Glucose, Bld: 155 mg/dL — ABNORMAL HIGH (ref 65–99)
POTASSIUM: 3 mmol/L — AB (ref 3.5–5.1)
SODIUM: 155 mmol/L — AB (ref 135–145)

## 2015-06-12 LAB — URINALYSIS, ROUTINE W REFLEX MICROSCOPIC
Bilirubin Urine: NEGATIVE
GLUCOSE, UA: 100 mg/dL — AB
Ketones, ur: 15 mg/dL — AB
LEUKOCYTES UA: NEGATIVE
Nitrite: NEGATIVE
Protein, ur: 100 mg/dL — AB
SPECIFIC GRAVITY, URINE: 1.03 (ref 1.005–1.030)
UROBILINOGEN UA: 1 mg/dL (ref 0.0–1.0)
pH: 5.5 (ref 5.0–8.0)

## 2015-06-12 LAB — CBC
HCT: 39.9 % (ref 39.0–52.0)
Hemoglobin: 13 g/dL (ref 13.0–17.0)
MCH: 31.6 pg (ref 26.0–34.0)
MCHC: 32.6 g/dL (ref 30.0–36.0)
MCV: 97.1 fL (ref 78.0–100.0)
PLATELETS: 90 10*3/uL — AB (ref 150–400)
RBC: 4.11 MIL/uL — AB (ref 4.22–5.81)
RDW: 14.5 % (ref 11.5–15.5)
WBC: 12.8 10*3/uL — AB (ref 4.0–10.5)

## 2015-06-12 LAB — GLUCOSE, CAPILLARY
GLUCOSE-CAPILLARY: 126 mg/dL — AB (ref 65–99)
GLUCOSE-CAPILLARY: 114 mg/dL — AB (ref 65–99)
GLUCOSE-CAPILLARY: 180 mg/dL — AB (ref 65–99)
Glucose-Capillary: 108 mg/dL — ABNORMAL HIGH (ref 65–99)
Glucose-Capillary: 147 mg/dL — ABNORMAL HIGH (ref 65–99)
Glucose-Capillary: 144 mg/dL — ABNORMAL HIGH (ref 65–99)
Glucose-Capillary: 143 mg/dL — ABNORMAL HIGH (ref 65–99)

## 2015-06-12 LAB — URINE MICROSCOPIC-ADD ON

## 2015-06-12 LAB — SODIUM
SODIUM: 155 mmol/L — AB (ref 135–145)
SODIUM: 156 mmol/L — AB (ref 135–145)
Sodium: 156 mmol/L — ABNORMAL HIGH (ref 135–145)

## 2015-06-12 LAB — PHOSPHORUS: Phosphorus: 3.1 mg/dL (ref 2.5–4.6)

## 2015-06-12 LAB — MAGNESIUM: MAGNESIUM: 2.3 mg/dL (ref 1.7–2.4)

## 2015-06-12 MED ORDER — BISACODYL 10 MG RE SUPP
10.0000 mg | Freq: Every day | RECTAL | Status: DC | PRN
  Administered 2015-06-12: 10 mg via RECTAL
  Filled 2015-06-12: qty 1

## 2015-06-12 MED ORDER — POTASSIUM CHLORIDE 20 MEQ/15ML (10%) PO SOLN
30.0000 meq | ORAL | Status: AC
  Administered 2015-06-12 (×2): 30 meq
  Filled 2015-06-12 (×2): qty 30

## 2015-06-12 MED ORDER — HYDRALAZINE HCL 25 MG PO TABS
25.0000 mg | ORAL_TABLET | Freq: Three times a day (TID) | ORAL | Status: DC
  Administered 2015-06-12 – 2015-06-15 (×9): 25 mg
  Filled 2015-06-12 (×13): qty 1

## 2015-06-12 MED ORDER — ACETAMINOPHEN 160 MG/5ML PO SOLN
650.0000 mg | ORAL | Status: DC | PRN
  Administered 2015-06-12 – 2015-06-14 (×3): 650 mg
  Filled 2015-06-12 (×3): qty 20.3

## 2015-06-12 MED ORDER — AMLODIPINE BESYLATE 5 MG PO TABS
5.0000 mg | ORAL_TABLET | Freq: Two times a day (BID) | ORAL | Status: DC
  Administered 2015-06-12 – 2015-06-13 (×2): 5 mg via ORAL
  Filled 2015-06-12 (×3): qty 1

## 2015-06-12 NOTE — Progress Notes (Signed)
RT put patient back on full support so patient can rest over night. RT will continue to monitor.

## 2015-06-12 NOTE — Progress Notes (Signed)
Nutrition Follow-up  DOCUMENTATION CODES:   Severe malnutrition in context of chronic illness  INTERVENTION:   Continue Vital AF 1.2 @ 60 ml/hr  Tube feeding regimen provides 1728 kcal (96% of needs), 108 grams of protein, and 1167 ml of H2O.   TF regimen and propofol at current rate providing 2058 total kcal/day (99 % of kcal needs)    NUTRITION DIAGNOSIS:   Malnutrition related to chronic illness as evidenced by severe depletion of body fat, severe depletion of muscle mass.  Ongoing   GOAL:   Patient will meet greater than or equal to 90% of their needs  met  MONITOR:   TF tolerance, Vent status, Labs, I & O's  REASON FOR ASSESSMENT:   Consult Enteral/tube feeding initiation and management  ASSESSMENT:   59 year old male with no known PMH who presents on 7/22 with acute onset slurred speech and inability to stand. Code stroke called, hemorrhagic thalamic stroke noted. On 7/26 patient's CT and mental status worsened with cytotoxic edema and shift then was transferred to the ICU for inability to protect his airway. Patient will also need hypertonic saline.  Patient is currently intubated on ventilator support MV: 13.7 L/min Temp (24hrs), Avg:100.1 F (37.8 C), Min:99.1 F (37.3 C), Max:101.2 F (38.4 C)  Propofol: 12.5 ml/hr provides 330 kcal per day from lipid  OG tube, tip in stomach Labs reviewed: sodium elevated (3% held), potassium low, wbc elevated Medications reviewed and include: folic acid, thiamine, MVI, senokot-s No family available, question progression ?trach/PEG  Diet Order:  Diet NPO time specified  Skin:  Reviewed, no issues  Last BM:  7/24  Height:   Ht Readings from Last 1 Encounters:  06/05/15 6' (1.829 m)    Weight:   Wt Readings from Last 1 Encounters:  06/12/15 147 lb 14.9 oz (67.1 kg)    Ideal Body Weight:  80.9 kg  BMI:  Body mass index is 20.06 kg/(m^2).  Estimated Nutritional Needs:   Kcal:  2087  Protein:   100-115 grams  Fluid:  > 1.8 L/day  EDUCATION NEEDS:   No education needs identified at this time  [place], [place], [place] Pager [phone] After Hours Pager

## 2015-06-12 NOTE — Procedures (Signed)
Pt transported from 2M14 by pt's RN, RT and transporter to CT then back to 6X45 without complications.

## 2015-06-12 NOTE — Progress Notes (Signed)
[facility] Electrolyte Replacement Protocol  Patient Name: Mike Boley DOB: [birth year] MRN: [id number]  Date of Service  06/12/2015   HPI/Events of Note    Recent Labs Lab 06/07/15 0907 06/08/15 0215 06/09/15 1545  06/10/15 0328  06/11/15 0245 06/11/15 0910 06/11/15 1532 06/11/15 2015 06/12/15 0306  NA 135 133* 138  < > 145  < > 155* 157* 154* 157* 155*  K 3.9 4.5  --   --  3.9  --  3.2*  --   --   --  3.0*  CL 105 104  --   --  118*  --  126*  --   --   --  124*  CO2 21* 18*  --   --  22  --  22  --   --   --  24  GLUCOSE 107* 118*  --   --  147*  --  155*  --   --   --  155*  BUN 13 14  --   --  47*  --  26*  --   --   --  28*  CREATININE 0.82 0.90  --   --  1.78*  --  1.11  --   --   --  1.13  CALCIUM 8.7* 8.8*  --   --  8.3*  --  8.2*  --   --   --  8.2*  MG 2.1  --  2.5*  --  2.5*  --   --   --   --   --  2.3  PHOS 3.1  --  7.8*  --  4.4  --   --   --   --   --  3.1  < > = values in this interval not displayed.  Estimated Creatinine Clearance: 64 mL/min (by C-G formula based on Cr of 1.13).  Intake/Output      07/28 0701 - 07/29 0700   I.V. (mL/kg) 1243.5 (19.1)   NG/GT 1665   IV Piggyback 250   Total Intake(mL/kg) 3158.5 (48.5)   Urine (mL/kg/hr) 1650 (1.1)   Total Output 1650   Net +1508.5        - I/O DETAILED x24h    Total I/O In: 1131.9 [I.V.:331.9; NG/GT:750; IV Piggyback:50] Out: 350 [Urine:350] - I/O THIS SHIFT    ASSESSMENT   eICURN Interventions  Electrolyte protocol criteria met. Lab values replaced per protocol. MD notified.    ASSESSMENT: MAJOR ELECTROLYTE    Lorene Dy 06/12/2015, 5:10 AM

## 2015-06-12 NOTE — Clinical Social Work Note (Signed)
CSW still unable to reach next of kin for patient. CSW may need to start process of applying guardianship if next of kin or other decision maker remains unavailable. CSW has left report for weekend CSW.   Liz Beach MSW, [place], [place], [phone]

## 2015-06-12 NOTE — Progress Notes (Addendum)
PULMONARY / CRITICAL CARE MEDICINE   Name: Mike Rowe MRN: [id number] DOB: March 12, 1956    ADMISSION DATE:  06/05/2015 CONSULTATION DATE:  06/09/2015  REFERRING MD :  Neurology  CHIEF COMPLAINT:  Acute respiratory failure due to inability to protect his airway.  INITIAL PRESENTATION: 59 year old male with no known PMH who presents on 7/22 with acute onset slurred speech and inability to stand.  Code stroke called, hemorrhagic thalamic stroke noted.  On 7/26 patient's CT and mental status worsened with cytotoxic edema and shift then was transferred to the ICU for inability to protect his airway.  Patient will also need hypertonic saline.  PCCM was called for respiratory failure and need for hypertonic saline.  STUDIES:  7/26 Head CT with edema and shift. 7/29- evolving bleed, no change  SIGNIFICANT EVENTS: 7/26 Transfer to the ICU for intubation and 3% saline. 7/28 Hematuria  VITAL SIGNS: Temp:  [99.1 F (37.3 C)-100.3 F (37.9 C)] 99.5 F (37.5 C) (07/29 1257) Pulse Rate:  [56-117] 91 (07/29 1600) Resp:  [10-29] 19 (07/29 1600) BP: (129-202)/(55-88) 155/68 mmHg (07/29 1600) SpO2:  [93 %-100 %] 96 % (07/29 1600) FiO2 (%):  [40 %-50 %] 50 % (07/29 1525) Weight:  [67.1 kg (147 lb 14.9 oz)] 67.1 kg (147 lb 14.9 oz) (07/29 0500)   HEMODYNAMICS:     VENTILATOR SETTINGS: Vent Mode:  [-] CPAP;PSV FiO2 (%):  [40 %-50 %] 50 % Set Rate:  [16 bmp] 16 bmp Vt Set:  [550 mL] 550 mL PEEP:  [5 cmH20] 5 cmH20 Pressure Support:  [5 cmH20-8 cmH20] 8 cmH20 Plateau Pressure:  [10 cmH20-152 cmH20] 152 cmH20   INTAKE / OUTPUT:  Intake/Output Summary (Last 24 hours) at 06/12/15 1703 Last data filed at 06/12/15 1600  Gross per 24 hour  Intake 3038.09 ml  Output   1080 ml  Net 1958.09 ml   PHYSICAL EXAMINATION: General:  Chronically ill appearing male, old than stated age.  On full vent support. Neuro:  Unresponsive, not following commands, purposeful on right. W/d on left.no changes HEENT:   [place]/AT, PERRL  2rt left 3 Cardiovascular:  RRR, Nl S1/S2, -M/R/G. Lungs:  Scattered rhonchi  Abdomen:  Soft, NT, ND and +BS. Musculoskeletal:  -edema and -tenderness. Skin:  Intact.  LABS:  CBC  Recent Labs Lab 06/10/15 0328 06/11/15 0245 06/12/15 0306  WBC 9.6 12.5* 12.8*  HGB 14.1 13.9 13.0  HCT 41.6 42.7 39.9  PLT 83* 83* 90*   Coag's  Recent Labs Lab 06/05/15 2227 06/07/15 1050  APTT 32 34  INR 1.11 1.09   BMET  Recent Labs Lab 06/10/15 0328  06/11/15 0245  06/12/15 0306 06/12/15 0836 06/12/15 1526  NA 145  < > 155*  < > 155* 156* 155*  K 3.9  --  3.2*  --  3.0*  --   --   CL 118*  --  126*  --  124*  --   --   CO2 22  --  22  --  24  --   --   BUN 47*  --  26*  --  28*  --   --   CREATININE 1.78*  --  1.11  --  1.13  --   --   GLUCOSE 147*  --  155*  --  155*  --   --   < > = values in this interval not displayed. Electrolytes  Recent Labs Lab 06/09/15 1545 06/10/15 0328 06/11/15 0245 06/12/15 0306  CALCIUM  --  8.3* 8.2* 8.2*  MG 2.5* 2.5*  --  2.3  PHOS 7.8* 4.4  --  3.1   Sepsis Markers No results for input(s): LATICACIDVEN, PROCALCITON, O2SATVEN in the last 168 hours. ABG  Recent Labs Lab 06/09/15 1425 06/10/15 0513 06/12/15 0345  PHART 7.422 7.390 7.445  PCO2ART 28.6* 32.8* 33.9*  PO2ART 83.6 182* 137*    Liver Enzymes  Recent Labs Lab 06/07/15 0907 06/08/15 0215 06/11/15 0245  AST 180* 173* 68*  ALT 211* 216* 110*  ALKPHOS 76 82 57  BILITOT 0.9 1.1 1.0  ALBUMIN 3.6 3.7 2.9*   Cardiac Enzymes No results for input(s): TROPONINI, PROBNP in the last 168 hours. Glucose  Recent Labs Lab 06/11/15 1924 06/11/15 2324 06/12/15 0357 06/12/15 0925 06/12/15 1252 06/12/15 1623  GLUCAP 149* 126* 114* 108* 147* 180*   Imaging Ct Head Wo Contrast  06/12/2015   CLINICAL DATA:  Followup hemorrhagic stroke.  EXAM: CT HEAD WITHOUT CONTRAST  TECHNIQUE: Contiguous axial images were obtained from the base of the skull through the  vertex without intravenous contrast.  COMPARISON:  CT head June 09, 2015  FINDINGS: RIGHT thalamic/basal ganglia 4.5 x 3.5 cm dense intraparenchymal hematoma is unchanged in size, surrounding low-density vasogenic edema. Again noted is intraventricular extension with layering blood products in the occipital horn. Local mass effect, partially effaced lateral ventricle without obstructive hydrocephalus.  No acute large vascular territory infarct. Basal cisterns are patent. Mild calcific atherosclerosis of the carotid siphons.  Ocular globes and orbital contents are nonsuspicious, mild enophthalmos suspected. Visualized paranasal sinuses and mastoid air cells are well aerated.  IMPRESSION: Evolving RIGHT basal ganglia/ thalamic intraparenchymal hematoma with similar intraventricular extension and local mass effect. No hydrocephalus.   Electronically Signed   By: Elon Alas M.D.   On: 06/12/2015 03:00   Dg Chest Port 1 View  06/12/2015   CLINICAL DATA:  Intubated patient, history of intracranial hemorrhage, alcohol withdrawal, acute respiratory failure and hypoxia.  EXAM: PORTABLE CHEST - 1 VIEW  COMPARISON:  Portable chest x-ray of June 11, 2015  FINDINGS: The right lung is adequately inflated and clear. On the left the retrocardiac region remains dense. There remains partial obscuration of the hemidiaphragm. The heart is normal in size. The pulmonary vascularity is not engorged. The endotracheal tube tip lies 3.8 cm above the carina. The esophagogastric tube tip projects below the inferior margin of the image. The left internal jugular venous catheter tip projects over the midportion of the SVC.  IMPRESSION: Stable appearance of the chest since yesterday's study. There is persistent left lower lobe atelectasis. The support tubes are in reasonable position.   Electronically Signed   By: David  Martinique M.D.   On: 06/12/2015 07:56   ASSESSMENT / PLAN:  PULMONARY OETT 7/26>>> A: Intubated due to inability  to to protect his airway from [place]. Excellent Vt on PSV. MS still will not support airway protection  P:   ABG reviewed, consider reduction MV Wean sbt today, PS 10 , goal 2 hrs, unable to extubate pcxr without infiltrate  CARDIOVASCULAR CVL L IJ TLC 7/26>>> A: HTN likely due to [place]. P:  Target BP per neuro, Cardene restarted Continued norvasc, hctz, lopressor (unable to increase with some lows) Consider addition hydralazine SCDs.  RENAL A:  Therapeutic hypernatremia  Mild AKI Off 3% P:   Allow na 155 AM chemistry. D/C ACE I. k supp  GASTROINTESTINAL A:   Dysphagia Abnormal LFTs P:   Gastric tube. TF per nutrition. PPI. F/u  LFTs in am  HEMATOLOGIC A:  Leukocytosis without fever.  Likely stress response.   Hematuria. thrombocytopenia P:  CBC in AM. Transfuse per ICU protocol. SCD's. May need to consider transfusion plat in setting of plat less 100, ich, CT head size unchanged Follow trend Was on LMWH, if drops further, would send HITT if exposure greater than 5 days  INFECTIOUS A:  R/o infectious source P:   No cultures. pcxr unimpressive Zosyn started unclear to me Send UA repeat as more amber  ENDOCRINE A:  No known history.   P:   - Monitor.  NEUROLOGIC A:  ICH, edema, obtunded. P:   RASS goal: 0.  Propofol drip. Fentanyl pushes. 3% saline now off BP control Consider eeg for subclinical seziures  FAMILY  - Updates: no family available.   Ccm time 30 min   Lavon Paganini. Titus Mould, MD, [place] Pgr: [facility]

## 2015-06-12 NOTE — Progress Notes (Signed)
STROKE TEAM PROGRESS NOTE   HISTORY Mike Rowe is an 59 y.o. male with no reported past medical history (hasn't been to doctors in years) presents with acute onset of slurred speech and left sided weakness. LSW 2130 when he noted he was unable to stand or use his left side. EMS called, upon arrival noted BP of 240/140. Code stroke activated. CT head imaging reviewed, shows right sided thalamic/BG ICH. Denies taking any blood thinners.  He denies any past medical history including hypertension. Denies any surgical history. Denies alcohol or drug abuse.  Date last known well: 06/05/2015 Time last known well: 2130 tPA Given: no, ICH Modified Rankin: 0 ICH Score: 0  Initial NIHSS of 12   SUBJECTIVE (INTERVAL HISTORY) No changes   Last serum sodium was 155 on 3% saline. Remains sleepy and hard to arouse. Blood pressure required Cardene drip for control  Temp:  [99.1 F (37.3 C)-101.2 F (38.4 C)] 99.5 F (37.5 C) (07/29 1257) Pulse Rate:  [56-120] 75 (07/29 1228) Cardiac Rhythm:  [-] Normal sinus rhythm (07/29 0700) Resp:  [10-38] 14 (07/29 1228) BP: (129-183)/(55-88) 173/78 mmHg (07/29 1228) SpO2:  [93 %-100 %] 97 % (07/29 1228) FiO2 (%):  [40 %-50 %] 50 % (07/29 1229) Weight:  [147 lb 14.9 oz (67.1 kg)] 147 lb 14.9 oz (67.1 kg) (07/29 0500)   Recent Labs Lab 06/11/15 1924 06/11/15 2324 06/12/15 0357 06/12/15 0925 06/12/15 1252  GLUCAP 149* 126* 114* 108* 147*    Recent Labs Lab 06/07/15 0907 06/08/15 0215 06/09/15 1545  06/10/15 0328  06/11/15 0245 06/11/15 0910 06/11/15 1532 06/11/15 2015 06/12/15 0306 06/12/15 0836  NA 135 133* 138  < > 145  < > 155* 157* 154* 157* 155* 156*  K 3.9 4.5  --   --  3.9  --  3.2*  --   --   --  3.0*  --   CL 105 104  --   --  118*  --  126*  --   --   --  124*  --   CO2 21* 18*  --   --  22  --  22  --   --   --  24  --   GLUCOSE 107* 118*  --   --  147*  --  155*  --   --   --  155*  --   BUN 13 14  --   --  47*  --  26*  --   --    --  28*  --   CREATININE 0.82 0.90  --   --  1.78*  --  1.11  --   --   --  1.13  --   CALCIUM 8.7* 8.8*  --   --  8.3*  --  8.2*  --   --   --  8.2*  --   MG 2.1  --  2.5*  --  2.5*  --   --   --   --   --  2.3  --   PHOS 3.1  --  7.8*  --  4.4  --   --   --   --   --  3.1  --   < > = values in this interval not displayed.  Recent Labs Lab 06/05/15 2227 06/07/15 0907 06/08/15 0215 06/11/15 0245  AST 186* 180* 173* 68*  ALT 208* 211* 216* 110*  ALKPHOS 75 76 82 57  BILITOT 0.7 0.9 1.1  1.0  PROT 7.1 7.4 7.7 6.0*  ALBUMIN 3.8 3.6 3.7 2.9*    Recent Labs Lab 06/05/15 2227  06/07/15 0235 06/08/15 0215 06/10/15 0328 06/11/15 0245 06/12/15 0306  WBC 5.8  --  7.0 12.2* 9.6 12.5* 12.8*  NEUTROABS 3.5  --   --   --   --   --   --   HGB 15.8  < > 14.9 16.9 14.1 13.9 13.0  HCT 45.0  < > 43.1 48.0 41.6 42.7 39.9  MCV 92.6  --  91.5 92.1 95.0 97.9 97.1  PLT 69*  --  74* 103* 83* 83* 90*  < > = values in this interval not displayed. No results for input(s): CKTOTAL, CKMB, CKMBINDEX, TROPONINI in the last 168 hours. No results for input(s): LABPROT, INR in the last 72 hours.  Recent Labs  06/11/15 1637  COLORURINE RED*  LABSPEC 1.028  PHURINE 5.0  GLUCOSEU 100*  HGBUR LARGE*  BILIRUBINUR LARGE*  KETONESUR 15*  PROTEINUR >300*  UROBILINOGEN 1.0  NITRITE POSITIVE*  LEUKOCYTESUR MODERATE*       Component Value Date/Time   TRIG 183* 06/10/2015 1000   Lab Results  Component Value Date   HGBA1C 5.2 06/07/2015      Component Value Date/Time   LABOPIA NONE DETECTED 06/06/2015 0100   COCAINSCRNUR POSITIVE* 06/06/2015 0100   LABBENZ NONE DETECTED 06/06/2015 0100   AMPHETMU NONE DETECTED 06/06/2015 0100   THCU NONE DETECTED 06/06/2015 0100   LABBARB NONE DETECTED 06/06/2015 0100    No results for input(s): ETH in the last 168 hours.  Imaging   Ct Head Wo Contrast 06/05/2015    Acute right thalamic hemorrhage.     Ct Head Wo Contrast - ( Pre Fall ) 06/06/2015     Increase in size of right thalamic/posterior right internal capsule hematoma now with maximal transverse dimension 2.3 x 2.2 cm versus prior 2.1 x 1.6 cm.  Increase surrounding vasogenic edema and local mass effect with mild compression the right lateral ventricle without evidence of midline shift. Interval development of blood within the deep dependent aspect of the right lateral ventricle consistent with breakthrough of the right thalamic hemorrhage into the right lateral ventricle.   Ct Head Wo Contrast - ( Post fall ) 06/06/2015 Right for thalamic hemorrhage with a small amount of intraventricular blood appears unchanged. No new abnormality is seen.    PHYSICAL EXAM Neurologic Examination: Mental Status: Drowsy can barely opens eyes.  . Not able to follow any commands  Cranial Nerves: II: funduscopic exam not done bilaterally, visual fields cannot be reliably tested, pupils equal, round, reactive to light and accommodation III,IV, VI: ptosis not present, extra-ocular motions intact bilaterally V,VII: left facial weakness, facial light touch sensation normal bilaterally VIII: hearing unable to assess bilaterally IX,X: gag reflex present XII: tongue midline Motor:  Dense left hemiplegia arm more than leg. able to move right side purposefully against gravity Sensory: decreased LT and PP on left side Deep Tendon Reflexes: 2+ and symmetric throughout Plantars: Right: downgoingLeft: downgoing Cerebellar: Normal FTN and HTS on right side Gait: deferred   ASSESSMENT/PLAN [name] is a 59 y.o. male with no significant past medical history no regular medical follow-up presenting with left hemiparesis really elevated blood pressures. He did not receive IV t-PA due to acute right thalamic hemorrhage.   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  Resultant  left hemiparesis  MRI  not performed  MRA  not  performed  Carotid Doppler  not indicated  2D Echo Left ventricle: The cavity size was normal. Wall thickness was increased in a pattern of mild LVH. Systolic function was normal. The estimated ejection fraction was in the range of 60% to 65%. Wall motion was normal  LDL not indicated  HgbA1c 5.2  SCDs for VTE prophylaxis Diet NPO time specified  no antithrombotic prior to admission, now on no antithrombotic secondary to hemorrhage  Ongoing aggressive stroke risk factor management  Therapy recommendations: Pending -  .  Disposition:  Pending  Hypertension  Home meds:  No antihypertensives medications prior to admission  Stable    Induced Hypernatremia for Cytotoxic cerebral edema- on hypertonic saline sodium goal 150-155. Today Na 155   Other Stroke Risk Factors   ? Cigarette smoking and alcohol use  Substance abuse  Other Active Problems  Thrombocytopenia  Elevated liver function tests  Anxiety / agitation  UDS positive for cocaine  Mildly elevated BUN     Other Pertinent History  Substance abuse  ETOH use / abuse   PLAN  Continue to hold hypertonic saline with sodium goal 150-155.   Continue tight control of BP, normothermia, euvolemia Continue tube feeds and increase to goal as tolerated Continue hydrochlorothiazide and increase Norvasc to 5 mg bid for hypertension and taper Cardene  Repeat Ct head 06/15/15 am. Likely need trach/PEG/SNF next week  Hospital day # 7 I have personally examined this patient, reviewed notes, independently viewed imaging studies, participated in medical decision making and plan of care. I have made any additions or clarifications directly to the above note. This patient is critically ill and at significant risk of neurological worsening, death and care requires constant monitoring of vital signs, hemodynamics,respiratory and cardiac monitoring, extensive review of multiple databases, frequent neurological  assessment, discussion with family, other specialists and medical decision making of high complexity.I have made any additions or clarifications directly to the above note.This critical care time does not reflect procedure time, or teaching time or supervisory time of PA/NP/Med Resident etc but could involve care discussion time.  I spent 35 minutes of neurocritical care time  in the care of  this patient.  Antony Contras, MD Medical Director [facility] Pager: [phone] 06/12/2015 1:25 PM           To contact Stroke Continuity provider, please refer to [email or web address] After hours, contact General Neurology

## 2015-06-12 NOTE — Progress Notes (Signed)
OT Cancellation Note  Patient Details Name: Mike Rowe MRN: [id number] DOB: February 26, 1956   Cancelled Treatment:    Reason Eval/Treat Not Completed: Medical issues which prohibited therapy - Pt sedated.   Darlina Rumpf [place], OTR/L [phone]  06/12/2015, 3:31 PM

## 2015-06-12 NOTE — Progress Notes (Signed)
PT Cancellation Note  Patient Details Name: Mike Rowe MRN: [id number] DOB: August 25, 1956   Cancelled Treatment:    Reason Eval/Treat Not Completed: Patient not medically ready.  Vented and too sedated to participate.  Will see as able. 06/12/2015  Donnella Sham, [place] [phone]  (pager)   Roshni Burbano, Tessie Fass 06/12/2015, 4:01 PM

## 2015-06-13 DIAGNOSIS — R404 Transient alteration of awareness: Secondary | ICD-10-CM

## 2015-06-13 DIAGNOSIS — I610 Nontraumatic intracerebral hemorrhage in hemisphere, subcortical: Secondary | ICD-10-CM | POA: Insufficient documentation

## 2015-06-13 DIAGNOSIS — I618 Other nontraumatic intracerebral hemorrhage: Secondary | ICD-10-CM

## 2015-06-13 LAB — BASIC METABOLIC PANEL
ANION GAP: 8 (ref 5–15)
BUN: 29 mg/dL — ABNORMAL HIGH (ref 6–20)
CALCIUM: 8 mg/dL — AB (ref 8.9–10.3)
CO2: 27 mmol/L (ref 22–32)
Chloride: 118 mmol/L — ABNORMAL HIGH (ref 101–111)
Creatinine, Ser: 1.08 mg/dL (ref 0.61–1.24)
GFR calc non Af Amer: >60 mL/min (ref >60)
Glucose, Bld: 156 mg/dL — ABNORMAL HIGH (ref 65–99)
POTASSIUM: 2.7 mmol/L — AB (ref 3.5–5.1)
SODIUM: 153 mmol/L — AB (ref 135–145)

## 2015-06-13 LAB — URINE CULTURE: Culture: NO GROWTH

## 2015-06-13 LAB — GLUCOSE, CAPILLARY
GLUCOSE-CAPILLARY: 160 mg/dL — AB (ref 65–99)
Glucose-Capillary: 150 mg/dL — ABNORMAL HIGH (ref 65–99)
Glucose-Capillary: 138 mg/dL — ABNORMAL HIGH (ref 65–99)
Glucose-Capillary: 142 mg/dL — ABNORMAL HIGH (ref 65–99)
Glucose-Capillary: 139 mg/dL — ABNORMAL HIGH (ref 65–99)

## 2015-06-13 LAB — SODIUM
SODIUM: 155 mmol/L — AB (ref 135–145)
Sodium: 154 mmol/L — ABNORMAL HIGH (ref 135–145)

## 2015-06-13 LAB — TRIGLYCERIDES: Triglycerides: 156 mg/dL — ABNORMAL HIGH (ref <150)

## 2015-06-13 MED ORDER — INSULIN ASPART 100 UNIT/ML ~~LOC~~ SOLN
2.0000 [IU] | SUBCUTANEOUS | Status: DC
  Administered 2015-06-13 (×2): 2 [IU] via SUBCUTANEOUS
  Administered 2015-06-13: 4 [IU] via SUBCUTANEOUS
  Administered 2015-06-14 (×4): 2 [IU] via SUBCUTANEOUS
  Administered 2015-06-14: 4 [IU] via SUBCUTANEOUS
  Administered 2015-06-14 – 2015-06-15 (×3): 2 [IU] via SUBCUTANEOUS
  Administered 2015-06-15: 3 [IU] via SUBCUTANEOUS
  Administered 2015-06-15 – 2015-06-21 (×24): 2 [IU] via SUBCUTANEOUS
  Administered 2015-06-22: 4 [IU] via SUBCUTANEOUS
  Administered 2015-06-22: 2 [IU] via SUBCUTANEOUS
  Administered 2015-06-22: 4 [IU] via SUBCUTANEOUS
  Administered 2015-06-22 – 2015-06-26 (×11): 2 [IU] via SUBCUTANEOUS

## 2015-06-13 MED ORDER — POTASSIUM CHLORIDE 20 MEQ/15ML (10%) PO SOLN
20.0000 meq | Freq: Two times a day (BID) | ORAL | Status: DC
  Filled 2015-06-13: qty 15

## 2015-06-13 MED ORDER — AMLODIPINE BESYLATE 5 MG PO TABS
5.0000 mg | ORAL_TABLET | Freq: Once | ORAL | Status: DC
  Filled 2015-06-13: qty 1

## 2015-06-13 MED ORDER — LABETALOL HCL 100 MG PO TABS
100.0000 mg | ORAL_TABLET | Freq: Two times a day (BID) | ORAL | Status: DC
Start: 2015-06-13 — End: 2015-06-16
  Administered 2015-06-13 – 2015-06-15 (×5): 100 mg via ORAL
  Filled 2015-06-13 (×8): qty 1

## 2015-06-13 MED ORDER — AMLODIPINE BESYLATE 10 MG PO TABS: 10.0000 mg | ORAL_TABLET | Freq: Every day | ORAL | Status: DC

## 2015-06-13 MED ORDER — CLONIDINE HCL 0.1 MG PO TABS
0.1000 mg | ORAL_TABLET | Freq: Two times a day (BID) | ORAL | Status: DC
Start: 2015-06-13 — End: 2015-06-16
  Administered 2015-06-13 – 2015-06-16 (×7): 0.1 mg via ORAL
  Filled 2015-06-13 (×8): qty 1

## 2015-06-13 MED ORDER — AMLODIPINE BESYLATE 10 MG PO TABS
10.0000 mg | ORAL_TABLET | Freq: Every day | ORAL | Status: DC
  Administered 2015-06-14 – 2015-06-27 (×14): 10 mg via ORAL
  Filled 2015-06-13 (×14): qty 1

## 2015-06-13 MED ORDER — SODIUM CHLORIDE 0.9 % IV SOLN
INTRAVENOUS | Status: DC
  Filled 2015-06-13: qty 2.5

## 2015-06-13 MED ORDER — POTASSIUM CHLORIDE 20 MEQ/15ML (10%) PO SOLN
20.0000 meq | Freq: Two times a day (BID) | ORAL | Status: DC
  Administered 2015-06-13 – 2015-06-15 (×6): 20 meq via ORAL
  Filled 2015-06-13 (×10): qty 15

## 2015-06-13 MED ORDER — POTASSIUM CHLORIDE 10 MEQ/50ML IV SOLN
10.0000 meq | INTRAVENOUS | Status: AC
  Administered 2015-06-13 (×6): 10 meq via INTRAVENOUS
  Filled 2015-06-13 (×10): qty 50

## 2015-06-13 MED ORDER — POTASSIUM CHLORIDE 20 MEQ PO PACK
20.0000 meq | PACK | Freq: Two times a day (BID) | ORAL | Status: DC
  Filled 2015-06-13 (×2): qty 1

## 2015-06-13 NOTE — Progress Notes (Signed)
PULMONARY / CRITICAL CARE MEDICINE   Name: Mike Rowe MRN: [id number] DOB: 1955-12-23    ADMISSION DATE:  06/05/2015 CONSULTATION DATE:  06/09/2015  REFERRING MD :  Neurology  CHIEF COMPLAINT:  Acute respiratory failure due to inability to protect his airway.  INITIAL PRESENTATION: 59 year old male with no known PMH who presents on 7/22 with acute onset slurred speech and inability to stand.  Code stroke called, hemorrhagic thalamic stroke noted.  On 7/26 patient's CT and mental status worsened with cytotoxic edema and shift then was transferred to the ICU for inability to protect his airway.  Patient will also need hypertonic saline.  PCCM was called for respiratory failure and need for hypertonic saline.  STUDIES:  7/26 Head CT with edema and shift. 7/29- CT head evolving bleed, no change  SIGNIFICANT EVENTS: 7/26 Transfer to the ICU for intubation and 3% saline. 7/28 Hematuria  VITAL SIGNS: Temp:  [98.3 F (36.8 C)-99.9 F (37.7 C)] 99.8 F (37.7 C) (07/30 0849) Pulse Rate:  [52-115] 58 (07/30 0818) Resp:  [13-36] 15 (07/30 0818) BP: (132-233)/(55-104) 155/73 mmHg (07/30 0818) SpO2:  [92 %-99 %] 96 % (07/30 0818) FiO2 (%):  [40 %-50 %] 40 % (07/30 0817) Weight:  [148 lb 9.4 oz (67.4 kg)] 148 lb 9.4 oz (67.4 kg) (07/30 0500)   HEMODYNAMICS:     VENTILATOR SETTINGS: Vent Mode:  [-] PRVC FiO2 (%):  [40 %-50 %] 40 % Set Rate:  [16 bmp] 16 bmp Vt Set:  [550 mL] 550 mL PEEP:  [5 cmH20] 5 cmH20 Pressure Support:  [8 cmH20] 8 cmH20 Plateau Pressure:  [10 [id number] cmH20] 10 cmH20   INTAKE / OUTPUT:  Intake/Output Summary (Last 24 hours) at 06/13/15 0928 Last data filed at 06/13/15 0800  Gross per 24 hour  Intake 2932.51 ml  Output   1596 ml  Net 1336.51 ml   PHYSICAL EXAMINATION: General:  Chronically ill appearing HENT: NCAT, ETT in place PULM: CTA B CV: RRR, no mgr GI: BS+, soft, nontender MSK: normal bulk and tone Neuro: Sedated on  vent  LABS:  CBC  Recent Labs Lab 06/10/15 0328 06/11/15 0245 06/12/15 0306  WBC 9.6 12.5* 12.8*  HGB 14.1 13.9 13.0  HCT 41.6 42.7 39.9  PLT 83* 83* 90*   Coag's  Recent Labs Lab 06/07/15 1050  APTT 34  INR 1.09   BMET  Recent Labs Lab 06/10/15 0328  06/11/15 0245  06/12/15 0306  06/12/15 2056 06/13/15 0256 06/13/15 0815  NA 145  < > 155*  < > 155*  < > 156* 155* 154*  K 3.9  --  3.2*  --  3.0*  --   --   --   --   CL 118*  --  126*  --  124*  --   --   --   --   CO2 22  --  22  --  24  --   --   --   --   BUN 47*  --  26*  --  28*  --   --   --   --   CREATININE 1.78*  --  1.11  --  1.13  --   --   --   --   GLUCOSE 147*  --  155*  --  155*  --   --   --   --   < > = values in this interval not displayed. Electrolytes  Recent Labs Lab 06/09/15 1545  06/10/15 0328 06/11/15 0245 06/12/15 0306  CALCIUM  --  8.3* 8.2* 8.2*  MG 2.5* 2.5*  --  2.3  PHOS 7.8* 4.4  --  3.1   Sepsis Markers No results for input(s): LATICACIDVEN, PROCALCITON, O2SATVEN in the last 168 hours. ABG  Recent Labs Lab 06/09/15 1425 06/10/15 0513 06/12/15 0345  PHART 7.422 7.390 7.445  PCO2ART 28.6* 32.8* 33.9*  PO2ART 83.6 182* 137*    Liver Enzymes  Recent Labs Lab 06/07/15 0907 06/08/15 0215 06/11/15 0245  AST 180* 173* 68*  ALT 211* 216* 110*  ALKPHOS 76 82 57  BILITOT 0.9 1.1 1.0  ALBUMIN 3.6 3.7 2.9*   Cardiac Enzymes No results for input(s): TROPONINI, PROBNP in the last 168 hours. Glucose  Recent Labs Lab 06/12/15 1252 06/12/15 1623 06/12/15 1917 06/12/15 2303 06/13/15 0304 06/13/15 0830  GLUCAP 147* 180* 144* 143* 150* 138*   Imaging 7/29 CXR> ETT in place, lungs without infiltrate  ASSESSMENT / PLAN:  PULMONARY OETT 7/26>>> A: Acute respiratory failure due to inability to to protect his airway from ICH Excellent Vt on PSV. MS still will not support airway protection  P:   PSV daily as ablve No extubation until mental status  improves Daily WUA CXR again tomorrow AM  CARDIOVASCULAR CVL L IJ TLC 7/26>>> A: HTN  P:  Target BP per neuro, Cardene per neurology Continued norvasc, hctz, lopressor Tele  RENAL A:  Therapeutic hypernatremia  > goal Na 150-155 Mild AKI Off 3% for now P:   Monitor BMET and UOP Replace electrolytes as needed  GASTROINTESTINAL A:   Dysphagia Abnormal LFTs P:   Gastric tube TF per nutrition PPI Repeat LFTs in am  HEMATOLOGIC A:  Leukocytosis without fever.  Likely stress response Hematuria thrombocytopenia P:  Monitor PLT CBC in AM  INFECTIOUS A:  Fever, no clear source, U/A on 7/28 and 7/29 without pyuria; on Antibiotics P:   7/28 urine > 7/28 Blood >  7/28 resp > gpc in pairs  7/28 Vanc >  7/28 Zosyn >   ENDOCRINE A:  Mild hyperglycemia P:   Monitor glucose SSI  NEUROLOGIC A:  ICH, edema, obtunded P:   Per neurology RASS goal: 0  Propofol drip Fentanyl pushes BP control  FAMILY  - Updates: no family available   Ccm time 35 min   Roselie Awkward, MD [place] [facility] Pager: [phone] Cell: [phone] After 3pm or if no response, call [phone]

## 2015-06-13 NOTE — Progress Notes (Signed)
STROKE TEAM PROGRESS NOTE   HISTORY Mike Rowe is an 59 y.o. male with no reported past medical history (hasn't been to doctors in years) presents with acute onset of slurred speech and left sided weakness. LSW 2130 when he noted he was unable to stand or use his left side. EMS called, upon arrival noted BP of 240/140. Code stroke activated. CT head imaging reviewed, shows right sided thalamic/BG ICH. Denies taking any blood thinners.  He denies any past medical history including hypertension. Denies any surgical history. Denies alcohol or drug abuse.  Date last known well: 06/05/2015 Time last known well: 2130 tPA Given: no, ICH Modified Rankin: 0 ICH Score: 1  Initial NIHSS of 12   SUBJECTIVE (INTERVAL HISTORY):  Patient is agitated when propofol decreased.  In addition, he is very hypertensive.  Will need to adjust BP meds in order to wean sedation  No changes   Last serum sodium was 155 on 3% saline. Remains sleepy and hard to arouse. Blood pressure required Cardene drip for control     Temp:  [98.3 F (36.8 C)-99.9 F (37.7 C)] 99.8 F (37.7 C) (07/30 0849) Pulse Rate:  [52-115] 58 (07/30 0818) Cardiac Rhythm:  [-] Sinus tachycardia (07/30 0800) Resp:  [13-36] 15 (07/30 0818) BP: (132-233)/(55-104) 155/73 mmHg (07/30 0818) SpO2:  [92 %-99 %] 96 % (07/30 0818) FiO2 (%):  [40 %-50 %] 40 % (07/30 0817) Weight:  [67.4 kg (148 lb 9.4 oz)] 67.4 kg (148 lb 9.4 oz) (07/30 0500)   Recent Labs Lab 06/12/15 1623 06/12/15 1917 06/12/15 2303 06/13/15 0304 06/13/15 0830  GLUCAP 180* 144* 143* 150* 138*    Recent Labs Lab 06/07/15 0907 06/08/15 0215 06/09/15 1545  06/10/15 0328  06/11/15 0245  06/12/15 0306 06/12/15 0836 06/12/15 1526 06/12/15 2056 06/13/15 0256 06/13/15 0815  NA 135 133* 138  < > 145  < > 155*  < > 155* 156* 155* 156* 155* 154*  K 3.9 4.5  --   --  3.9  --  3.2*  --  3.0*  --   --   --   --   --   CL 105 104  --   --  118*  --  126*  --  124*  --   --    --   --   --   CO2 21* 18*  --   --  22  --  22  --  24  --   --   --   --   --   GLUCOSE 107* 118*  --   --  147*  --  155*  --  155*  --   --   --   --   --   BUN 13 14  --   --  47*  --  26*  --  28*  --   --   --   --   --   CREATININE 0.82 0.90  --   --  1.78*  --  1.11  --  1.13  --   --   --   --   --   CALCIUM 8.7* 8.8*  --   --  8.3*  --  8.2*  --  8.2*  --   --   --   --   --   MG 2.1  --  2.5*  --  2.5*  --   --   --  2.3  --   --   --   --   --  PHOS 3.1  --  7.8*  --  4.4  --   --   --  3.1  --   --   --   --   --   < > = values in this interval not displayed.  Recent Labs Lab 06/07/15 0907 06/08/15 0215 06/11/15 0245  AST 180* 173* 68*  ALT 211* 216* 110*  ALKPHOS 76 82 57  BILITOT 0.9 1.1 1.0  PROT 7.4 7.7 6.0*  ALBUMIN 3.6 3.7 2.9*    Recent Labs Lab 06/07/15 0235 06/08/15 0215 06/10/15 0328 06/11/15 0245 06/12/15 0306  WBC 7.0 12.2* 9.6 12.5* 12.8*  HGB 14.9 16.9 14.1 13.9 13.0  HCT 43.1 48.0 41.6 42.7 39.9  MCV 91.5 92.1 95.0 97.9 97.1  PLT 74* 103* 83* 83* 90*   No results for input(s): CKTOTAL, CKMB, CKMBINDEX, TROPONINI in the last 168 hours. No results for input(s): LABPROT, INR in the last 72 hours.  Recent Labs  06/11/15 1637 06/12/15 1733  COLORURINE RED* AMBER*  LABSPEC 1.028 1.030  PHURINE 5.0 5.5  GLUCOSEU 100* 100*  HGBUR LARGE* LARGE*  BILIRUBINUR LARGE* NEGATIVE  KETONESUR 15* 15*  PROTEINUR >300* 100*  UROBILINOGEN 1.0 1.0  NITRITE POSITIVE* NEGATIVE  LEUKOCYTESUR MODERATE* NEGATIVE       Component Value Date/Time   TRIG 183* 06/10/2015 1000   Lab Results  Component Value Date   HGBA1C 5.2 06/07/2015      Component Value Date/Time   LABOPIA NONE DETECTED 06/06/2015 0100   COCAINSCRNUR POSITIVE* 06/06/2015 0100   LABBENZ NONE DETECTED 06/06/2015 0100   AMPHETMU NONE DETECTED 06/06/2015 0100   THCU NONE DETECTED 06/06/2015 0100   LABBARB NONE DETECTED 06/06/2015 0100    No results for input(s): ETH in the last 168  hours.  Imaging   Ct Head Wo Contrast 06/05/2015    Acute right thalamic hemorrhage.     Ct Head Wo Contrast - ( Pre Fall ) 06/06/2015    Increase in size of right thalamic/posterior right internal capsule hematoma now with maximal transverse dimension 2.3 x 2.2 cm versus prior 2.1 x 1.6 cm.  Increase surrounding vasogenic edema and local mass effect with mild compression the right lateral ventricle without evidence of midline shift. Interval development of blood within the deep dependent aspect of the right lateral ventricle consistent with breakthrough of the right thalamic hemorrhage into the right lateral ventricle.   Ct Head Wo Contrast - ( Post fall ) 06/06/2015 Right for thalamic hemorrhage with a small amount of intraventricular blood appears unchanged. No new abnormality is seen.    PHYSICAL EXAM Neurologic Examination: Mental Status: Drowsy can barely opens eyes.  . Not able to follow any commands  Cranial Nerves: II: funduscopic exam not done bilaterally, visual fields cannot be reliably tested, pupils equal, round, reactive to light and accommodation III,IV, VI: ptosis not present, extra-ocular motions intact bilaterally V,VII: left facial weakness, facial light touch sensation normal bilaterally VIII: hearing unable to assess bilaterally IX,X: gag reflex present XII: tongue midline Motor:  Dense left hemiplegia arm more than leg. able to move right side purposefully against gravity Sensory: decreased LT and PP on left side Deep Tendon Reflexes: 2+ and symmetric throughout Plantars: Right: downgoingLeft: downgoing Cerebellar: Normal FTN and HTS on right side Gait: deferred   ASSESSMENT/PLAN [name] is a 59 y.o. male with no significant past medical history no regular medical follow-up presenting with left hemiparesis really elevated blood pressures. He did not receive IV t-PA due  to acute right thalamic hemorrhage.    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  Resultant  left hemiparesis  MRI  not performed  MRA  not performed   Carotid Doppler  not indicated  2D Echo Left ventricle: The cavity size was normal. Wall thickness was increased in a pattern of mild LVH. Systolic function was normal. The estimated ejection fraction was in the range of 60% to 65%. Wall motion was normal  LDL not indicated  HgbA1c 5.2  SCDs for VTE prophylaxis Diet NPO time specified  no antithrombotic prior to admission, now on no antithrombotic secondary to hemorrhage  Ongoing aggressive stroke risk factor management  Therapy recommendations: Pending   Disposition:  Pending  Hypertension  Home meds:  No antihypertensives medications prior to admission  Stable    Induced Hypernatremia for Cytotoxic cerebral edema- on hypertonic saline sodium goal 150-155. Today Na 155   Other Stroke Risk Factors   ? Cigarette smoking and alcohol use  Substance abuse    Other Active Problems  Thrombocytopenia  Elevated liver function tests  Anxiety / agitation  UDS positive for cocaine  Mildly elevated BUN     Other Pertinent History  Substance abuse  ETOH use / abuse  ATTENDING NOTE: Patient was seen and examined by me personally. I reviewed notes, independently viewed imaging studies, participated in medical decision making and plan of care. I have made additions or clarifications directly to the above note.  Documentation accurately reflects findings. The laboratory and radiographic studies were personally reviewed by me.  ROS completed by me personally and pertinent positives fully documented. OR  pertinent positives could not be fully documented due to LOC  Assessment and plan completed by me personally and fully documented above.  Plans include:    Neuro  Repeat CT of the head 06/15/2015  Cardiac  DC metoprolol, start labetalol 100 mg  twice daily, start clonidine 0.1 mg twice daily, change Norvasc to 10 mg daily, continue Apresoline 25 mg 3 times daily, DC hydrochlorothiazide.  Pulmonary  Chest x-ray tomorrow  Sputum culture pending - continue vancomycin and Zosyn  GI  Elevated liver function tests - history of alcohol abuse  Tube feedings - possible PEG next week.  Last BM - today  GU   Consider renal ultrasound if malignant hypertension continues.  Endo  Hemoglobin A1c 5.2  Sliding scale insulin increased  Heme / ID  Sputum culture pending - continue vancomycin and Zosyn  Electrolytes  Potassium 2.7 today - supplement and recheck in a.m. Slight  Check magnesium and phosphorus with labs tomorrow a.m.  Sodium 154 - discontinue every 6 hours sodium levels.  Condition is unchanged   This patient is critically ill and at significant risk of neurological worsening, death and care requires constant monitoring of vital signs, hemodynamics,respiratory and cardiac monitoring, extensive review of multiple databases, frequent neurological assessment, discussion with family, other specialists and medical decision making of high complexity.  This critical care time does not reflect procedure time, or teaching time or supervisory time of PA/NP/Med Resident etc. but could involve care discussion time.  I spent 30 minutes of Neurocritical Care time in the care of  this patient.  SIGNED BY: Dr. Laddie Aquas day # 8             To contact Stroke Continuity provider, please refer to [email or web address] After hours, contact General Neurology

## 2015-06-13 NOTE — Progress Notes (Signed)
CRITICAL VALUE ALERT  Critical value received:  Potassium, 2.7  Date of notification:  06/13/2015  Time of notification:  9480  Critical value read back:Yes.    Nurse who received alert:  Gilford Rile, RN  MD notified (1st page):  Elissa Hefty, MD  Time of first page:  1051  MD notified (2nd page):  Time of second page:  Responding MD:  Elissa Hefty, MD  Time MD responded:  1051

## 2015-06-14 ENCOUNTER — Inpatient Hospital Stay (HOSPITAL_COMMUNITY): Payer: Medicaid Other

## 2015-06-14 LAB — BASIC METABOLIC PANEL
Anion gap: 9 (ref 5–15)
BUN: 33 mg/dL — ABNORMAL HIGH (ref 6–20)
CO2: 26 mmol/L (ref 22–32)
Calcium: 8.2 mg/dL — ABNORMAL LOW (ref 8.9–10.3)
Chloride: 118 mmol/L — ABNORMAL HIGH (ref 101–111)
Creatinine, Ser: 0.91 mg/dL (ref 0.61–1.24)
GFR calc Af Amer: >60 mL/min (ref >60)
GFR calc non Af Amer: >60 mL/min (ref >60)
Glucose, Bld: 150 mg/dL — ABNORMAL HIGH (ref 65–99)
POTASSIUM: 3.2 mmol/L — AB (ref 3.5–5.1)
Sodium: 153 mmol/L — ABNORMAL HIGH (ref 135–145)

## 2015-06-14 LAB — CBC
HEMATOCRIT: 37.6 % — AB (ref 39.0–52.0)
HEMOGLOBIN: 12.4 g/dL — AB (ref 13.0–17.0)
MCH: 32.5 pg (ref 26.0–34.0)
MCHC: 33 g/dL (ref 30.0–36.0)
MCV: 98.4 fL (ref 78.0–100.0)
Platelets: 89 10*3/uL — ABNORMAL LOW (ref 150–400)
RBC: 3.82 MIL/uL — AB (ref 4.22–5.81)
RDW: 14.2 % (ref 11.5–15.5)
WBC: 6.9 10*3/uL (ref 4.0–10.5)

## 2015-06-14 LAB — GLUCOSE, CAPILLARY
GLUCOSE-CAPILLARY: 135 mg/dL — AB (ref 65–99)
Glucose-Capillary: 152 mg/dL — ABNORMAL HIGH (ref 65–99)
Glucose-Capillary: 122 mg/dL — ABNORMAL HIGH (ref 65–99)
Glucose-Capillary: 149 mg/dL — ABNORMAL HIGH (ref 65–99)
Glucose-Capillary: 131 mg/dL — ABNORMAL HIGH (ref 65–99)
Glucose-Capillary: 128 mg/dL — ABNORMAL HIGH (ref 65–99)
Glucose-Capillary: 143 mg/dL — ABNORMAL HIGH (ref 65–99)

## 2015-06-14 LAB — CULTURE, RESPIRATORY

## 2015-06-14 LAB — HEPATIC FUNCTION PANEL
ALT: 100 U/L — ABNORMAL HIGH (ref 17–63)
AST: 94 U/L — ABNORMAL HIGH (ref 15–41)
Albumin: 2.4 g/dL — ABNORMAL LOW (ref 3.5–5.0)
Alkaline Phosphatase: 44 U/L (ref 38–126)
BILIRUBIN DIRECT: 0.4 mg/dL (ref 0.1–0.5)
BILIRUBIN TOTAL: 1.1 mg/dL (ref 0.3–1.2)
Indirect Bilirubin: 0.7 mg/dL (ref 0.3–0.9)
Total Protein: 5.9 g/dL — ABNORMAL LOW (ref 6.5–8.1)

## 2015-06-14 LAB — CULTURE, RESPIRATORY W GRAM STAIN

## 2015-06-14 LAB — MAGNESIUM: MAGNESIUM: 2.4 mg/dL (ref 1.7–2.4)

## 2015-06-14 LAB — PHOSPHORUS: Phosphorus: 3.7 mg/dL (ref 2.5–4.6)

## 2015-06-14 MED ORDER — POTASSIUM CHLORIDE 20 MEQ/15ML (10%) PO SOLN
40.0000 meq | Freq: Once | ORAL | Status: AC
  Administered 2015-06-14: 40 meq
  Filled 2015-06-14: qty 30

## 2015-06-14 NOTE — Progress Notes (Signed)
PULMONARY / CRITICAL CARE MEDICINE   Name: Mike Rowe MRN: [id number] DOB: December 20, 1955    ADMISSION DATE:  06/05/2015 CONSULTATION DATE:  06/09/2015  REFERRING MD :  Neurology  CHIEF COMPLAINT:  Acute respiratory failure due to inability to protect his airway.  INITIAL PRESENTATION: 59 year old male with no known PMH who presents on 7/22 with acute onset slurred speech and inability to stand.  Code stroke called, hemorrhagic thalamic stroke noted.  On 7/26 patient's CT and mental status worsened with cytotoxic edema and shift then was transferred to the ICU for inability to protect his airway.  Patient will also need hypertonic saline.  PCCM was called for respiratory failure and need for hypertonic saline.  STUDIES:  7/26 Head CT with edema and shift. 7/29- CT head evolving bleed, no change  SIGNIFICANT EVENTS: 7/26 Transfer to the ICU for intubation and 3% saline. 7/28 Hematuria  Subjective: no acute events  VITAL SIGNS: Temp:  [98.6 F (37 C)-100.1 F (37.8 C)] 99.4 F (37.4 C) (07/31 0800) Pulse Rate:  [58-110] 63 (07/31 0900) Resp:  [12-27] 18 (07/31 0900) BP: (111-211)/(60-121) 148/74 mmHg (07/31 0900) SpO2:  [92 %-100 %] 97 % (07/31 0900) FiO2 (%):  [40 %] 40 % (07/31 0803) Weight:  [65.6 kg (144 lb 10 oz)] 65.6 kg (144 lb 10 oz) (07/31 0200)   HEMODYNAMICS:     VENTILATOR SETTINGS: Vent Mode:  [-] PSV;CPAP FiO2 (%):  [40 %] 40 % Set Rate:  [16 bmp] 16 bmp Vt Set:  [550 mL] 550 mL PEEP:  [5 cmH20] 5 cmH20 Pressure Support:  [8 cmH20] 8 cmH20 Plateau Pressure:  [12 cmH20-14 cmH20] 13 cmH20   INTAKE / OUTPUT:  Intake/Output Summary (Last 24 hours) at 06/14/15 1058 Last data filed at 06/14/15 0900  Gross per 24 hour  Intake 2039.3 ml  Output   1550 ml  Net  489.3 ml   PHYSICAL EXAMINATION: General:  Chronically ill appearing HENT: NCAT, ETT in place PULM: CTA B CV: RRR, no mgr GI: BS+, soft, nontender MSK: normal bulk and tone Neuro: Sedated on  vent  LABS:  CBC  Recent Labs Lab 06/11/15 0245 06/12/15 0306 06/14/15 0523  WBC 12.5* 12.8* 6.9  HGB 13.9 13.0 12.4*  HCT 42.7 39.9 37.6*  PLT 83* 90* 89*   Coag's No results for input(s): APTT, INR in the last 168 hours. BMET  Recent Labs Lab 06/12/15 0306  06/13/15 0256 06/13/15 0815 06/14/15 0523  NA 155*  < > 155* 153*  154* 153*  K 3.0*  --   --  2.7* 3.2*  CL 124*  --   --  118* 118*  CO2 24  --   --  27 26  BUN 28*  --   --  29* 33*  CREATININE 1.13  --   --  1.08 0.91  GLUCOSE 155*  --   --  156* 150*  < > = values in this interval not displayed. Electrolytes  Recent Labs Lab 06/10/15 0328  06/12/15 0306 06/13/15 0815 06/14/15 0523  CALCIUM 8.3*  < > 8.2* 8.0* 8.2*  MG 2.5*  --  2.3  --  2.4  PHOS 4.4  --  3.1  --  3.7  < > = values in this interval not displayed. Sepsis Markers No results for input(s): LATICACIDVEN, PROCALCITON, O2SATVEN in the last 168 hours. ABG  Recent Labs Lab 06/09/15 1425 06/10/15 0513 06/12/15 0345  PHART 7.422 7.390 7.445  PCO2ART 28.6* 32.8* 33.9*  PO2ART 83.6 182* 137*    Liver Enzymes  Recent Labs Lab 06/08/15 0215 06/11/15 0245 06/14/15 0523  AST 173* 68* 94*  ALT 216* 110* 100*  ALKPHOS 82 57 44  BILITOT 1.1 1.0 1.1  ALBUMIN 3.7 2.9* 2.4*   Cardiac Enzymes No results for input(s): TROPONINI, PROBNP in the last 168 hours. Glucose  Recent Labs Lab 06/13/15 1143 06/13/15 1635 06/13/15 1917 06/13/15 2310 06/14/15 0307 06/14/15 0838  GLUCAP 142* 139* 160* 135* 152* 122*   Imaging 7/31 CXR> ETT in place, lungs without infiltrate  ASSESSMENT / PLAN:  PULMONARY OETT 7/26>>> A: Acute respiratory failure due to inability to to protect his airway from ICH Excellent Vt on PSV. MS still will not support airway protection  P:   PSV daily No extubation until mental status improves Daily WUA CXR in AM  CARDIOVASCULAR CVL L IJ TLC 7/26>>> A: HTN severe, post stroke P:  Target BP per neuro,  BP management perCardene per neurology Continued norvasc, hctz, lopressor Tele  RENAL A:  Therapeutic hypernatremia  > goal Na 150-155 Mild AKI Hypokalemia P:   Monitor BMET and UOP Replace electrolytes as needed (IV and oral KCL 7/31)  GASTROINTESTINAL A:   Dysphagia Abnormal LFTs P:   Gastric tube TF per nutrition PPI Repeat LFTs in am Check hepatitis panel  HEMATOLOGIC A:  Leukocytosis resolved Hematuria Thrombocytopenia stable P:  Monitor PLT CBC in AM  INFECTIOUS A:  Fever, no clear source, U/A on 7/28 and 7/29 without pyuria; on Antibiotics P:   7/28 urine > 7/28 Blood >  7/28 resp > gpc in pairs  7/28 Vanc > 7/31 7/28 Zosyn >   Stop vanc today, hold zosyn tomorrow if afebrile  ENDOCRINE A:  Mild hyperglycemia P:   Monitor glucose SSI  NEUROLOGIC A:  ICH, edema, obtunded P:   Per neurology RASS goal: 0  Propofol drip Fentanyl pushes BP control  FAMILY  - Updates: no family available   Ccm time 35 min   Roselie Awkward, MD [place] [facility] Pager: [phone] Cell: [phone] After 3pm or if no response, call [phone]

## 2015-06-14 NOTE — Progress Notes (Signed)
STROKE TEAM PROGRESS NOTE   HISTORY Mike Rowe is an 59 y.o. male with no reported past medical history (hasn't been to doctors in years) presents with acute onset of slurred speech and left sided weakness. LSW 2130 when he noted he was unable to stand or use his left side. EMS called, upon arrival noted BP of 240/140. Code stroke activated. CT head imaging reviewed, shows right sided thalamic/BG ICH. Denies taking any blood thinners.  He denies any past medical history including hypertension. Denies any surgical history. Denies alcohol or drug abuse.  Date last known well: 06/05/2015 Time last known well: 2130 tPA Given: no, ICH Modified Rankin: 0 ICH Score: 1  Initial NIHSS of 12   SUBJECTIVE (INTERVAL HISTORY):  Patient is agitated when propofol decreased.  In addition, he is very hypertensive.  Will need to adjust BP meds in order to wean sedation   No changes   Last serum sodium was 155 on 3% saline. Remains sleepy and hard to arouse. Blood pressure required Cardene drip for control     Temp:  [98 F (36.7 C)-100.2 F (37.9 C)] 98 F (36.7 C) (07/31 1557) Pulse Rate:  [61-110] 83 (07/31 1700) Cardiac Rhythm:  [-] Normal sinus rhythm (07/31 0800) Resp:  [16-28] 28 (07/31 1700) BP: (111-201)/(60-104) 161/81 mmHg (07/31 1700) SpO2:  [94 %-100 %] 98 % (07/31 1700) FiO2 (%):  [40 %] 40 % (07/31 1700) Weight:  [65.6 kg (144 lb 10 oz)] 65.6 kg (144 lb 10 oz) (07/31 0200)   Recent Labs Lab 06/13/15 2310 06/14/15 0307 06/14/15 0838 06/14/15 1125 06/14/15 1554  GLUCAP 135* 152* 122* 149* 131*    Recent Labs Lab 06/09/15 1545  06/10/15 0328  06/11/15 0245  06/12/15 0306  06/12/15 1526 06/12/15 2056 06/13/15 0256 06/13/15 0815 06/14/15 0523  NA 138  < > 145  < > 155*  < > 155*  < > 155* 156* 155* 153*  154* 153*  K  --   --  3.9  --  3.2*  --  3.0*  --   --   --   --  2.7* 3.2*  CL  --   --  118*  --  126*  --  124*  --   --   --   --  118* 118*  CO2  --   --  22  --   22  --  24  --   --   --   --  27 26  GLUCOSE  --   --  147*  --  155*  --  155*  --   --   --   --  156* 150*  BUN  --   --  47*  --  26*  --  28*  --   --   --   --  29* 33*  CREATININE  --   --  1.78*  --  1.11  --  1.13  --   --   --   --  1.08 0.91  CALCIUM  --   --  8.3*  --  8.2*  --  8.2*  --   --   --   --  8.0* 8.2*  MG 2.5*  --  2.5*  --   --   --  2.3  --   --   --   --   --  2.4  PHOS 7.8*  --  4.4  --   --   --  3.1  --   --   --   --   --  3.7  < > = values in this interval not displayed.  Recent Labs Lab 06/08/15 0215 06/11/15 0245 06/14/15 0523  AST 173* 68* 94*  ALT 216* 110* 100*  ALKPHOS 82 57 44  BILITOT 1.1 1.0 1.1  PROT 7.7 6.0* 5.9*  ALBUMIN 3.7 2.9* 2.4*    Recent Labs Lab 06/08/15 0215 06/10/15 0328 06/11/15 0245 06/12/15 0306 06/14/15 0523  WBC 12.2* 9.6 12.5* 12.8* 6.9  HGB 16.9 14.1 13.9 13.0 12.4*  HCT 48.0 41.6 42.7 39.9 37.6*  MCV 92.1 95.0 97.9 97.1 98.4  PLT 103* 83* 83* 90* 89*   No results for input(s): CKTOTAL, CKMB, CKMBINDEX, TROPONINI in the last 168 hours. No results for input(s): LABPROT, INR in the last 72 hours.  Recent Labs  06/12/15 1733  COLORURINE AMBER*  LABSPEC 1.030  PHURINE 5.5  GLUCOSEU 100*  HGBUR LARGE*  BILIRUBINUR NEGATIVE  KETONESUR 15*  PROTEINUR 100*  UROBILINOGEN 1.0  NITRITE NEGATIVE  LEUKOCYTESUR NEGATIVE       Component Value Date/Time   TRIG 156* 06/13/2015 0815   Lab Results  Component Value Date   HGBA1C 5.2 06/07/2015      Component Value Date/Time   LABOPIA NONE DETECTED 06/06/2015 0100   COCAINSCRNUR POSITIVE* 06/06/2015 0100   LABBENZ NONE DETECTED 06/06/2015 0100   AMPHETMU NONE DETECTED 06/06/2015 0100   THCU NONE DETECTED 06/06/2015 0100   LABBARB NONE DETECTED 06/06/2015 0100    No results for input(s): ETH in the last 168 hours.  Imaging   Ct Head Wo Contrast 06/05/2015    Acute right thalamic hemorrhage.     Ct Head Wo Contrast - ( Pre Fall ) 06/06/2015     Increase in size of right thalamic/posterior right internal capsule hematoma now with maximal transverse dimension 2.3 x 2.2 cm versus prior 2.1 x 1.6 cm.  Increase surrounding vasogenic edema and local mass effect with mild compression the right lateral ventricle without evidence of midline shift. Interval development of blood within the deep dependent aspect of the right lateral ventricle consistent with breakthrough of the right thalamic hemorrhage into the right lateral ventricle.   Ct Head Wo Contrast - ( Post fall ) 06/06/2015 Right for thalamic hemorrhage with a small amount of intraventricular blood appears unchanged. No new abnormality is seen.    PHYSICAL EXAM  Neurologic Examination: Mental Status: On propofol; does not open eyes.  . Not able to follow any commands  Cranial Nerves: pupils equal, round, reactive to light  Iextra-ocular movement intact gag reflex present  Motor:  Dense left hemiplegia leg more than arm; able to move right side purposefully against gravity.  Noted with sedation holidays however patient did not follow commands Sensory: decreased to noxious stimuli Cerebellar and Gait Exams deferred  ASSESSMENT [name] is a 59 y.o. male with no significant past medical history no regular medical follow-up presenting with left hemiparesis really elevated blood pressures. He did not receive IV t-PA due to acute right thalamic hemorrhage.   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  Resultant  left hemiparesis  MRI  not performed  MRA  not performed   Carotid Doppler  not indicated  2D Echo Left ventricle: The cavity size was normal. Wall thickness was increased in a pattern of mild LVH. Systolic function was normal. The estimated ejection fraction was in the range of 60% to 65%. Wall motion  was normal  LDL not indicated  HgbA1c 5.2  SCDs for VTE prophylaxis Diet NPO time specified  no  antithrombotic prior to admission, now on no antithrombotic secondary to hemorrhage  Ongoing aggressive stroke risk factor management  Therapy recommendations: Pending   Disposition:  Pending  Hypertension  Home meds:  No antihypertensives medications prior to admission  Stable    Induced Hypernatremia for Cytotoxic cerebral edema- on hypertonic saline sodium goal 150-155. Today Na 155   Other Stroke Risk Factors   ? Cigarette smoking and alcohol use  Substance abuse    Other Active Problems  Thrombocytopenia  Elevated liver function tests  Anxiety / agitation  UDS positive for cocaine  Mildly elevated BUN   Other Pertinent History  Substance abuse  ETOH use / abuse  CRITICAL CARE NEUROLOGY ATTENDING NOTE: Patient was seen and examined by me personally. I reviewed notes, independently viewed imaging studies, participated in medical decision making and plan of care. I have made additions or clarifications directly to the above note.  Documentation accurately reflects findings. The laboratory and radiographic studies were personally reviewed by me.  ROS could not be fully documented due to LOC  Assessment and plan completed by me personally and fully documented above.  Plans include:    Neuro  Repeat CT of the head 06/15/2015  Consider Case Management consult for guardiansjp if family not located; patient will not be able to make decisions for himself at this time  Cardiac  DC metoprolol, start labetalol 100 mg twice daily, start clonidine 0.1 mg twice daily, change Norvasc to 10 mg daily, continue Apresoline 25 mg 3 times daily, DC hydrochlorothiazide.  Pulmonary  Chest x-ray tomorrow  Sputum culture pending - continue vancomycin and Zosyn  GI  Elevated liver function tests - history of alcohol abuse  Tube feedings - possible PEG next week.  Last BM - today  GU   Consider renal ultrasound if malignant hypertension  continues.  Endo  Hemoglobin A1c 5.2  Sliding scale insulin increased  Heme / ID  Sputum culture pending - continue vancomycin and Zosyn  Electrolytes  Potassium 2.7 today - supplement and recheck in a.m. Slight  Check magnesium and phosphorus with labs tomorrow a.m.  Sodium 154 - discontinue every 6 hours sodium levels.  Condition is unchanged   This patient is critically ill and at significant risk of neurological worsening, death and care requires constant monitoring of vital signs, hemodynamics,respiratory and cardiac monitoring, extensive review of multiple databases, frequent neurological assessment, discussion with family, other specialists and medical decision making of high complexity.  This critical care time does not reflect procedure time, or teaching time or supervisory time of PA/NP/Med Resident etc. but could involve care discussion time.  I spent 30 minutes of Neurocritical Care time in the care of  this patient.  SIGNED BY: Dr. Laddie Aquas day # 9    To contact Stroke Continuity provider, please refer to [email or web address] After hours, contact General Neurology

## 2015-06-15 ENCOUNTER — Encounter (HOSPITAL_COMMUNITY): Payer: Self-pay | Admitting: *Deleted

## 2015-06-15 ENCOUNTER — Inpatient Hospital Stay (HOSPITAL_COMMUNITY): Payer: Medicaid Other

## 2015-06-15 DIAGNOSIS — I611 Nontraumatic intracerebral hemorrhage in hemisphere, cortical: Secondary | ICD-10-CM

## 2015-06-15 DIAGNOSIS — G936 Cerebral edema: Secondary | ICD-10-CM

## 2015-06-15 LAB — CBC WITH DIFFERENTIAL/PLATELET
Basophils Absolute: 0 10*3/uL (ref 0.0–0.1)
Basophils Relative: 0 % (ref 0–1)
EOS ABS: 0.1 10*3/uL (ref 0.0–0.7)
EOS PCT: 1 % (ref 0–5)
HEMATOCRIT: 35.5 % — AB (ref 39.0–52.0)
Hemoglobin: 11.6 g/dL — ABNORMAL LOW (ref 13.0–17.0)
Lymphocytes Relative: 21 % (ref 12–46)
Lymphs Abs: 1.3 10*3/uL (ref 0.7–4.0)
MCH: 32.2 pg (ref 26.0–34.0)
MCHC: 32.7 g/dL (ref 30.0–36.0)
MCV: 98.6 fL (ref 78.0–100.0)
MONOS PCT: 9 % (ref 3–12)
Monocytes Absolute: 0.6 10*3/uL (ref 0.1–1.0)
Neutro Abs: 4.1 10*3/uL (ref 1.7–7.7)
Neutrophils Relative %: 68 % (ref 43–77)
Platelets: 90 10*3/uL — ABNORMAL LOW (ref 150–400)
RBC: 3.6 MIL/uL — AB (ref 4.22–5.81)
RDW: 14 % (ref 11.5–15.5)
WBC: 6.1 10*3/uL (ref 4.0–10.5)

## 2015-06-15 LAB — BASIC METABOLIC PANEL
ANION GAP: 4 — AB (ref 5–15)
BUN: 33 mg/dL — ABNORMAL HIGH (ref 6–20)
CO2: 28 mmol/L (ref 22–32)
Calcium: 8.2 mg/dL — ABNORMAL LOW (ref 8.9–10.3)
Chloride: 121 mmol/L — ABNORMAL HIGH (ref 101–111)
Creatinine, Ser: 0.88 mg/dL (ref 0.61–1.24)
GFR calc Af Amer: >60 mL/min (ref >60)
GFR calc non Af Amer: >60 mL/min (ref >60)
Glucose, Bld: 126 mg/dL — ABNORMAL HIGH (ref 65–99)
POTASSIUM: 3.4 mmol/L — AB (ref 3.5–5.1)
Sodium: 153 mmol/L — ABNORMAL HIGH (ref 135–145)

## 2015-06-15 LAB — GLUCOSE, CAPILLARY
GLUCOSE-CAPILLARY: 150 mg/dL — AB (ref 65–99)
GLUCOSE-CAPILLARY: 134 mg/dL — AB (ref 65–99)
GLUCOSE-CAPILLARY: 115 mg/dL — AB (ref 65–99)
Glucose-Capillary: 127 mg/dL — ABNORMAL HIGH (ref 65–99)
Glucose-Capillary: 138 mg/dL — ABNORMAL HIGH (ref 65–99)
Glucose-Capillary: 143 mg/dL — ABNORMAL HIGH (ref 65–99)

## 2015-06-15 LAB — HEPATITIS PANEL, ACUTE
HCV Ab: >11 s/co ratio — ABNORMAL HIGH (ref 0.0–0.9)
Hep A IgM: NEGATIVE
Hep B C IgM: NEGATIVE
Hepatitis B Surface Ag: NEGATIVE

## 2015-06-15 MED ORDER — CEFTRIAXONE SODIUM 1 G IJ SOLR
1.0000 g | INTRAMUSCULAR | Status: DC
  Filled 2015-06-15 (×2): qty 10

## 2015-06-15 MED ORDER — HYDRALAZINE HCL 50 MG PO TABS
50.0000 mg | ORAL_TABLET | Freq: Three times a day (TID) | ORAL | Status: DC
  Administered 2015-06-15 – 2015-06-16 (×2): 50 mg
  Filled 2015-06-15 (×6): qty 1

## 2015-06-15 MED ORDER — CEFTRIAXONE SODIUM IN DEXTROSE 20 MG/ML IV SOLN
1.0000 g | INTRAVENOUS | Status: AC
  Administered 2015-06-15 – 2015-06-17 (×3): 1 g via INTRAVENOUS
  Filled 2015-06-15 (×4): qty 50

## 2015-06-15 MED ORDER — DEXMEDETOMIDINE HCL IN NACL 200 MCG/50ML IV SOLN
0.0000 ug/kg/h | INTRAVENOUS | Status: DC
  Administered 2015-06-15: 0.4 ug/kg/h via INTRAVENOUS
  Administered 2015-06-15: 0.2 ug/kg/h via INTRAVENOUS
  Filled 2015-06-15 (×3): qty 50

## 2015-06-15 NOTE — Progress Notes (Signed)
Noticed irregularity in telemetry monitoring.  Rickard Patience, NP was notified.  STAT EKG was ordered and completed.  Will continue to monitor patient.

## 2015-06-15 NOTE — Progress Notes (Signed)
PULMONARY / CRITICAL CARE MEDICINE   Name: Mike Rowe MRN: [id number] DOB: 1956/07/25    ADMISSION DATE:  06/05/2015 CONSULTATION DATE:  06/09/2015  REFERRING MD :  Neurology  CHIEF COMPLAINT:  Acute respiratory failure due to inability to protect his airway.  INITIAL PRESENTATION: 59 year old male with no known PMH who presents on 7/22 with acute onset slurred speech and inability to stand.  Code stroke called, hemorrhagic thalamic stroke noted.  On 7/26 patient's CT and mental status worsened with cytotoxic edema and shift then was transferred to the ICU for inability to protect his airway.  Patient will also need hypertonic saline.  PCCM was called for respiratory failure and need for hypertonic saline.  STUDIES:  7/26 Head CT with edema and shift. 7/29- CT head evolving bleed, no change  SIGNIFICANT EVENTS: 7/26 Transfer to the ICU for intubation and 3% saline. 7/28 Hematuria  Subjective: weaned 8-10 hours yesterday  VITAL SIGNS: Temp:  [98 F (36.7 C)-100.2 F (37.9 C)] 100.1 F (37.8 C) (08/01 0723) Pulse Rate:  [62-85] 73 (08/01 0740) Resp:  [16-28] 22 (08/01 0740) BP: (128-192)/(66-98) 176/85 mmHg (08/01 0740) SpO2:  [97 %-100 %] 98 % (08/01 0740) FiO2 (%):  [0.9 %-40 %] 40 % (08/01 0740) Weight:  [68.1 kg (150 lb 2.1 oz)] 68.1 kg (150 lb 2.1 oz) (08/01 0325)   HEMODYNAMICS:     VENTILATOR SETTINGS: Vent Mode:  [-] PSV;CPAP FiO2 (%):  [0.9 %-40 %] 40 % Set Rate:  [16 bmp] 16 bmp Vt Set:  [550 mL] 550 mL PEEP:  [5 cmH20] 5 cmH20 Pressure Support:  [8 cmH20] 8 cmH20 Plateau Pressure:  [13 cmH20-14 cmH20] 14 cmH20   INTAKE / OUTPUT:  Intake/Output Summary (Last 24 hours) at 06/15/15 0845 Last data filed at 06/15/15 0700  Gross per 24 hour  Intake 1841.98 ml  Output   1710 ml  Net 131.98 ml   PHYSICAL EXAMINATION: General:  Chronically ill appearing on vent HENT: NCAT, ETT in place PULM: CTA B CV: RRR, no mgr GI: BS+, soft, nontender MSK: normal bulk  and tone Neuro: Sedated on vent  LABS:  CBC  Recent Labs Lab 06/12/15 0306 06/14/15 0523 06/15/15 0440  WBC 12.8* 6.9 6.1  HGB 13.0 12.4* 11.6*  HCT 39.9 37.6* 35.5*  PLT 90* 89* 90*   Coag's No results for input(s): APTT, INR in the last 168 hours. BMET  Recent Labs Lab 06/13/15 0815 06/14/15 0523 06/15/15 0440  NA 153*  154* 153* 153*  K 2.7* 3.2* 3.4*  CL 118* 118* 121*  CO2 27 26 28   BUN 29* 33* 33*  CREATININE 1.08 0.91 0.88  GLUCOSE 156* 150* 126*   Electrolytes  Recent Labs Lab 06/10/15 0328  06/12/15 0306 06/13/15 0815 06/14/15 0523 06/15/15 0440  CALCIUM 8.3*  < > 8.2* 8.0* 8.2* 8.2*  MG 2.5*  --  2.3  --  2.4  --   PHOS 4.4  --  3.1  --  3.7  --   < > = values in this interval not displayed. Sepsis Markers No results for input(s): LATICACIDVEN, PROCALCITON, O2SATVEN in the last 168 hours. ABG  Recent Labs Lab 06/09/15 1425 06/10/15 0513 06/12/15 0345  PHART 7.422 7.390 7.445  PCO2ART 28.6* 32.8* 33.9*  PO2ART 83.6 182* 137*    Liver Enzymes  Recent Labs Lab 06/11/15 0245 06/14/15 0523  AST 68* 94*  ALT 110* 100*  ALKPHOS 57 44  BILITOT 1.0 1.1  ALBUMIN 2.9* 2.4*  Cardiac Enzymes No results for input(s): TROPONINI, PROBNP in the last 168 hours. Glucose  Recent Labs Lab 06/14/15 1125 06/14/15 1554 06/14/15 1926 06/14/15 2304 06/15/15 0345 06/15/15 0721  GLUCAP 149* 131* 128* 143* 127* 138*   Imaging 7/31 CXR> ETT in place, lungs without infiltrate  ASSESSMENT / PLAN:  PULMONARY OETT 7/26>>> A: Acute respiratory failure due to inability to to protect his airway from ICH Excellent Vt on PSV. MS still will not support airway protection  P:   PSV daily No extubation until mental status improves > may need trach Daily WUA CXR in AM  CARDIOVASCULAR CVL L IJ TLC 7/26>>> A: HTN severe, post stroke P:  Target BP per neuro, BP management per Cardene per neurology Continued norvasc, increase hydralazine, continue  labetalol Careful titrating clonidine with mental status Tele  RENAL A:  Therapeutic hypernatremia  > goal Na 150-155 Mild AKI Hypokalemia P:   Monitor BMET and UOP Replace electrolytes as needed oral again 8/1  GASTROINTESTINAL A:   Dysphagia Abnormal LFTs P:   Gastric tube TF per nutrition PPI Repeat LFTs in am F/U hepatitis panel   HEMATOLOGIC A:  Leukocytosis resolved Hematuria Thrombocytopenia stable P:  Monitor PLT CBC in AM  INFECTIOUS A:  Fever, no clear source, U/A on 7/28 and 7/29 without pyuria; on Antibiotics P:   7/28 urine > 7/28 Blood >  7/28 resp > group a strep  7/28 Vanc > 7/31 7/28 Zosyn > 8/1  Stop vanc today, hold zosyn tomorrow if afebrile  ENDOCRINE A:  Mild hyperglycemia P:   Monitor glucose SSI  NEUROLOGIC A:  ICH, edema, obtunded P:   Per neurology RASS goal: 0 Change Propofol drip to precedex Fentanyl pushes prn BP control  FAMILY  - Updates: no family available > have been working to try to find a family contact.  None available at this time.   Ccm time 35 min   Roselie Awkward, MD [place] Pager: [phone] Cell: [phone] After 3pm or if no response, call [phone]

## 2015-06-15 NOTE — Progress Notes (Signed)
STROKE TEAM PROGRESS NOTE   HISTORY Mike Rowe is an 59 y.o. male with no reported past medical history (hasn't been to doctors in years) presents with acute onset of slurred speech and left sided weakness. LSW 2130 when he noted he was unable to stand or use his left side. EMS called, upon arrival noted BP of 240/140. Code stroke activated. CT head imaging reviewed, shows right sided thalamic/BG ICH. Denies taking any blood thinners.  He denies any past medical history including hypertension. Denies any surgical history. Denies alcohol or drug abuse.  Date last known well: 06/05/2015 Time last known well: 2130 tPA Given: no, ICH Modified Rankin: 0 ICH Score: 1  Initial NIHSS of 12   SUBJECTIVE (INTERVAL HISTORY):  Patient is agitated when propofol decreased.  In addition, he is very hypertensive.  Will need to adjust BP meds in order to wean sedation   No changes   Last serum sodium was 155 on 3% saline. Remains sleepy and hard to arouse. Blood pressure required Cardene drip for control     Temp:  [98 F (36.7 C)-100.4 F (38 C)] 100.4 F (38 C) (08/01 2344) Pulse Rate:  [50-93] 93 (08/01 2200) Cardiac Rhythm:  [-] Sinus bradycardia;Normal sinus rhythm (08/01 1930) Resp:  [13-25] 24 (08/01 2200) BP: (114-200)/(54-149) 158/79 mmHg (08/01 2130) SpO2:  [97 %-100 %] 100 % (08/01 2200) FiO2 (%):  [0.9 %-40 %] 40 % (08/01 2200) Weight:  [150 lb 2.1 oz (68.1 kg)] 150 lb 2.1 oz (68.1 kg) (08/01 0325)   Recent Labs Lab 06/15/15 0721 06/15/15 1215 06/15/15 1648 06/15/15 1916 06/15/15 2305  GLUCAP 138* 150* 134* 115* 143*    Recent Labs Lab 06/09/15 1545  06/10/15 0328  06/11/15 0245  06/12/15 0306  06/12/15 2056 06/13/15 0256 06/13/15 0815 06/14/15 0523 06/15/15 0440  NA 138  < > 145  < > 155*  < > 155*  < > 156* 155* 153*  154* 153* 153*  K  --   < > 3.9  --  3.2*  --  3.0*  --   --   --  2.7* 3.2* 3.4*  CL  --   < > 118*  --  126*  --  124*  --   --   --  118* 118*  121*  CO2  --   < > 22  --  22  --  24  --   --   --  27 26 28   GLUCOSE  --   < > 147*  --  155*  --  155*  --   --   --  156* 150* 126*  BUN  --   < > 47*  --  26*  --  28*  --   --   --  29* 33* 33*  CREATININE  --   < > 1.78*  --  1.11  --  1.13  --   --   --  1.08 0.91 0.88  CALCIUM  --   < > 8.3*  --  8.2*  --  8.2*  --   --   --  8.0* 8.2* 8.2*  MG 2.5*  --  2.5*  --   --   --  2.3  --   --   --   --  2.4  --   PHOS 7.8*  --  4.4  --   --   --  3.1  --   --   --   --  3.7  --   < > = values in this interval not displayed.  Recent Labs Lab 06/11/15 0245 06/14/15 0523  AST 68* 94*  ALT 110* 100*  ALKPHOS 57 44  BILITOT 1.0 1.1  PROT 6.0* 5.9*  ALBUMIN 2.9* 2.4*    Recent Labs Lab 06/10/15 0328 06/11/15 0245 06/12/15 0306 06/14/15 0523 06/15/15 0440  WBC 9.6 12.5* 12.8* 6.9 6.1  NEUTROABS  --   --   --   --  4.1  HGB 14.1 13.9 13.0 12.4* 11.6*  HCT 41.6 42.7 39.9 37.6* 35.5*  MCV 95.0 97.9 97.1 98.4 98.6  PLT 83* 83* 90* 89* 90*   No results for input(s): CKTOTAL, CKMB, CKMBINDEX, TROPONINI in the last 168 hours. No results for input(s): LABPROT, INR in the last 72 hours. No results for input(s): COLORURINE, LABSPEC, [place], GLUCOSEU, HGBUR, BILIRUBINUR, KETONESUR, PROTEINUR, UROBILINOGEN, NITRITE, LEUKOCYTESUR in the last 72 hours.  Invalid input(s): APPERANCEUR     Component Value Date/Time   TRIG 156* 06/13/2015 0815   Lab Results  Component Value Date   HGBA1C 5.2 06/07/2015      Component Value Date/Time   LABOPIA NONE DETECTED 06/06/2015 0100   COCAINSCRNUR POSITIVE* 06/06/2015 0100   LABBENZ NONE DETECTED 06/06/2015 0100   AMPHETMU NONE DETECTED 06/06/2015 0100   THCU NONE DETECTED 06/06/2015 0100   LABBARB NONE DETECTED 06/06/2015 0100    No results for input(s): ETH in the last 168 hours.  Imaging   Ct Head Wo Contrast 06/05/2015    Acute right thalamic hemorrhage.     Ct Head Wo Contrast - ( Pre Fall ) 06/06/2015    Increase in size of  right thalamic/posterior right internal capsule hematoma now with maximal transverse dimension 2.3 x 2.2 cm versus prior 2.1 x 1.6 cm.  Increase surrounding vasogenic edema and local mass effect with mild compression the right lateral ventricle without evidence of midline shift. Interval development of blood within the deep dependent aspect of the right lateral ventricle consistent with breakthrough of the right thalamic hemorrhage into the right lateral ventricle.   Ct Head Wo Contrast - ( Post fall ) 06/06/2015 Right for thalamic hemorrhage with a small amount of intraventricular blood appears unchanged. No new abnormality is seen.    PHYSICAL EXAM   Temp:  [98 F (36.7 C)-100.4 F (38 C)] 100.4 F (38 C) (08/01 2344) Pulse Rate:  [50-93] 83 (08/01 2354) Resp:  [13-25] 24 (08/01 2354) BP: (114-200)/(54-149) 158/79 mmHg (08/01 2130) SpO2:  [97 %-100 %] 100 % (08/01 2354) FiO2 (%):  [0.9 %-40 %] 40 % (08/01 2354) Weight:  [150 lb 2.1 oz (68.1 kg)] 150 lb 2.1 oz (68.1 kg) (08/01 0325)  General - Well nourished, well developed, intubated and not following commands.  Ophthalmologic - Fundi not visualized due to noncooperation.  Cardiovascular - Regular rate and rhythm.  Neuro - intubated and sedated with propofol. Pt barely open eyes on pain stimulation but not following commmands. PERRL, doll's eyes present, positive corneal and gag and cough, breathing over the vent. On pain stimulation, left UE and LE withdraw to pain, right UE and LE spontaneous movement. Babinski positive on the left. DTR decreased on the left. Sensation, coordination and gait not tested.   ASSESSMENT [name] is a 59 y.o. male with no significant past medical history no regular medical follow-up presenting with left hemiparesis really elevated blood pressures. He did not receive IV t-PA due to acute right thalamic hemorrhage.   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  Hematoma had interval enlargement due to agitation and high BP not at goal.   Resultant  left hemiparesis, less responsive  MRI  not performed  MRA  not performed   Carotid Doppler  pending  2D Echo - unremarkable  LDL pending  HgbA1c 5.2  SCDs for VTE prophylaxis Diet NPO time specified  no antithrombotic prior to admission, now on no antithrombotic secondary to hemorrhage  Ongoing aggressive stroke risk factor management  Therapy recommendations: Pending   Disposition:  Pending  Cerebral edema  Off 3% saline now  Na 153  Close monitoring  Recommend change propofol to precedex  Cocaine abuse  Positive on UDS  Hypertension  Home meds:  No antihypertensives medications prior to admission  Unstable  On norvasc, clonidine, hydralazine, labetalol  Other Stroke Risk Factors  Cigarette smoking and alcohol use  Other Active Problems  Thrombocytopenia  Elevated liver function tests  Anxiety / agitation  Mildly elevated BUN   Other Pertinent History  No family contact yet  Hospital day # 10  This patient is critically ill due to right ICH and respiratory failue and at significant risk of neurological worsening, death form rebleeding and seizure and cerebral edema. This patient's care requires constant monitoring of vital signs, hemodynamics, respiratory and cardiac monitoring, review of multiple databases, neurological assessment, discussion with family, other specialists and medical decision making of high complexity. I spent 35 minutes of neurocritical care time in the care of this patient.  Rosalin Hawking, MD PhD Stroke Neurology 06/16/2015 12:16 AM    To contact Stroke Continuity provider, please refer to [email or web address] After hours, contact General Neurology

## 2015-06-15 NOTE — Progress Notes (Signed)
PT Cancellation Note  Patient Details Name: Mike Rowe MRN: [id number] DOB: Jun 28, 1956   Cancelled Treatment:    Reason Treat Not Completed: Patient not medically ready. Noted sedation has been changed from propofol to precedex. Per RN they are attempting to lessen sedation, however pt continues to become agitated. Currently too sedated to benefit from therapy. Will continue to follow and proceed with therapy as appropriate.   Niylah Hassan 06/15/2015, 12:23 PM  Pager [phone]

## 2015-06-15 NOTE — Clinical Social Work Note (Signed)
Clinical Social Worker continuing to follow patient for support and attempts to locate family.  CSW was able to get in contact with patient previous employer, Mike Rowe, who states that patient worked for him in [place] about 6 years ago and left on unsettling terms to move to [place] about 2 years ago.  Mike Rowe remembers patient speaking of a son Mike Rowe. Goes by [name] and an ex wife.  Throughout the original conversation, Mike Rowe requested no involvement in patient care, however Mike Rowe returned call several hours later asking to provide assistance on patient behalf.  Mike Rowe states that he plans to communicate with patient friends in [place] to determine if anyone has contact information for patient family members.  CSW has updated supervisor and contacted [facility] regarding potential guardianship needs.  CSW remains available for support and to assist with discharge planning needs.  Mike Rowe, [name]

## 2015-06-16 ENCOUNTER — Encounter (HOSPITAL_COMMUNITY): Payer: Self-pay

## 2015-06-16 DIAGNOSIS — B182 Chronic viral hepatitis C: Secondary | ICD-10-CM

## 2015-06-16 DIAGNOSIS — E785 Hyperlipidemia, unspecified: Secondary | ICD-10-CM

## 2015-06-16 LAB — CULTURE, BLOOD (ROUTINE X 2)
Culture: NO GROWTH
Culture: NO GROWTH

## 2015-06-16 LAB — LIPID PANEL
CHOL/HDL RATIO: 9.6 ratio
CHOLESTEROL: 153 mg/dL (ref 0–200)
HDL: 16 mg/dL — AB (ref >40)
LDL CALC: 97 mg/dL (ref 0–99)
Triglycerides: 198 mg/dL — ABNORMAL HIGH (ref <150)
VLDL: 40 mg/dL (ref 0–40)

## 2015-06-16 LAB — BASIC METABOLIC PANEL
Anion gap: 7 (ref 5–15)
BUN: 32 mg/dL — ABNORMAL HIGH (ref 6–20)
CO2: 25 mmol/L (ref 22–32)
Calcium: 8.4 mg/dL — ABNORMAL LOW (ref 8.9–10.3)
Chloride: 119 mmol/L — ABNORMAL HIGH (ref 101–111)
Creatinine, Ser: 0.81 mg/dL (ref 0.61–1.24)
GFR calc Af Amer: >60 mL/min (ref >60)
GFR calc non Af Amer: >60 mL/min (ref >60)
Glucose, Bld: 163 mg/dL — ABNORMAL HIGH (ref 65–99)
Potassium: 3.3 mmol/L — ABNORMAL LOW (ref 3.5–5.1)
SODIUM: 151 mmol/L — AB (ref 135–145)

## 2015-06-16 LAB — GLUCOSE, CAPILLARY
GLUCOSE-CAPILLARY: 128 mg/dL — AB (ref 65–99)
GLUCOSE-CAPILLARY: 130 mg/dL — AB (ref 65–99)
GLUCOSE-CAPILLARY: 100 mg/dL — AB (ref 65–99)
GLUCOSE-CAPILLARY: 107 mg/dL — AB (ref 65–99)
Glucose-Capillary: 144 mg/dL — ABNORMAL HIGH (ref 65–99)
Glucose-Capillary: 119 mg/dL — ABNORMAL HIGH (ref 65–99)

## 2015-06-16 LAB — TRIGLYCERIDES: Triglycerides: 176 mg/dL — ABNORMAL HIGH (ref <150)

## 2015-06-16 MED ORDER — HALOPERIDOL LACTATE 5 MG/ML IJ SOLN: 1.0000 mg | INTRAMUSCULAR | Status: DC | PRN

## 2015-06-16 MED ORDER — CHLORHEXIDINE GLUCONATE 0.12 % MT SOLN
15.0000 mL | Freq: Two times a day (BID) | OROMUCOSAL | Status: DC
  Administered 2015-06-16 – 2015-06-27 (×21): 15 mL via OROMUCOSAL
  Filled 2015-06-16 (×15): qty 15

## 2015-06-16 MED ORDER — CETYLPYRIDINIUM CHLORIDE 0.05 % MT LIQD
7.0000 mL | Freq: Two times a day (BID) | OROMUCOSAL | Status: DC
  Administered 2015-06-16 – 2015-06-27 (×23): 7 mL via OROMUCOSAL

## 2015-06-16 MED ORDER — CLONIDINE HCL 0.1 MG PO TABS
0.2000 mg | ORAL_TABLET | Freq: Three times a day (TID) | ORAL | Status: DC
  Administered 2015-06-17 – 2015-06-26 (×26): 0.2 mg via ORAL
  Filled 2015-06-16 (×3): qty 2
  Filled 2015-06-16: qty 1
  Filled 2015-06-16 (×4): qty 2
  Filled 2015-06-16 (×2): qty 1
  Filled 2015-06-16: qty 2
  Filled 2015-06-16: qty 1
  Filled 2015-06-16 (×3): qty 2
  Filled 2015-06-16: qty 1
  Filled 2015-06-16 (×6): qty 2
  Filled 2015-06-16: qty 1
  Filled 2015-06-16 (×5): qty 2
  Filled 2015-06-16: qty 1
  Filled 2015-06-16: qty 2
  Filled 2015-06-16: qty 1
  Filled 2015-06-16: qty 2

## 2015-06-16 MED ORDER — POTASSIUM CHLORIDE 20 MEQ/15ML (10%) PO SOLN
40.0000 meq | Freq: Two times a day (BID) | ORAL | Status: DC
  Administered 2015-06-16: 40 meq via ORAL
  Filled 2015-06-16: qty 30

## 2015-06-16 MED ORDER — LABETALOL HCL 200 MG PO TABS
200.0000 mg | ORAL_TABLET | Freq: Two times a day (BID) | ORAL | Status: DC
  Administered 2015-06-16 – 2015-06-18 (×5): 200 mg via ORAL
  Filled 2015-06-16 (×7): qty 1

## 2015-06-16 MED ORDER — POTASSIUM CHLORIDE 10 MEQ/100ML IV SOLN
10.0000 meq | INTRAVENOUS | Status: AC
  Administered 2015-06-16 – 2015-06-17 (×4): 10 meq via INTRAVENOUS
  Filled 2015-06-16 (×4): qty 100

## 2015-06-16 MED ORDER — HYDRALAZINE HCL 50 MG PO TABS
100.0000 mg | ORAL_TABLET | Freq: Three times a day (TID) | ORAL | Status: DC
  Administered 2015-06-17 – 2015-06-27 (×26): 100 mg
  Filled 2015-06-16 (×18): qty 2
  Filled 2015-06-16: qty 4
  Filled 2015-06-16 (×3): qty 2
  Filled 2015-06-16: qty 4
  Filled 2015-06-16 (×11): qty 2

## 2015-06-16 NOTE — Progress Notes (Signed)
Physical Therapy Treatment Patient Details Name: Mike Rowe MRN: [id number] DOB: Feb 04, 1956 Today's Date: 06/16/2015    History of Present Illness Mike Rowe is an 59 y.o. male with no reported past medical history (hasn't been to doctors in years) presents with acute onset of slurred speech and left sided weakness. LSW 2130 when he noted he was unable to stand or use his left side. EMS called, upon arrival noted BP of 240/140. Code stroke activated. CT head imaging reviewed, shows right sided thalamic/BG ICH. 7/26 Transfer to the ICU for intubation and 3% saline. Extubated 8/2    PT Comments    Patient has been sedated and on ventilator since 7/26. Extubated this morning and remained lethargic during session. Did follow 1 step commands with rt extremities with incr time. No righting reactions noted in sitting when allowed to lose his balance posteriorly or to his left. Some pusher-syndrome tendencies with RUE, however also would use for appropriate support in sitting at times. Overall, appears more impaired post-ventilator support with ? continued effects of sedation.    Follow Up Recommendations  SNF;Supervision/Assistance - 24 hour     Equipment Recommendations   (TBD)    Recommendations for Other Services       Precautions / Restrictions Precautions Precautions: Fall    Mobility  Bed Mobility Overal bed mobility: Needs Assistance;+2 for physical assistance;+ 2 for safety/equipment Bed Mobility: Supine to Sit;Sit to Sidelying     Supine to sit: Total assist;+2 for physical assistance   Sit to sidelying: Max assist;+2 for physical assistance;+2 for safety/equipment General bed mobility comments: no attempt to pull to sit; did use RUE to lower to his side and raised RLE mostly up onto bed  Transfers                 General transfer comment: unable   Ambulation/Gait                 Stairs            Wheelchair Mobility    Modified Rankin (Stroke  Patients Only) Modified Rankin (Stroke Patients Only) Pre-Morbid Rankin Score: No symptoms Modified Rankin: Severe disability     Balance Overall balance assessment: Needs assistance Sitting-balance support: Single extremity supported;No upper extremity supported;Feet supported Sitting balance-Leahy Scale: Zero Sitting balance - Comments: at times using RUE for appropriate support, however at times pushing himself beyond midline to his Left Postural control: Posterior lean;Right lateral lean;Left lateral lean                          Cognition Arousal/Alertness: Lethargic Behavior During Therapy: Flat affect Overall Cognitive Status: Difficult to assess                 General Comments: slow processing, did give thumbs up, hold up 1 finger with Rt hand; followed commands with RLE    Exercises      General Comments General comments (skin integrity, edema, etc.): RN denies sedation given since extubated 10:40 this morning.      Pertinent Vitals/Pain SaO2 100% on 4L and on room air while sitting EOB RR 17-20 HR and BP stable  Pain Assessment: Faces Faces Pain Scale: No hurt    Home Living                      Prior Function            PT Goals (current goals can  now be found in the care plan section) Acute Rehab PT Goals Patient Stated Goal: unable PT Goal Formulation: Patient unable to participate in goal setting Time For Goal Achievement: 06/30/15 Potential to Achieve Goals: Fair Progress towards PT goals: Not progressing toward goals - comment;Goals downgraded-see care plan    Frequency  Min 3X/week    PT Plan Current plan remains appropriate    Co-evaluation PT/OT/SLP Co-Evaluation/Treatment: Yes Reason for Co-Treatment: Complexity of the patient's impairments (multi-system involvement);Necessary to address cognition/behavior during functional activity;For patient/therapist safety PT goals addressed during session: Mobility/safety  with mobility;Balance;Strengthening/ROM       End of Session Equipment Utilized During Treatment: Oxygen Activity Tolerance: Patient tolerated treatment well Patient left: in bed;with call bell/phone within reach;with restraints reapplied;with SCD's reapplied     Time: 7282-0601 PT Time Calculation (min) (ACUTE ONLY): 26 min  Charges:  $Therapeutic Activity: 8-22 mins                    G Codes:      Britiny Defrain 06/17/15, 4:04 PM Pager [phone]

## 2015-06-16 NOTE — Procedures (Addendum)
Extubation Procedure Note  Patient Details:   Name: Mike Rowe DOB: 1956/07/27 MRN: [id number]   Airway Documentation:     Evaluation  O2 sats: stable throughout Complications: No apparent complications Patient did tolerate procedure well. Bilateral Breath Sounds: Other (Comment) (coarse) Suctioning: Airway Yes  Patient tolerated wean. MD ordered to extubate. Positive for cuff leak. Patient extubated to a 4 Lpm nasal cannula. Mild stridor noted upon extubation that subsided after a few minutes. MD notified. Patient resting comfortably. Patient instructed on the Incentive Spirometer achieving 236mL with very poor patient effort.   [name] 06/16/2015, 11:11 AM

## 2015-06-16 NOTE — Care Management (Signed)
UR updated.  

## 2015-06-16 NOTE — Progress Notes (Signed)
Occupational Therapy Treatment Patient Details Name: Mike Rowe MRN: [id number] DOB: [birth year] Today's Date: 06/16/2015    History of present illness Mike Rowe is an 59 y.o. male with no reported past medical history (hasn't been to doctors in years) presents with acute onset of slurred speech and left sided weakness. LSW 2130 when he noted he was unable to stand or use his left side. EMS called, upon arrival noted BP of 240/140. Code stroke activated. CT head imaging reviewed, shows right sided thalamic/BG ICH. 7/26 Transfer to the ICU for intubation and 3% saline. Extubated 8/2   OT comments  Pt with increased participation this session during co-treat with PT. Pt following 1 step commands inconsistently. Pt appears to attempt to right self in sitting, however pushes L. R gaze preference. O2 sats 98-100 on RA during session. Continue to recommend SNF for rehab. Will continue to follow acutely to maximize functional level of independence with ADL and mobility.   Follow Up Recommendations  SNF    Equipment Recommendations  None recommended by OT    Recommendations for Other Services      Precautions / Restrictions Precautions Precautions: Fall Precaution Comments: can be combative at times       Mobility Bed Mobility Overal bed mobility: +2 for physical assistance;Needs Assistance Bed Mobility: Supine to Sit;Sit to Supine Rolling: Total assist   Supine to sit: Total assist;+2 for physical assistance Sit to supine: Total assist Sit to sidelying: Max assist;+2 for physical assistance;+2 for safety/equipment General bed mobility comments: Pt did grasp rail when hand placed to rail to help with bed mobility  Transfers                 General transfer comment: not attempted     Balance Overall balance assessment: Needs assistance Sitting-balance support: Single extremity supported;No upper extremity supported;Feet supported Sitting balance-Leahy Scale: Zero Sitting  balance - Comments: at times using RUE for appropriate support, however at times pushing himself beyond midline to his Left Postural control: Posterior lean;Right lateral lean;Left lateral lean                         ADL Overall ADL's : Needs assistance/impaired     Grooming: Moderate assistance;Wash/dry face (pt washed face on command) Grooming Details (indicate cue type and reason): washed nose/mouth                               General ADL Comments: total A for ADL at this time      Vision                 Additional Comments: R gaze preference   Perception     Praxis      Cognition   Behavior During Therapy: Flat affect Overall Cognitive Status: Impaired/Different from baseline Area of Impairment: Attention;Following commands;Safety/judgement;Awareness;Problem solving   Current Attention Level: Focused    Following Commands: Follows one step commands inconsistently;Follows one step commands with increased time   Awareness: Intellectual Problem Solving: Slow processing;Decreased initiation;Difficulty sequencing;Requires verbal cues;Requires tactile cues General Comments: gave thumbs up, showed 1 finger (middle finger), kicked RLE and nodded/mouthed "yes" when asked if he was ready to get back to bed; washed face on command    Extremity/Trunk Assessment               Exercises Other Exercises Other Exercises: flexor tone LUE Other Exercises: Neck rigid  in R lateral flexion and cervical rotation   Shoulder Instructions       General Comments      Pertinent Vitals/ Pain       Pain Assessment: Faces Faces Pain Scale: No hurt  Home Living                                          Prior Functioning/Environment              Frequency Min 2X/week     Progress Toward Goals  OT Goals(current goals can now be found in the care plan section)  Progress towards OT goals: Progressing toward goals  Acute  Rehab OT Goals Patient Stated Goal: unable OT Goal Formulation: Patient unable to participate in goal setting Time For Goal Achievement: 06/22/15 Potential to Achieve Goals: Fair ADL Goals Pt Will Perform Grooming: with min assist;sitting Pt Will Transfer to Toilet: with mod assist;with +2 assist;stand pivot transfer;bedside commode Pt Will Perform Toileting - Clothing Manipulation and hygiene: with mod assist;bed level Additional ADL Goal #1: Pt will maintain sustained attention x 4 mins during simple grooming tasks   Plan Discharge plan remains appropriate    Co-evaluation    PT/OT/SLP Co-Evaluation/Treatment: Yes Reason for Co-Treatment: Complexity of the patient's impairments (multi-system involvement);Necessary to address cognition/behavior during functional activity;For patient/therapist safety PT goals addressed during session: Mobility/safety with mobility;Balance;Strengthening/ROM OT goals addressed during session: ADL's and self-care;Strengthening/ROM      End of Session     Activity Tolerance Patient tolerated treatment well   Patient Left in bed;with call bell/phone within reach;with restraints reapplied;with bed alarm set   Nurse Communication Mobility status        Time: 6270-3500 OT Time Calculation (min): 27 min  Charges: OT General Charges $OT Visit: 1 Procedure OT Treatments $Therapeutic Activity: 8-22 mins  Brandie Lopes,H[place] 06/16/2015, 4:51 PM   [facility], OTR/L  [phone] 06/16/2015

## 2015-06-16 NOTE — Progress Notes (Signed)
PULMONARY / CRITICAL CARE MEDICINE   Name: Mike Rowe MRN: [id number] DOB: 1956-10-08    ADMISSION DATE:  06/05/2015 CONSULTATION DATE:  06/09/2015  REFERRING MD :  Neurology  CHIEF COMPLAINT:  Acute respiratory failure due to inability to protect his airway.  INITIAL PRESENTATION: 59 year old male with no known PMH who presents on 7/22 with acute onset slurred speech and inability to stand.  Code stroke called, hemorrhagic thalamic stroke noted.  On 7/26 patient's CT and mental status worsened with cytotoxic edema and shift then was transferred to the ICU for inability to protect his airway.  Patient will also need hypertonic saline.  PCCM was called for respiratory failure and need for hypertonic saline.  STUDIES:  7/26 Head CT with edema and shift. 7/29- CT head evolving bleed, no change  SIGNIFICANT EVENTS: 7/26 Transfer to the ICU for intubation and 3% saline. 7/28 Hematuria  Subjective: passing SBT, following commands  VITAL SIGNS: Temp:  [96.9 F (36.1 C)-100.4 F (38 C)] 96.9 F (36.1 C) (08/02 0800) Pulse Rate:  [50-93] 71 (08/02 0900) Resp:  [13-25] 15 (08/02 0900) BP: (114-200)/(54-149) 185/145 mmHg (08/02 0900) SpO2:  [100 %] 100 % (08/02 0900) FiO2 (%):  [40 %] 40 % (08/02 0900) Weight:  [68.5 kg (151 lb 0.2 oz)] 68.5 kg (151 lb 0.2 oz) (08/02 0330)   HEMODYNAMICS:     VENTILATOR SETTINGS: Vent Mode:  [-] PSV;CPAP FiO2 (%):  [40 %] 40 % Set Rate:  [16 bmp] 16 bmp Vt Set:  [550 mL] 550 mL PEEP:  [5 cmH20] 5 cmH20 Pressure Support:  [5 cmH20-8 cmH20] 5 cmH20 Plateau Pressure:  [11 cmH20-22 cmH20] 13 cmH20   INTAKE / OUTPUT:  Intake/Output Summary (Last 24 hours) at 06/16/15 0940 Last data filed at 06/16/15 0815  Gross per 24 hour  Intake 1637.9 ml  Output   1815 ml  Net -177.1 ml   PHYSICAL EXAMINATION: General:  Chronically ill appearing on vent HENT: NCAT, ETT in place PULM: CTA B CV: RRR, no mgr GI: BS+, soft, nontender MSK: normal bulk and  tone Neuro: Awake, following commands   LABS:  CBC  Recent Labs Lab 06/12/15 0306 06/14/15 0523 06/15/15 0440  WBC 12.8* 6.9 6.1  HGB 13.0 12.4* 11.6*  HCT 39.9 37.6* 35.5*  PLT 90* 89* 90*   Coag's No results for input(s): APTT, INR in the last 168 hours. BMET  Recent Labs Lab 06/14/15 0523 06/15/15 0440 06/16/15 0345  NA 153* 153* 151*  K 3.2* 3.4* 3.3*  CL 118* 121* 119*  CO2 26 28 25   BUN 33* 33* 32*  CREATININE 0.91 0.88 0.81  GLUCOSE 150* 126* 163*   Electrolytes  Recent Labs Lab 06/10/15 0328  06/12/15 0306  06/14/15 0523 06/15/15 0440 06/16/15 0345  CALCIUM 8.3*  < > 8.2*  < > 8.2* 8.2* 8.4*  MG 2.5*  --  2.3  --  2.4  --   --   PHOS 4.4  --  3.1  --  3.7  --   --   < > = values in this interval not displayed. Sepsis Markers No results for input(s): LATICACIDVEN, PROCALCITON, O2SATVEN in the last 168 hours. ABG  Recent Labs Lab 06/09/15 1425 06/10/15 0513 06/12/15 0345  PHART 7.422 7.390 7.445  PCO2ART 28.6* 32.8* 33.9*  PO2ART 83.6 182* 137*    Liver Enzymes  Recent Labs Lab 06/11/15 0245 06/14/15 0523  AST 68* 94*  ALT 110* 100*  ALKPHOS 57 44  BILITOT 1.0 1.1  ALBUMIN 2.9* 2.4*   Cardiac Enzymes No results for input(s): TROPONINI, PROBNP in the last 168 hours. Glucose  Recent Labs Lab 06/15/15 1215 06/15/15 1648 06/15/15 1916 06/15/15 2305 06/16/15 0313 06/16/15 0825  GLUCAP 150* 134* 115* 143* 144* 128*   Imaging 7/31 CXR> ETT in place, lungs without infiltrate  ASSESSMENT / PLAN:  PULMONARY OETT 7/26>>> A: Acute respiratory failure due to inability to to protect his airway from ICH Excellent Vt on PSV. MS still will not support airway protection  P:   PSV now Extubate today, monitor carefully in ICU NPO after extubation SLP eval   CARDIOVASCULAR CVL L IJ TLC 7/26>>> A: HTN severe, post stroke P:  Target BP per neuro Continued norvasc, increase hydralazine again today, increase labetalol Careful  titrating clonidine with mental status Tele  RENAL A:  Therapeutic hypernatremia  > goal Na 150-155 Mild AKI Hypokalemia P:   Monitor BMET and UOP Replace electrolytes as needed (increase oral scheduled KCL 8/2)  GASTROINTESTINAL A:   Dysphagia Abnormal LFTs > Hepatitis C new diagnosis P:   DC Gastric tube Advance diet if passes SLP eval PPI > d/c Check HCV viral load and genotype   HEMATOLOGIC A:  Leukocytosis resolved Hematuria Thrombocytopenia stable P:  Monitor PLT CBC in AM  INFECTIOUS A:  Tracheitis vs HCAP from strep diagnosed with fever 7/28 P:   7/28 urine > 7/28 Blood >  7/28 resp > group a strep  7/28 Vanc > 7/31 7/28 Zosyn > 8/1  8/1 Ceftriaxone plan through 8/3  ENDOCRINE A:  Mild hyperglycemia P:   Monitor glucose SSI  NEUROLOGIC A:  ICH with edema, mental status improving  P:   Per neurology Extubate today, no sedation afterwards Use haldol prn agitation, not benzo or opiate   FAMILY  - Updates: no family available > have been working to try to find a family contact.  None available at this time.   Ccm time 35 min   Roselie Awkward, MD [place] Pager: [phone] Cell: [phone] After 3pm or if no response, call [phone]

## 2015-06-16 NOTE — Progress Notes (Signed)
STROKE TEAM PROGRESS NOTE   HISTORY Macintyre Alexa is an 59 y.o. male with no reported past medical history (hasn't been to doctors in years) presents with acute onset of slurred speech and left sided weakness. LSW 2130 when he noted he was unable to stand or use his left side. EMS called, upon arrival noted BP of 240/140. Code stroke activated. CT head imaging reviewed, shows right sided thalamic/BG ICH. Denies taking any blood thinners.  He denies any past medical history including hypertension. Denies any surgical history. Denies alcohol or drug abuse.  Date last known well: 06/05/2015 Time last known well: 2130 tPA Given: no, ICH Modified Rankin: 0 ICH Score: 1  Initial NIHSS of 12   SUBJECTIVE (INTERVAL HISTORY):   He was put on precedex since yesterday. His mental status much better and today he is lethargic but following commands. Plan to extubate.   Temp:  [96.9 F (36.1 C)-100.4 F (38 C)] 99.3 F (37.4 C) (08/02 1200) Pulse Rate:  [48-94] 71 (08/02 1504) Cardiac Rhythm:  [-] Normal sinus rhythm (08/02 1200) Resp:  [13-29] 16 (08/02 1504) BP: (126-213)/(55-145) 176/98 mmHg (08/02 1504) SpO2:  [100 %] 100 % (08/02 1504) FiO2 (%):  [40 %] 40 % (08/02 1000) Weight:  [151 lb 0.2 oz (68.5 kg)] 151 lb 0.2 oz (68.5 kg) (08/02 0330)   Recent Labs Lab 06/15/15 1916 06/15/15 2305 06/16/15 0313 06/16/15 0825 06/16/15 1159  GLUCAP 115* 143* 144* 128* 119*    Recent Labs Lab 06/10/15 0328  06/12/15 0306  06/13/15 0256 06/13/15 0815 06/14/15 0523 06/15/15 0440 06/16/15 0345  NA 145  < > 155*  < > 155* 153*  154* 153* 153* 151*  K 3.9  < > 3.0*  --   --  2.7* 3.2* 3.4* 3.3*  CL 118*  < > 124*  --   --  118* 118* 121* 119*  CO2 22  < > 24  --   --  27 26 28 25   GLUCOSE 147*  < > 155*  --   --  156* 150* 126* 163*  BUN 47*  < > 28*  --   --  29* 33* 33* 32*  CREATININE 1.78*  < > 1.13  --   --  1.08 0.91 0.88 0.81  CALCIUM 8.3*  < > 8.2*  --   --  8.0* 8.2* 8.2* 8.4*  MG  2.5*  --  2.3  --   --   --  2.4  --   --   PHOS 4.4  --  3.1  --   --   --  3.7  --   --   < > = values in this interval not displayed.  Recent Labs Lab 06/11/15 0245 06/14/15 0523  AST 68* 94*  ALT 110* 100*  ALKPHOS 57 44  BILITOT 1.0 1.1  PROT 6.0* 5.9*  ALBUMIN 2.9* 2.4*    Recent Labs Lab 06/10/15 0328 06/11/15 0245 06/12/15 0306 06/14/15 0523 06/15/15 0440  WBC 9.6 12.5* 12.8* 6.9 6.1  NEUTROABS  --   --   --   --  4.1  HGB 14.1 13.9 13.0 12.4* 11.6*  HCT 41.6 42.7 39.9 37.6* 35.5*  MCV 95.0 97.9 97.1 98.4 98.6  PLT 83* 83* 90* 89* 90*   No results for input(s): CKTOTAL, CKMB, CKMBINDEX, TROPONINI in the last 168 hours. No results for input(s): LABPROT, INR in the last 72 hours. No results for input(s): COLORURINE, LABSPEC, [place], [place], [place], [place],  KETONESUR, PROTEINUR, UROBILINOGEN, NITRITE, LEUKOCYTESUR in the last 72 hours.  Invalid input(s): APPERANCEUR     Component Value Date/Time   CHOL 153 06/16/2015 0345   TRIG 176* 06/16/2015 1035   HDL 16* 06/16/2015 0345   CHOLHDL 9.6 06/16/2015 0345   VLDL 40 06/16/2015 0345   LDLCALC 97 06/16/2015 0345   Lab Results  Component Value Date   HGBA1C 5.2 06/07/2015      Component Value Date/Time   LABOPIA NONE DETECTED 06/06/2015 0100   COCAINSCRNUR POSITIVE* 06/06/2015 0100   LABBENZ NONE DETECTED 06/06/2015 0100   AMPHETMU NONE DETECTED 06/06/2015 0100   THCU NONE DETECTED 06/06/2015 0100   LABBARB NONE DETECTED 06/06/2015 0100    No results for input(s): ETH in the last 168 hours.  Imaging  Ct Head Wo Contrast 06/05/2015    Acute right thalamic hemorrhage.    Ct Head Wo Contrast - ( Pre Fall ) 06/06/2015    Increase in size of right thalamic/posterior right internal capsule hematoma now with maximal transverse dimension 2.3 x 2.2 cm versus prior 2.1 x 1.6 cm.  Increase surrounding vasogenic edema and local mass effect with mild compression the right lateral ventricle without evidence  of midline shift. Interval development of blood within the deep dependent aspect of the right lateral ventricle consistent with breakthrough of the right thalamic hemorrhage into the right lateral ventricle.  Ct Head Wo Contrast - ( Post fall ) 06/06/2015 Right for thalamic hemorrhage with a small amount of intraventricular blood appears unchanged. No new abnormality is seen.  06/15/2015 Essentially no change compared to recent prior study. Hemorrhagic lesion measuring 4.5 x 3.6 arising from the right basal ganglia with surrounding edema, stable. Midline shift of the third ventricle toward the left is stable. Slight ventricular prominence with layering hemorrhage in the occipital horns of the lateral ventricles remain without appreciable change. No change in overall ventricular size and configuration. No new infarct compared to recent prior study.  PHYSICAL EXAM   Temp:  [96.9 F (36.1 C)-100.4 F (38 C)] 99.3 F (37.4 C) (08/02 1200) Pulse Rate:  [48-94] 71 (08/02 1504) Resp:  [13-29] 16 (08/02 1504) BP: (126-213)/(55-145) 176/98 mmHg (08/02 1504) SpO2:  [100 %] 100 % (08/02 1504) FiO2 (%):  [40 %] 40 % (08/02 1000) Weight:  [151 lb 0.2 oz (68.5 kg)] 151 lb 0.2 oz (68.5 kg) (08/02 0330)  General - Well nourished, well developed, intubated and not following commands.  Ophthalmologic - Fundi not visualized due to noncooperation.  Cardiovascular - Regular rate and rhythm.  Neuro - intubated and on precedex. Pt opens eyes on voice and follows some simple commands, such as wiggle toes and showing fingers. PERRL, right sided gaze preference, doll's eyes present, positive corneal and gag and cough, breathing over the vent. On pain stimulation, left UE and LE withdraw to pain, right UE and LE spontaneous movement. LUE increased muscle tone. Babinski positive on the left. DTR decreased on the left. Sensation, coordination and gait not tested.   ASSESSMENT Mr. Xayvion Shirah is a 59 y.o. male  with no significant past medical history no regular medical follow-up presenting with left hemiparesis really elevated blood pressures. He did not receive IV t-PA due to acute right thalamic hemorrhage.   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 Hematoma had interval enlargement due to agitation and high BP not at goal.   Resultant  left hemiparesis, less responsive  MRI  not performed  MRA  not performed  Carotid Doppler  pending  2D Echo - unremarkable  LDL 97  HgbA1c 5.2  SCDs for VTE prophylaxis Diet NPO time specified  no antithrombotic prior to admission, now on no antithrombotic secondary to hemorrhage  Ongoing aggressive stroke risk factor management  Therapy recommendations: Pending   Disposition:  Pending  Cerebral edema  Off 3% saline now  Na 153->151  Close monitoring  On precedex  Cocaine abuse  Positive on UDS  Hypertension  Home meds:  No antihypertensives medications prior to admission  Unstable, still high  On norvasc, clonidine, hydralazine, labetalol  Increase clonidine to 0.2mg  tid  Hyperlipidemia  LDL 97  No home meds  Hold off statins due to elevated liver enzymes.  Other Stroke Risk Factors  Cigarette smoking  alcohol use  Other Active Problems  Thrombocytopenia  Anxiety / agitation  Mildly elevated BUN   Other Pertinent History  No family contact yet  Hospital day # 10  This patient is critically ill due to right ICH and respiratory failue and at significant risk of neurological worsening, death form rebleeding and seizure and cerebral edema. This patient's care requires constant monitoring of vital signs, hemodynamics, respiratory and cardiac monitoring, review of multiple databases, neurological assessment, discussion with family, other specialists and medical decision making of high complexity. I spent 35 minutes of neurocritical care time in the care of this  patient.  Rosalin Hawking, MD PhD Stroke Neurology 06/16/2015 3:48 PM    To contact Stroke Continuity provider, please refer to [email or web address] After hours, contact General Neurology

## 2015-06-17 ENCOUNTER — Inpatient Hospital Stay (HOSPITAL_COMMUNITY): Payer: Medicaid Other

## 2015-06-17 DIAGNOSIS — I10 Essential (primary) hypertension: Secondary | ICD-10-CM | POA: Insufficient documentation

## 2015-06-17 DIAGNOSIS — F1410 Cocaine abuse, uncomplicated: Secondary | ICD-10-CM

## 2015-06-17 DIAGNOSIS — J40 Bronchitis, not specified as acute or chronic: Secondary | ICD-10-CM

## 2015-06-17 DIAGNOSIS — I619 Nontraumatic intracerebral hemorrhage, unspecified: Secondary | ICD-10-CM

## 2015-06-17 DIAGNOSIS — R1310 Dysphagia, unspecified: Secondary | ICD-10-CM

## 2015-06-17 DIAGNOSIS — R748 Abnormal levels of other serum enzymes: Secondary | ICD-10-CM

## 2015-06-17 DIAGNOSIS — Z720 Tobacco use: Secondary | ICD-10-CM

## 2015-06-17 DIAGNOSIS — I639 Cerebral infarction, unspecified: Secondary | ICD-10-CM

## 2015-06-17 LAB — CBC WITH DIFFERENTIAL/PLATELET
Basophils Absolute: 0 10*3/uL (ref 0.0–0.1)
Basophils Relative: 0 % (ref 0–1)
EOS ABS: 0.1 10*3/uL (ref 0.0–0.7)
EOS PCT: 1 % (ref 0–5)
HEMATOCRIT: 40.9 % (ref 39.0–52.0)
Hemoglobin: 13.4 g/dL (ref 13.0–17.0)
Lymphocytes Relative: 13 % (ref 12–46)
Lymphs Abs: 1.4 10*3/uL (ref 0.7–4.0)
MCH: 31.5 pg (ref 26.0–34.0)
MCHC: 32.8 g/dL (ref 30.0–36.0)
MCV: 96.2 fL (ref 78.0–100.0)
MONO ABS: 1.1 10*3/uL — AB (ref 0.1–1.0)
MONOS PCT: 10 % (ref 3–12)
NEUTROS ABS: 8.4 10*3/uL — AB (ref 1.7–7.7)
NEUTROS PCT: 76 % (ref 43–77)
Platelets: 115 10*3/uL — ABNORMAL LOW (ref 150–400)
RBC: 4.25 MIL/uL (ref 4.22–5.81)
RDW: 13.6 % (ref 11.5–15.5)
WBC: 11 10*3/uL — ABNORMAL HIGH (ref 4.0–10.5)

## 2015-06-17 LAB — BASIC METABOLIC PANEL
Anion gap: 9 (ref 5–15)
BUN: 27 mg/dL — ABNORMAL HIGH (ref 6–20)
CALCIUM: 8.5 mg/dL — AB (ref 8.9–10.3)
CO2: 23 mmol/L (ref 22–32)
Chloride: 121 mmol/L — ABNORMAL HIGH (ref 101–111)
Creatinine, Ser: 0.76 mg/dL (ref 0.61–1.24)
GFR calc non Af Amer: >60 mL/min (ref >60)
Glucose, Bld: 114 mg/dL — ABNORMAL HIGH (ref 65–99)
Potassium: 3.5 mmol/L (ref 3.5–5.1)
Sodium: 153 mmol/L — ABNORMAL HIGH (ref 135–145)

## 2015-06-17 LAB — GLUCOSE, CAPILLARY
GLUCOSE-CAPILLARY: 100 mg/dL — AB (ref 65–99)
GLUCOSE-CAPILLARY: 106 mg/dL — AB (ref 65–99)
GLUCOSE-CAPILLARY: 109 mg/dL — AB (ref 65–99)
GLUCOSE-CAPILLARY: 110 mg/dL — AB (ref 65–99)
Glucose-Capillary: 111 mg/dL — ABNORMAL HIGH (ref 65–99)

## 2015-06-17 MED ORDER — POTASSIUM CHLORIDE 10 MEQ/50ML IV SOLN
10.0000 meq | INTRAVENOUS | Status: AC
  Administered 2015-06-17 (×2): 10 meq via INTRAVENOUS
  Filled 2015-06-17 (×2): qty 50

## 2015-06-17 MED ORDER — POTASSIUM CHLORIDE 20 MEQ/15ML (10%) PO SOLN
40.0000 meq | Freq: Once | ORAL | Status: AC
  Administered 2015-06-17: 40 meq
  Filled 2015-06-17: qty 30

## 2015-06-17 MED ORDER — VITAL HIGH PROTEIN PO LIQD
1000.0000 mL | ORAL | Status: DC
  Administered 2015-06-17: 1000 mL
  Administered 2015-06-17 (×2)
  Administered 2015-06-18: 1000 mL
  Filled 2015-06-17 (×3): qty 1000

## 2015-06-17 NOTE — Clinical Social Work Note (Signed)
Clinical Social Worker continuing to follow patient for support and discharge planning needs.  CSW unable to locate next of kin and have not yet received return call from patient previous employer.  Patient previous employer states that patient does have a son who he believes lives in [place], however no available contact information.  CSW to begin SNF search and will have CSW supervisor notified regarding possible guardianship and need to sign patient into facility.  CSW remains available for support and to facilitate patient discharge needs once medically stable.  Barbette Or, [place]

## 2015-06-17 NOTE — Progress Notes (Signed)
STROKE TEAM PROGRESS NOTE   HISTORY Mike Rowe is an 59 y.o. male with no reported past medical history (hasn't been to doctors in years) presents with acute onset of slurred speech and left sided weakness. LSW 2130 when he noted he was unable to stand or use his left side. EMS called, upon arrival noted BP of 240/140. Code stroke activated. CT head imaging reviewed, shows right sided thalamic/BG ICH. Denies taking any blood thinners.  He denies any past medical history including hypertension. Denies any surgical history. Denies alcohol or drug abuse.  Date last known well: 06/05/2015 Time last known well: 2130 tPA Given: no, ICH Modified Rankin: 0 ICH Score: 1  Initial NIHSS of 12   SUBJECTIVE (INTERVAL HISTORY):   He is off precedex and has sitter at bedside. He has been extubated. His mental status continues to improve and today he follows commands, but still lethargic.   Temp:  [98.3 F (36.8 C)-100 F (37.8 C)] 99.7 F (37.6 C) (08/03 1621) Pulse Rate:  [55-110] 80 (08/03 1700) Cardiac Rhythm:  [-] Sinus tachycardia (08/03 1200) Resp:  [15-32] 20 (08/03 1700) BP: (117-211)/(61-157) 148/83 mmHg (08/03 1700) SpO2:  [100 %] 100 % (08/03 1700)   Recent Labs Lab 06/16/15 2322 06/17/15 0301 06/17/15 0748 06/17/15 1128 06/17/15 1620  GLUCAP 107* 111* 100* 106* 109*    Recent Labs Lab 06/12/15 0306  06/13/15 0815 06/14/15 0523 06/15/15 0440 06/16/15 0345 06/17/15 0350  NA 155*  < > 153*  154* 153* 153* 151* 153*  K 3.0*  --  2.7* 3.2* 3.4* 3.3* 3.5  CL 124*  --  118* 118* 121* 119* 121*  CO2 24  --  27 26 28 25 23   GLUCOSE 155*  --  156* 150* 126* 163* 114*  BUN 28*  --  29* 33* 33* 32* 27*  CREATININE 1.13  --  1.08 0.91 0.88 0.81 0.76  CALCIUM 8.2*  --  8.0* 8.2* 8.2* 8.4* 8.5*  MG 2.3  --   --  2.4  --   --   --   PHOS 3.1  --   --  3.7  --   --   --   < > = values in this interval not displayed.  Recent Labs Lab 06/11/15 0245 06/14/15 0523  AST 68* 94*   ALT 110* 100*  ALKPHOS 57 44  BILITOT 1.0 1.1  PROT 6.0* 5.9*  ALBUMIN 2.9* 2.4*    Recent Labs Lab 06/11/15 0245 06/12/15 0306 06/14/15 0523 06/15/15 0440 06/17/15 0350  WBC 12.5* 12.8* 6.9 6.1 11.0*  NEUTROABS  --   --   --  4.1 8.4*  HGB 13.9 13.0 12.4* 11.6* 13.4  HCT 42.7 39.9 37.6* 35.5* 40.9  MCV 97.9 97.1 98.4 98.6 96.2  PLT 83* 90* 89* 90* 115*   No results for input(s): CKTOTAL, CKMB, CKMBINDEX, TROPONINI in the last 168 hours. No results for input(s): LABPROT, INR in the last 72 hours. No results for input(s): COLORURINE, LABSPEC, [place], GLUCOSEU, HGBUR, BILIRUBINUR, KETONESUR, PROTEINUR, UROBILINOGEN, NITRITE, LEUKOCYTESUR in the last 72 hours.  Invalid input(s): APPERANCEUR     Component Value Date/Time   CHOL 153 06/16/2015 0345   TRIG 176* 06/16/2015 1035   HDL 16* 06/16/2015 0345   CHOLHDL 9.6 06/16/2015 0345   VLDL 40 06/16/2015 0345   LDLCALC 97 06/16/2015 0345   Lab Results  Component Value Date   HGBA1C 5.2 06/07/2015      Component Value Date/Time   LABOPIA  NONE DETECTED 06/06/2015 0100   COCAINSCRNUR POSITIVE* 06/06/2015 0100   LABBENZ NONE DETECTED 06/06/2015 0100   AMPHETMU NONE DETECTED 06/06/2015 0100   THCU NONE DETECTED 06/06/2015 0100   LABBARB NONE DETECTED 06/06/2015 0100    No results for input(s): ETH in the last 168 hours.  Imaging  Ct Head Wo Contrast 06/05/2015    Acute right thalamic hemorrhage.    Ct Head Wo Contrast - ( Pre Fall ) 06/06/2015    Increase in size of right thalamic/posterior right internal capsule hematoma now with maximal transverse dimension 2.3 x 2.2 cm versus prior 2.1 x 1.6 cm.  Increase surrounding vasogenic edema and local mass effect with mild compression the right lateral ventricle without evidence of midline shift. Interval development of blood within the deep dependent aspect of the right lateral ventricle consistent with breakthrough of the right thalamic hemorrhage into the right lateral  ventricle.  Ct Head Wo Contrast - ( Post fall ) 06/06/2015 Right for thalamic hemorrhage with a small amount of intraventricular blood appears unchanged. No new abnormality is seen.  06/15/2015 Essentially no change compared to recent prior study. Hemorrhagic lesion measuring 4.5 x 3.6 arising from the right basal ganglia with surrounding edema, stable. Midline shift of the third ventricle toward the left is stable. Slight ventricular prominence with layering hemorrhage in the occipital horns of the lateral ventricles remain without appreciable change. No change in overall ventricular size and configuration. No new infarct compared to recent prior Study.  CUS - Technically limited due to left IJ line, pt position, and movement. Visualized bilaterally the bifurcation, ICA, and ECA. Appears to be 1-39% ICA stenosis.  2D echo - - Left ventricle: The cavity size was normal. Wall thickness was increased in a pattern of mild LVH. Systolic function was normal. The estimated ejection fraction was in the range of 60% to 65%. Wall motion was normal; there were no regional wall motion abnormalities. Doppler parameters are consistent with abnormal left ventricular relaxation (grade 1 diastolic dysfunction).  Impressions: - No cardiac source of emboli was indentified.  PHYSICAL EXAM   Temp:  [98.3 F (36.8 C)-100 F (37.8 C)] 99.7 F (37.6 C) (08/03 1621) Pulse Rate:  [55-110] 80 (08/03 1700) Resp:  [15-32] 20 (08/03 1700) BP: (117-211)/(61-157) 148/83 mmHg (08/03 1700) SpO2:  [100 %] 100 % (08/03 1700)  General - Well nourished, well developed, intubated and not following commands.  Ophthalmologic - Fundi not visualized due to noncooperation.  Cardiovascular - Regular rate and rhythm.  Neuro - extubated on nasal cannula. Pt opens eyes on voice and follows some simple commands, such as wiggle toes and showing fingers. PERRL, right sided gaze preference, doll's eyes present,  positive corneal and gag and cough, breathing over the vent. On pain stimulation, left UE and LE withdraw to pain, right UE and LE spontaneous movement. LUE increased muscle tone. Babinski positive on the left. DTR decreased on the left. Sensation, coordination and gait not tested.   ASSESSMENT [name] is a 59 y.o. male with no significant past medical history no regular medical follow-up presenting with left hemiparesis really elevated blood pressures. He did not receive IV t-PA due to acute right thalamic hemorrhage.   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 Hematoma had interval enlargement due to agitation and high BP not at goal.   Resultant  left hemiparesis, less responsive  MRI  not performed  MRA  not performed   Carotid Doppler  technically difficult,  but seems unremarkable.  2D Echo - unremarkable  LDL 97  HgbA1c 5.2  SCDs for VTE prophylaxis Diet NPO time specified  no antithrombotic prior to admission, now on no antithrombotic secondary to hemorrhage  Ongoing aggressive stroke risk factor management  Therapy recommendations: Pending   Disposition:  Pending  Cerebral edema  Off 3% saline now  Na 153->151->153  Close monitoring  Off precedex  Cocaine abuse  Positive on UDS  Hypertension  Home meds:  No antihypertensives medications prior to admission  Unstable, still high  On norvasc, clonidine, hydralazine, labetalol  Increase clonidine to 0.2mg  tid  Hyperlipidemia  LDL 97  No home meds  Hold off statins due to elevated liver enzymes.  Other Stroke Risk Factors  Cigarette smoking  alcohol use  Other Active Problems  Thrombocytopenia  Anxiety / agitation  Mildly elevated BUN   Other Pertinent History  No family contact yet  Hospital day # 10  This patient is critically ill due to right ICH and respiratory failue and at significant risk of neurological worsening, death form  rebleeding and seizure and cerebral edema. This patient's care requires constant monitoring of vital signs, hemodynamics, respiratory and cardiac monitoring, review of multiple databases, neurological assessment, discussion with family, other specialists and medical decision making of high complexity. I spent 35 minutes of neurocritical care time in the care of this patient.  Rosalin Hawking, MD PhD Stroke Neurology 06/17/2015 6:27 PM    To contact Stroke Continuity provider, please refer to [email or web address] After hours, contact General Neurology

## 2015-06-17 NOTE — Progress Notes (Signed)
*  PRELIMINARY RESULTS* Vascular Ultrasound Carotid Duplex (Doppler) has been completed.  Preliminary findings:  Technically limited due to left IJ line, pt position, and movement.  Visualized bilaterally the bifurcation, ICA, and ECA. Appears to be 1-39% ICA stenosis.   Landry Mellow, RDMS, RVT  06/17/2015, 9:50 AM

## 2015-06-17 NOTE — Evaluation (Signed)
Clinical/Bedside Swallow Evaluation Patient Details  Name: Mike Rowe MRN: [id number] Date of Birth: [birth year]  Today's Date: 06/17/2015 Time: SLP Start Time (ACUTE ONLY): 1002 SLP Stop Time (ACUTE ONLY): 1016 SLP Time Calculation (min) (ACUTE ONLY): 14 min  Past Medical History: History reviewed. No pertinent past medical history. Past Surgical History: History reviewed. No pertinent past surgical history. HPI:  Mike Rowe is an 59 y.o. male with no reported past medical history (hasn't been to doctors in years) presents with acute onset of slurred speech and left sided weakness. LSW 2130 when he noted he was unable to stand or use his left side. EMS called, upon arrival noted BP of 240/140. Code stroke activated. CT head imaging reviewed, shows right sided thalamic/BG ICH. Denies taking any blood thinners. Pt was intubated 7/26-8/2.   Assessment / Plan / Recommendation Clinical Impression  Pt is drowsy with sluggish oral acceptance and bolus manipulation. Posterior transit is delayed, and there is oral residue that remains with pureed textures. Pt requires Mod cues for additional swallows, but still needs small amounts of residue suctioned from oral cavity. Delayed coughing is noted with thin liquids. Given pt's h/o dysphagia prior to prolonged intubation, objective testing is warranted prior to diet initiation. Will f/u at bedside on next date to assess for improved alertness and readiness for MBS.`    Aspiration Risk  Moderate    Diet Recommendation NPO   Medication Administration: Via alternative means    Other  Recommendations Oral Care Recommendations: Oral care QID   Follow Up Recommendations   SNF    Frequency and Duration min 2x/week  1 week   Pertinent Vitals/Pain n/a    SLP Swallow Goals     Swallow Study Prior Functional Status       General Other Pertinent Information: Mike Rowe is an 59 y.o. male with no reported past medical history (hasn't been to doctors in  years) presents with acute onset of slurred speech and left sided weakness. LSW 2130 when he noted he was unable to stand or use his left side. EMS called, upon arrival noted BP of 240/140. Code stroke activated. CT head imaging reviewed, shows right sided thalamic/BG ICH. Denies taking any blood thinners. Pt was intubated 7/26-8/2. Type of Study: Bedside swallow evaluation Previous Swallow Assessment: MBS 7/23 with silent aspiration of thin liquids Diet Prior to this Study: NPO Temperature Spikes Noted: Yes (99.3) Respiratory Status: Room air History of Recent Intubation: Yes Length of Intubations (days): 7 days Date extubated: 06/16/15 Behavior/Cognition: Lethargic/Drowsy;Requires cueing Oral Cavity - Dentition: Poor condition;Missing dentition Self-Feeding Abilities: Needs assist Patient Positioning: Upright in bed Baseline Vocal Quality: Low vocal intensity    Oral/Motor/Sensory Function     Ice Chips Ice chips: Impaired Presentation: Spoon Oral Phase Impairments: Reduced labial seal;Reduced lingual movement/coordination Oral Phase Functional Implications: Prolonged oral transit Pharyngeal Phase Impairments: Suspected delayed Swallow   Thin Liquid Thin Liquid: Impaired Presentation: Spoon;Cup Oral Phase Impairments: Reduced lingual movement/coordination;Reduced labial seal Oral Phase Functional Implications: Left anterior spillage;Prolonged oral transit Pharyngeal  Phase Impairments: Cough - Delayed    Nectar Thick Nectar Thick Liquid: Not tested   Honey Thick Honey Thick Liquid: Not tested   Puree Puree: Impaired Presentation: Spoon Oral Phase Impairments: Reduced lingual movement/coordination;Reduced labial seal;Poor awareness of bolus Oral Phase Functional Implications: Prolonged oral transit;Oral residue;Oral holding   Solid    Solid: Not tested      Germain Osgood, M.A. CCC-SLP [phone]  Germain Osgood 06/17/2015,11:14 AM

## 2015-06-17 NOTE — Progress Notes (Signed)
PULMONARY / CRITICAL CARE MEDICINE   Name: Mike Rowe MRN: [id number] DOB: November 26, 1955    ADMISSION DATE:  06/05/2015 CONSULTATION DATE:  06/09/2015  REFERRING MD :  Neurology  CHIEF COMPLAINT:  Acute respiratory failure due to inability to protect his airway.  INITIAL PRESENTATION: 59 year old male with no known PMH who presents on 7/22 with acute onset slurred speech and inability to stand.  Code stroke called, hemorrhagic R thalamic stroke noted.  On 7/26 patient's CT and mental status worsened with cytotoxic edema and shift then was transferred to the ICU for inability to protect his airway.  Patient will also need hypertonic saline.  PCCM was called for respiratory failure and need for hypertonic saline.  STUDIES:  7/26 Head CT with edema and shift. 7/29- CT head evolving bleed, no change  SIGNIFICANT EVENTS: 7/26 Transfer to the ICU for intubation and 3% saline. 7/28 Hematuria 8/2 extubated  Subjective: Extubated yesterday, SLP eval requests modified barium swallow  VITAL SIGNS: Temp:  [98 F (36.7 C)-100 F (37.8 C)] 100 F (37.8 C) (08/03 0748) Pulse Rate:  [48-110] 69 (08/03 0900) Resp:  [16-30] 20 (08/03 0900) BP: (117-213)/(56-157) 117/104 mmHg (08/03 0900) SpO2:  [100 %] 100 % (08/03 0900)   HEMODYNAMICS:     VENTILATOR SETTINGS:     INTAKE / OUTPUT:  Intake/Output Summary (Last 24 hours) at 06/17/15 1033 Last data filed at 06/17/15 1002  Gross per 24 hour  Intake    750 ml  Output   1701 ml  Net   -951 ml   PHYSICAL EXAMINATION: General:  Chronically ill appearing; awake HENT: NCAT, ETT in place PULM: CTA B CV: RRR, no mgr GI: BS+, soft, nontender MSK: normal bulk and tone Neuro: Awake, follows simple commands, non-verbal for me; no movement L  LABS:  CBC  Recent Labs Lab 06/14/15 0523 06/15/15 0440 06/17/15 0350  WBC 6.9 6.1 11.0*  HGB 12.4* 11.6* 13.4  HCT 37.6* 35.5* 40.9  PLT 89* 90* 115*   Coag's No results for input(s): APTT,  INR in the last 168 hours. BMET  Recent Labs Lab 06/15/15 0440 06/16/15 0345 06/17/15 0350  NA 153* 151* 153*  K 3.4* 3.3* 3.5  CL 121* 119* 121*  CO2 28 25 23   BUN 33* 32* 27*  CREATININE 0.88 0.81 0.76  GLUCOSE 126* 163* 114*   Electrolytes  Recent Labs Lab 06/12/15 0306  06/14/15 0523 06/15/15 0440 06/16/15 0345 06/17/15 0350  CALCIUM 8.2*  < > 8.2* 8.2* 8.4* 8.5*  MG 2.3  --  2.4  --   --   --   PHOS 3.1  --  3.7  --   --   --   < > = values in this interval not displayed. Sepsis Markers No results for input(s): LATICACIDVEN, PROCALCITON, O2SATVEN in the last 168 hours. ABG  Recent Labs Lab 06/12/15 0345  PHART 7.445  PCO2ART 33.9*  PO2ART 137*    Liver Enzymes  Recent Labs Lab 06/11/15 0245 06/14/15 0523  AST 68* 94*  ALT 110* 100*  ALKPHOS 57 44  BILITOT 1.0 1.1  ALBUMIN 2.9* 2.4*   Cardiac Enzymes No results for input(s): TROPONINI, PROBNP in the last 168 hours. Glucose  Recent Labs Lab 06/16/15 1159 06/16/15 1612 06/16/15 1925 06/16/15 2322 06/17/15 0301 06/17/15 0748  GLUCAP 119* 130* 100* 107* 111* 100*   Imaging 7/31 CXR> ETT in place, lungs without infiltrate  ASSESSMENT / PLAN:  PULMONARY OETT 7/26>>> A: Acute respiratory failure  due to inability to to protect his airway from [place] Extubated 8/3, maintaining respiratory status but high aspiration risk P:   Aspiration precautions SLP eval > they recommend MBS tomorrow   CARDIOVASCULAR CVL L IJ TLC 7/26>>> A: HTN severe, post stroke > improved P:  Target BP per neuro Continued norvasc, continue hydralazine, continue labetalol Careful titrating clonidine with mental status Tele  RENAL A:  Therapeutic hypernatremia  > completed Mild AKI > resolved Hypokalemia P:   Monitor BMET and UOP Scheduled KCL  GASTROINTESTINAL A:   Dysphagia Abnormal LFTs > Hepatitis C new diagnosis P:   SLP eval Place panda > tube feeds, meds Check HCV viral load and  genotype   HEMATOLOGIC A:  Leukocytosis resolved Hematuria Thrombocytopenia stable P:  Monitor PLT CBC in AM  INFECTIOUS A:  Tracheitis vs HCAP from strep diagnosed with fever 7/28  P:   7/28 urine > 7/28 Blood >  7/28 resp > group a strep  7/28 Vanc > 7/31 7/28 Zosyn > 8/1  8/1 Ceftriaxone plan through 8/3  ENDOCRINE A:  Mild hyperglycemia P:   Monitor glucose SSI  NEUROLOGIC A:  ICH with edema, mental status improving  P:   Per neurology Use haldol prn agitation, not benzo or opiate   FAMILY  - Updates: no family available > have been working to try to find a family contact.  None available at this time.    Roselie Awkward, MD [place] Pager: [phone] Cell: [phone] After 3pm or if no response, call [phone]

## 2015-06-17 NOTE — Progress Notes (Signed)
Pt found by RN to have pulled out panda tube all but about 6-8 cm with 3pm sitter at bedside. Pt placed in R wrist restraint. Panda replaced. No respiratory difficulties at this time, sats 100% on RA, HR 88, RR 19. MD made aware of situation and KUB ordered. Will continue to monitor.

## 2015-06-17 NOTE — Progress Notes (Signed)
[facility] ADULT ICU REPLACEMENT PROTOCOL FOR AM LAB REPLACEMENT ONLY  The patient does apply for the [facility] Adult ICU Electrolyte Replacment Protocol based on the criteria listed below:   1. Is GFR >/= 40 ml/min? Yes.    Patient's GFR today is >60 2. Is urine output >/= 0.5 ml/kg/hr for the last 6 hours? Yes.   Patient's UOP is 1.1 ml/kg/hr 3. Is BUN < 60 mg/dL? Yes.    Patient's BUN today is 27 4. Abnormal electrolyte(s): K3.5 5. Ordered repletion with: 26meq/IV 6. If a panic level lab has been reported, has the CCM MD in charge been notified? Yes.  .   Physician:  Janalyn Harder 06/17/2015 6:33 AM

## 2015-06-18 ENCOUNTER — Inpatient Hospital Stay (HOSPITAL_COMMUNITY): Payer: Medicaid Other

## 2015-06-18 DIAGNOSIS — Z87898 Personal history of other specified conditions: Secondary | ICD-10-CM

## 2015-06-18 DIAGNOSIS — Z4659 Encounter for fitting and adjustment of other gastrointestinal appliance and device: Secondary | ICD-10-CM | POA: Insufficient documentation

## 2015-06-18 LAB — BASIC METABOLIC PANEL
Anion gap: 9 (ref 5–15)
BUN: 44 mg/dL — ABNORMAL HIGH (ref 6–20)
CHLORIDE: 119 mmol/L — AB (ref 101–111)
CO2: 22 mmol/L (ref 22–32)
Calcium: 8.6 mg/dL — ABNORMAL LOW (ref 8.9–10.3)
Creatinine, Ser: 0.94 mg/dL (ref 0.61–1.24)
GLUCOSE: 132 mg/dL — AB (ref 65–99)
POTASSIUM: 3.3 mmol/L — AB (ref 3.5–5.1)
Sodium: 150 mmol/L — ABNORMAL HIGH (ref 135–145)

## 2015-06-18 LAB — GLUCOSE, CAPILLARY
GLUCOSE-CAPILLARY: 124 mg/dL — AB (ref 65–99)
Glucose-Capillary: 137 mg/dL — ABNORMAL HIGH (ref 65–99)
Glucose-Capillary: 132 mg/dL — ABNORMAL HIGH (ref 65–99)
Glucose-Capillary: 88 mg/dL (ref 65–99)
Glucose-Capillary: 121 mg/dL — ABNORMAL HIGH (ref 65–99)
Glucose-Capillary: 113 mg/dL — ABNORMAL HIGH (ref 65–99)

## 2015-06-18 LAB — HCV RNA QUANT RFLX ULTRA OR GENOTYP
HCV RNA Qnt(log copy/mL): 6.563 log10 IU/mL
HEPATITIS C QUANTITATION: 3657800 [IU]/mL

## 2015-06-18 LAB — HEPATITIS C GENOTYPE

## 2015-06-18 MED ORDER — LABETALOL HCL 5 MG/ML IV SOLN: 10.0000 mg | INTRAVENOUS | Status: DC | PRN

## 2015-06-18 MED ORDER — HEPARIN SODIUM (PORCINE) 5000 UNIT/ML IJ SOLN
5000.0000 [IU] | Freq: Three times a day (TID) | INTRAMUSCULAR | Status: AC
  Administered 2015-06-18 – 2015-06-24 (×21): 5000 [IU] via SUBCUTANEOUS
  Filled 2015-06-18 (×20): qty 1

## 2015-06-18 MED ORDER — JEVITY 1.5 CAL/FIBER PO LIQD
1000.0000 mL | ORAL | Status: DC
  Administered 2015-06-18 – 2015-06-23 (×6): 1000 mL
  Filled 2015-06-18 (×11): qty 1000

## 2015-06-18 MED ORDER — POTASSIUM CHLORIDE 20 MEQ/15ML (10%) PO SOLN
20.0000 meq | ORAL | Status: AC
  Administered 2015-06-18 (×2): 20 meq
  Filled 2015-06-18 (×2): qty 15

## 2015-06-18 MED ORDER — PRO-STAT SUGAR FREE PO LIQD
30.0000 mL | Freq: Every day | ORAL | Status: DC
  Administered 2015-06-18 – 2015-06-27 (×10): 30 mL
  Filled 2015-06-18 (×10): qty 30

## 2015-06-18 NOTE — Progress Notes (Addendum)
PULMONARY / CRITICAL CARE MEDICINE   Name: Mike Rowe MRN: [id number] DOB: 11-21-1955    ADMISSION DATE:  06/05/2015 CONSULTATION DATE:  06/09/2015  REFERRING MD :  Neurology  CHIEF COMPLAINT:  Acute respiratory failure due to inability to protect his airway.  INITIAL PRESENTATION:  59 yo male with slurred speech and unsteady gait from Rt hemorrhagic thalamic stroke.  Developed cytotoxic edema, and inability to protect airway.    STUDIES:  7/25 Echo >> EF 60 to 65% 7/26 Head CT with edema and shift. 7/29 CT head evolving bleed, no change  SIGNIFICANT EVENTS: 7/26 Transfer to the ICU for intubation and 3% saline. 7/28 Hematuria 8/02 extubated  SUBJECTIVE:  Having difficulty with secretions.  VITAL SIGNS: Temp:  [97.5 F (36.4 C)-99.7 F (37.6 C)] 98.9 F (37.2 C) (08/04 0758) Pulse Rate:  [56-107] 79 (08/04 0800) Resp:  [15-32] 18 (08/04 0800) BP: (103-201)/(57-104) 175/96 mmHg (08/04 0800) SpO2:  [100 %] 100 % (08/04 0800) Weight:  [142 lb 6.7 oz (64.6 kg)] 142 lb 6.7 oz (64.6 kg) (08/04 0453)   INTAKE / OUTPUT:  Intake/Output Summary (Last 24 hours) at 06/18/15 0937 Last data filed at 06/18/15 0640  Gross per 24 hour  Intake    788 ml  Output   1100 ml  Net   -312 ml   PHYSICAL EXAMINATION: General: ill appearing HENT: NG tube in place PULM: b/l crackles CV: regular, tachycardic GI: soft, non tender MSK: no edema Neuro: Non verbal, no movement on Lt side  LABS:  CBC  Recent Labs Lab 06/14/15 0523 06/15/15 0440 06/17/15 0350  WBC 6.9 6.1 11.0*  HGB 12.4* 11.6* 13.4  HCT 37.6* 35.5* 40.9  PLT 89* 90* 115*   BMET  Recent Labs Lab 06/16/15 0345 06/17/15 0350 06/18/15 0508  NA 151* 153* 150*  K 3.3* 3.5 3.3*  CL 119* 121* 119*  CO2 25 23 22   BUN 32* 27* 44*  CREATININE 0.81 0.76 0.94  GLUCOSE 163* 114* 132*   Electrolytes  Recent Labs Lab 06/12/15 0306  06/14/15 0523  06/16/15 0345 06/17/15 0350 06/18/15 0508  CALCIUM 8.2*  < >  8.2*  < > 8.4* 8.5* 8.6*  MG 2.3  --  2.4  --   --   --   --   PHOS 3.1  --  3.7  --   --   --   --   < > = values in this interval not displayed.  ABG  Recent Labs Lab 06/12/15 0345  PHART 7.445  PCO2ART 33.9*  PO2ART 137*    Liver Enzymes  Recent Labs Lab 06/14/15 0523  AST 94*  ALT 100*  ALKPHOS 44  BILITOT 1.1  ALBUMIN 2.4*   Glucose  Recent Labs Lab 06/17/15 1128 06/17/15 1620 06/17/15 1924 06/17/15 2324 06/18/15 0318 06/18/15 0757  GLUCAP 106* 109* 110* 137* 132* 88   Imaging Dg Abd Portable 1v  06/18/2015   CLINICAL DATA:  Feeding tube replacement.  EXAM: PORTABLE ABDOMEN - 1 VIEW  COMPARISON:  One-view abdomen 06/17/2015.  FINDINGS: A single view the abdomen demonstrates the tip the small bore feeding tube in the distal stomach. The bowel gas pattern is otherwise unremarkable. The lung bases are clear.  IMPRESSION: The tip of a small bore feeding tube is in the distal stomach.   Electronically Signed   By: San Morelle M.D.   On: 06/18/2015 07:21   Dg Abd Portable 1v  06/17/2015   CLINICAL DATA:  Enteric tube  placement.  EXAM: PORTABLE ABDOMEN - 1 VIEW  COMPARISON:  Earlier 06/17/2015.  FINDINGS: Exam demonstrates an enteric feeding tube with tip over the stomach in the left mid abdomen. Bowel gas pattern is nonobstructive. Remainder of the exam is unchanged.  IMPRESSION: No acute findings.  Enteric tube with tip over the stomach in the left mid abdomen.   Electronically Signed   By: Marin Olp M.D.   On: 06/17/2015 16:42   Dg Abd Portable 1v  06/17/2015   CLINICAL DATA:  Feeding tube placement  EXAM: PORTABLE ABDOMEN - 1 VIEW  COMPARISON:  June 09, 2015  FINDINGS: Feeding tube tip is in the stomach. Bowel gas pattern unremarkable. No obstruction or free air is seen on this supine examination. Lung bases are clear.  IMPRESSION: Feeding tube tip in stomach.  Bowel gas pattern unremarkable.   Electronically Signed   By: Lowella Grip III M.D.   On:  06/17/2015 12:36     ASSESSMENT / PLAN:  PULMONARY ETT 7/26>>>8/02 A:  Compromised airway in setting of stroke. P:   F/u CXR as needed  CARDIOVASCULAR Lt IJ TLC 7/26>>> A:  HTN. P:  Continue norvasc, clonidine, hydralazine, labetalol  RENAL A:  Therapeutic hypernatremia  > completed. Mild AKI > resolved. Hypokalemia. P:   Replace electrolytes as needed  GASTROINTESTINAL A:   Dysphagia. Abnormal LFTs > Hepatitis C new diagnosis. P:   NPO F/u with speech Continue tube feeds  HEMATOLOGIC A:  Leukocytosis >> resolved. Hematuria >> improved. Thrombocytopenia. P:  F/u CBC SQ heparin for DVT prophylaxis  INFECTIOUS A:   HCAP with H influenzae >> completed Abx 8/03. P:   Monitor for aspiration   ENDOCRINE A:   Mild hyperglycemia. P:   SSI  NEUROLOGIC A:   ICH with edema. Hx of ETOH, cocaine abuse. P:   Per neurology Thiamine, folic acid, MVI  Chesley Mires, MD [place] 06/18/2015, 9:46 AM Pager:  [phone] After 3pm call: [phone]   CXR reviewed >> no significant signs of aspiration.  Chest exam improved compared to this AM.  Should be okay to transfer to floor bed.  Continue aggressive bronchial hygiene.  PCCM will sign off.  Please call if additional help is needed.  Chesley Mires, MD [facility] Pulmonary/Critical Care 06/18/2015, 10:33 AM Pager:  [phone] After 3pm call: [phone]

## 2015-06-18 NOTE — Evaluation (Signed)
Physical Therapy Re-evaluation Patient Details Name: Mike Rowe MRN: [id number] DOB: 1956-06-01 Today's Date: 06/18/2015   History of Present Illness  Mike Rowe is an 59 y.o. male with no reported past medical history (hasn't been to doctors in years) presents with acute onset of slurred speech and left sided weakness. LSW 2130 when he noted he was unable to stand or use his left side. EMS called, upon arrival noted BP of 240/140. Code stroke activated. CT head imaging reviewed, shows right sided thalamic/BG ICH. 7/26 Transfer to the ICU for intubation and 3% saline. Extubated 8/2    Clinical Impression  Full re-evaluation completed this date as pt more alert and has not had sedation since extubation 8/2. Pt with extension of CVA since initial evaluation. Now with pusher-syndrome, increased Rt gaze preference/Lt neglect. Tolerated EOB and ROM/stretching better today (more alert, following commands with Rt extremities more briskly).     Follow Up Recommendations SNF;Supervision/Assistance - 24 hour    Equipment Recommendations   (TBA)    Recommendations for Other Services       Precautions / Restrictions Precautions Precautions: Fall Restrictions Weight Bearing Restrictions: No      Mobility  Bed Mobility Overal bed mobility: Needs Assistance;+2 for physical assistance;+ 2 for safety/equipment Bed Mobility: Supine to Sit;Sit to Sidelying Rolling: Max assist   Supine to sit: Total assist;+2 for physical assistance;HOB elevated   Sit to sidelying: Max assist;+2 for physical assistance;+2 for safety/equipment General bed mobility comments: pt did use RUE to assist with pull to sit from supine (HOB elevated) and moved RLE off EOB; controlled lean to his Rt side when returning to sidelying  Transfers                 General transfer comment: 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 EOB  Ambulation/Gait                Stairs            Wheelchair  Mobility    Modified Rankin (Stroke Patients Only) Modified Rankin (Stroke Patients Only) Pre-Morbid Rankin Score: No symptoms Modified Rankin: Severe disability     Balance Overall balance assessment: Needs assistance Sitting-balance support: No upper extremity supported;Feet supported Sitting balance-Leahy Scale: Zero Sitting balance - Comments: no righting reactions; RUE pushing when in weightbearing (and falling to his left); able to prop on Rt forearm/elbow without pushing, however still max assist Postural control: Posterior lean;Right lateral lean;Left lateral lean                                   Pertinent Vitals/Pain Pain Assessment: Faces Faces Pain Scale: No hurt (no grimacing, even with stretching neck muscles)    Home Living Family/patient expects to be discharged to:: Skilled nursing facility Living Arrangements: Alone Available Help at Discharge:  (Social Work has been unable to locate/contact son)                  Prior Function Level of Independence: Independent         Comments: Pt unable to provide info, but per chart, he lived alone      Hand Dominance   Dominant Hand: Right    Extremity/Trunk Assessment   Upper Extremity Assessment: Defer to OT evaluation           Lower Extremity Assessment: LLE deficits/detail   LLE Deficits / Details: 0/5 (no volitional movement); +extensor  tone  Cervical / Trunk Assessment: Other exceptions  Communication   Communication: Other (comment) (no attempts to verbalize)  Cognition Arousal/Alertness: Lethargic Behavior During Therapy: Flat affect Overall Cognitive Status: Difficult to assess                 General Comments: follows 1 step commands iwth RUE and RLE with incr time    General Comments      Exercises        Assessment/Plan    PT Assessment Patient needs continued PT services  PT Diagnosis Hemiplegia non-dominant side   PT Problem List Decreased  activity tolerance;Decreased balance;Decreased mobility;Decreased knowledge of use of DME;Decreased safety awareness;Decreased knowledge of precautions;Decreased strength;Decreased range of motion;Impaired sensation;Impaired tone  PT Treatment Interventions DME instruction;Functional mobility training;Therapeutic activities;Therapeutic exercise;Balance training;Patient/family education;Neuromuscular re-education;Cognitive remediation   PT Goals (Current goals can be found in the Care Plan section) Acute Rehab PT Goals Patient Stated Goal: unable PT Goal Formulation: Patient unable to participate in goal setting Time For Goal Achievement: 06/30/15    Frequency Min 3X/week   Barriers to discharge Decreased caregiver support      Co-evaluation               End of Session Equipment Utilized During Treatment: Oxygen Activity Tolerance: Patient tolerated treatment well Patient left: in bed;with call bell/phone within reach;with restraints reapplied;with SCD's reapplied;with nursing/sitter in room Nurse Communication: Mobility status;Other (comment) (neck ROM/positioning)         Time: 3086-5784 PT Time Calculation (min) (ACUTE ONLY): 28 min   Charges:   PT Evaluation $PT Re-evaluation: 1 Procedure PT Treatments $Therapeutic Activity: 8-22 mins   PT G Codes:        Mike Rowe 26-Jun-2015, 10:15 AM Pager [phone]

## 2015-06-18 NOTE — Progress Notes (Signed)
Speech Language Pathology Treatment: Dysphagia  Patient Details Name: Mike Rowe MRN: [id number] DOB: November 04, 1956 Today's Date: 06/18/2015 Time: 2023-3435 SLP Time Calculation (min) (ACUTE ONLY): 11 min  Assessment / Plan / Recommendation Clinical Impression  Pt is more alert this morning with Max faded to Min cues for oral acceptance. He has improved timing for oral preparation with thin liquids and purees. He continues to have delayed coughing with water. Recommend to proceed with MBS this afternoon.   HPI Other Pertinent Information: Mike Rowe is an 59 y.o. male with no reported past medical history (hasn't been to doctors in years) presents with acute onset of slurred speech and left sided weakness. LSW 2130 when he noted he was unable to stand or use his left side. EMS called, upon arrival noted BP of 240/140. Code stroke activated. CT head imaging reviewed, shows right sided thalamic/BG ICH. Denies taking any blood thinners. Pt was intubated 7/26-8/2.   Pertinent Vitals Pain Assessment: Faces Faces Pain Scale: No hurt  SLP Plan  MBS    Recommendations Diet recommendations: NPO Medication Administration: Via alternative means       Oral Care Recommendations: Oral care QID Follow up Recommendations: Skilled Nursing facility Plan: MBS    Germain Osgood, M.A. CCC-SLP [phone]  Germain Osgood 06/18/2015, 11:22 AM

## 2015-06-18 NOTE — Progress Notes (Addendum)
STROKE TEAM PROGRESS NOTE   HISTORY Mike Rowe is an 59 y.o. male with no reported past medical history (hasn't been to doctors in years) presents with acute onset of slurred speech and left sided weakness. LSW 2130 when he noted he was unable to stand or use his left side. EMS called, upon arrival noted BP of 240/140. Code stroke activated. CT head imaging reviewed, shows right sided thalamic/BG ICH. Denies taking any blood thinners.  He denies any past medical history including hypertension. Denies any surgical history. Denies alcohol or drug abuse.  Date last known well: 06/05/2015 Time last known well: 2130 tPA Given: no, ICH Modified Rankin: 0 ICH Score: 1  Initial NIHSS of 12  SUBJECTIVE (INTERVAL HISTORY):   He is more awake and alert and following commands. RN stated that he answered all her questions earlier. However, he still lethargic and pulled his NG tube last night and has to be re-inserted.  Temp:  [97.4 F (36.3 C)-99.7 F (37.6 C)] 97.4 F (36.3 C) (08/04 1141) Pulse Rate:  [57-97] 59 (08/04 1100) Cardiac Rhythm:  [-] Normal sinus rhythm (08/04 0800) Resp:  [15-32] 15 (08/04 1100) BP: (103-186)/(57-99) 113/71 mmHg (08/04 1100) SpO2:  [100 %] 100 % (08/04 1100) Weight:  [142 lb 6.7 oz (64.6 kg)] 142 lb 6.7 oz (64.6 kg) (08/04 0453)   Recent Labs Lab 06/17/15 1924 06/17/15 2324 06/18/15 0318 06/18/15 0757 06/18/15 1141  GLUCAP 110* 137* 132* 88 124*    Recent Labs Lab 06/12/15 0306  06/14/15 0523 06/15/15 0440 06/16/15 0345 06/17/15 0350 06/18/15 0508  NA 155*  < > 153* 153* 151* 153* 150*  K 3.0*  < > 3.2* 3.4* 3.3* 3.5 3.3*  CL 124*  < > 118* 121* 119* 121* 119*  CO2 24  < > 26 28 25 23 22   GLUCOSE 155*  < > 150* 126* 163* 114* 132*  BUN 28*  < > 33* 33* 32* 27* 44*  CREATININE 1.13  < > 0.91 0.88 0.81 0.76 0.94  CALCIUM 8.2*  < > 8.2* 8.2* 8.4* 8.5* 8.6*  MG 2.3  --  2.4  --   --   --   --   PHOS 3.1  --  3.7  --   --   --   --   < > = values in  this interval not displayed.  Recent Labs Lab 06/14/15 0523  AST 94*  ALT 100*  ALKPHOS 44  BILITOT 1.1  PROT 5.9*  ALBUMIN 2.4*    Recent Labs Lab 06/12/15 0306 06/14/15 0523 06/15/15 0440 06/17/15 0350  WBC 12.8* 6.9 6.1 11.0*  NEUTROABS  --   --  4.1 8.4*  HGB 13.0 12.4* 11.6* 13.4  HCT 39.9 37.6* 35.5* 40.9  MCV 97.1 98.4 98.6 96.2  PLT 90* 89* 90* 115*   No results for input(s): CKTOTAL, CKMB, CKMBINDEX, TROPONINI in the last 168 hours. No results for input(s): LABPROT, INR in the last 72 hours. No results for input(s): COLORURINE, LABSPEC, [place], GLUCOSEU, HGBUR, BILIRUBINUR, KETONESUR, PROTEINUR, UROBILINOGEN, NITRITE, LEUKOCYTESUR in the last 72 hours.  Invalid input(s): APPERANCEUR     Component Value Date/Time   CHOL 153 06/16/2015 0345   TRIG 176* 06/16/2015 1035   HDL 16* 06/16/2015 0345   CHOLHDL 9.6 06/16/2015 0345   VLDL 40 06/16/2015 0345   LDLCALC 97 06/16/2015 0345   Lab Results  Component Value Date   HGBA1C 5.2 06/07/2015      Component Value Date/Time  LABOPIA NONE DETECTED 06/06/2015 0100   COCAINSCRNUR POSITIVE* 06/06/2015 0100   LABBENZ NONE DETECTED 06/06/2015 0100   AMPHETMU NONE DETECTED 06/06/2015 0100   THCU NONE DETECTED 06/06/2015 0100   LABBARB NONE DETECTED 06/06/2015 0100    No results for input(s): ETH in the last 168 hours.  Imaging  Ct Head Wo Contrast 06/05/2015    Acute right thalamic hemorrhage.    Ct Head Wo Contrast - ( Pre Fall ) 06/06/2015    Increase in size of right thalamic/posterior right internal capsule hematoma now with maximal transverse dimension 2.3 x 2.2 cm versus prior 2.1 x 1.6 cm.  Increase surrounding vasogenic edema and local mass effect with mild compression the right lateral ventricle without evidence of midline shift. Interval development of blood within the deep dependent aspect of the right lateral ventricle consistent with breakthrough of the right thalamic hemorrhage into the right  lateral ventricle.  Ct Head Wo Contrast - ( Post fall ) 06/06/2015 Right for thalamic hemorrhage with a small amount of intraventricular blood appears unchanged. No new abnormality is seen.  06/15/2015 Essentially no change compared to recent prior study. Hemorrhagic lesion measuring 4.5 x 3.6 arising from the right basal ganglia with surrounding edema, stable. Midline shift of the third ventricle toward the left is stable. Slight ventricular prominence with layering hemorrhage in the occipital horns of the lateral ventricles remain without appreciable change. No change in overall ventricular size and configuration. No new infarct compared to recent prior Study.  CUS - Technically limited due to left IJ line, pt position, and movement. Visualized bilaterally the bifurcation, ICA, and ECA. Appears to be 1-39% ICA stenosis.  2D echo - - Left ventricle: The cavity size was normal. Wall thickness was increased in a pattern of mild LVH. Systolic function was normal. The estimated ejection fraction was in the range of 60% to 65%. Wall motion was normal; there were no regional wall motion abnormalities. Doppler parameters are consistent with abnormal left ventricular relaxation (grade 1 diastolic dysfunction).  Impressions: - No cardiac source of emboli was indentified.  CXR 06/18/15 - Mild bibasilar medial airspace disease most consistent with atelectasis.  PHYSICAL EXAM   Temp:  [97.4 F (36.3 C)-99.7 F (37.6 C)] 97.4 F (36.3 C) (08/04 1141) Pulse Rate:  [57-97] 59 (08/04 1100) Resp:  [15-32] 15 (08/04 1100) BP: (103-186)/(57-99) 113/71 mmHg (08/04 1100) SpO2:  [100 %] 100 % (08/04 1100) Weight:  [142 lb 6.7 oz (64.6 kg)] 142 lb 6.7 oz (64.6 kg) (08/04 0453)  General - Well nourished, well developed, intubated and not following commands.  Ophthalmologic - Fundi not visualized due to noncooperation.  Cardiovascular - Regular rate and rhythm.  Neuro - extubated on  nasal cannula, still lethargic. Pt opens eyes on voice and follows simple commands, such as wiggle toes and showing fingers. PERRL, right sided gaze preference, doll's eyes present, positive corneal and gag and cough, breathing over the vent. On pain stimulation, left UE and LE withdraw to pain, right UE and LE spontaneous movement. LUE increased muscle tone. Babinski positive on the left. DTR decreased on the left. Sensation, coordination and gait not tested.   ASSESSMENT [name] is a 59 y.o. male with no significant past medical history no regular medical follow-up presenting with left hemiparesis really elevated blood pressures. He did not receive IV t-PA due to acute right thalamic hemorrhage.   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 Hematoma had interval enlargement due to agitation  and high BP not at goal.   Resultant  left hemiparesis, less responsive  MRI  not performed  MRA  not performed   Carotid Doppler  technically difficult, but seems unremarkable.  2D Echo - unremarkable  LDL 97  HgbA1c 5.2  SCDs for VTE prophylaxis Diet NPO time specified  no antithrombotic prior to admission, now on no antithrombotic secondary to hemorrhage  Ongoing aggressive stroke risk factor management  Therapy recommendations: Pending   Disposition:  Pending  Cerebral edema  Off 3% saline now  Na 153->151->153 -> 150  BMP daily  Cocaine abuse  Positive on UDS  Hypertension  Home meds:  No antihypertensives medications prior to admission  Unstable, still high  On norvasc 10, clonidine 0.2 tid, hydralazine 100 tid, labetalol 200 bid  BP still fluctates, BP goal 120-140  Hyperlipidemia  LDL 97  No home meds  Hold off statins due to elevated liver enzymes.  Other Stroke Risk Factors  Cigarette smoking  alcohol use  Other Active Problems  Thrombocytopenia  Anxiety / agitation  Mildly elevated BUN   Other Pertinent  History  No family contact yet  Hospital day # 13  Rosalin Hawking, MD PhD Stroke Neurology 06/18/2015 12:21 PM    To contact Stroke Continuity provider, please refer to [email or web address] After hours, contact General Neurology

## 2015-06-18 NOTE — Progress Notes (Signed)
[facility] ADULT ICU REPLACEMENT PROTOCOL FOR AM LAB REPLACEMENT ONLY  The patient does apply for the [facility] Adult ICU Electrolyte Replacment Protocol based on the criteria listed below:   1. Is GFR >/= 40 ml/min? Yes.    Patient's GFR today is >60 2. Is urine output >/= 0.5 ml/kg/hr for the last 6 hours? Yes.   Patient's UOP is 0.7 ml/kg/hr 3. Is BUN < 60 mg/dL? Yes.    Patient's BUN today is 44 4. Abnormal electrolyte(s): K3.3 5. Ordered repletion with: per protocol 6. If a panic level lab has been reported, has the CCM MD in charge been notified? No..   Physician:  Zorita Pang, MD  Mike Rowe, [place] 06/18/2015 6:22 AM

## 2015-06-18 NOTE — Progress Notes (Addendum)
Nutrition Follow-up  DOCUMENTATION CODES:   Severe malnutrition in context of chronic illness  INTERVENTION:  Discontinue Vital AF 1.2 formula.   Initiate Jevity 1.5 formula via NG tube at 35 ml/hr and increase by 10 ml every 4 hours to goal rate of 55 ml/hr with 30 ml Prostat once daily providing 2080 kcal (100% of needs), 99 grams of protein, and 1003 ml of free water.   RD to continue to monitor.  NUTRITION DIAGNOSIS:   Malnutrition related to chronic illness as evidenced by severe depletion of body fat, severe depletion of muscle mass; ongoing  GOAL:   Patient will meet greater than or equal to 90% of their needs; progressing  MONITOR:   TF tolerance, Weight trends, Labs, I & O's, Skin  REASON FOR ASSESSMENT:   Consult Enteral/tube feeding initiation and management  ASSESSMENT:   59 year old male with no known PMH who presents on 7/22 with acute onset slurred speech and inability to stand. Code stroke called, hemorrhagic thalamic stroke noted. On 7/26 patient's CT and mental status worsened with cytotoxic edema and shift then was transferred to the ICU for inability to protect his airway. Patient will also need hypertonic saline.  Pt extubated 8/2. Pt lethargic during time of visit. Per SLP, pt with moderate aspiration risk and recommended pt made NPO. Pt currently has Vital High Protein infusing at 40 ml/hr providing 960 kcal, 84 grams of protein, 806 ml of free water. RD to modify tube feeding orders to meet estimated nutritional needs.   RD to continue to monitor.   Labs: Low potassium, calcium. High BUN and sodium.   Diet Order:  Diet NPO time specified  Skin:  Reviewed, no issues  Last BM:  8/3  Height:   Ht Readings from Last 1 Encounters:  06/05/15 6' (1.829 m)    Weight:   Wt Readings from Last 1 Encounters:  06/18/15 142 lb 6.7 oz (64.6 kg)    Ideal Body Weight:  80.9 kg  BMI:  Body mass index is 19.31 kg/(m^2).  Estimated Nutritional  Needs:   Kcal:  2000-2200  Protein:  90-105 grams  Fluid:  Per MD  EDUCATION NEEDS:   No education needs identified at this time  Corrin [place], MS, RD, LDN Pager # [phone] After hours/ weekend pager # [phone]

## 2015-06-18 NOTE — Clinical Social Work Note (Signed)
Clinical Social Work Assessment  Patient Details  Name: Mike Rowe MRN: [id number] Date of Birth: 01-24-1956  Date of referral:  06/15/15               Reason for consult:  Facility Placement                Permission sought to share information with:  Other (Patient with no emergency contact and/or family - ) Permission granted to share information::  No  Name::        Agency::     Relationship::     Contact Information:     Housing/Transportation Living arrangements for the past 2 months:  Single Family Home Source of Information:  Event organiser, Engineer, materials (Patient previous employer) Patient Interpreter Needed:  None Criminal Activity/Legal Involvement Pertinent to Current Situation/Hospitalization:  No - Comment as needed Significant Relationships:  None Lives with:  Self Do you feel safe going back to the place where you live?  No Need for family participation in patient care:  Yes (Comment) (No family to address patient care needs)  Care giving concerns:  No family/friends at bedside or available by phone   Social Worker assessment / plan:  Clinical Social Worker following patient for support and attempts to locate family. CSW was able to get in contact with patient previous employer, Mike Rowe, who states that patient worked for him in [place] about 6 years ago and left on unsettling terms to move to [place] about 2 years ago. Mike Rowe remembers patient speaking of a son Mike Rowe. Goes by Mike Rowe) and an ex wife. Throughout the original conversation, Mike Rowe requested no involvement in patient care, however Mike Rowe returned call several hours later asking to provide assistance on patient behalf. Mike Rowe states that he plans to communicate with patient friends in [place] to determine if anyone has contact information for patient family members. CSW has updated supervisor and contacted Mike Rowe regarding potential guardianship needs.  Patient currently remains intubated with  no family/friends for assistance in decision making.  CSW initiated SNF search within a 50 mile radius of [place] based on the best interest of the patient - no permission received from patient and/or family at this time. CSW remains available for support and to assist with discharge planning needs.  Employment status:  Unemployed Forensic scientist:  Self Pay (Medicaid Pending) PT Recommendations:  [place] / Referral to community resources:  [place]  Patient/Family's Response to care:  No response to care due to patient inability to participate and no family available.  Patient/Family's Understanding of and Emotional Response to Diagnosis, Current Treatment, and Prognosis:  Unable to further communicate regarding patient emotional response at this time.  Emotional Assessment Appearance:  Appears older than stated age Attitude/Demeanor/Rapport:  Unable to Assess Affect (typically observed):  Unable to Assess Orientation:  Oriented to Self Alcohol / Substance use:  Alcohol Use Psych involvement (Current and /or in the community):  No (Comment)  Discharge Needs  Concerns to be addressed:  Cognitive Concerns, Substance Abuse Concerns, Decision making concerns, Discharge Planning Concerns Readmission within the last 30 days:  No Current discharge risk:  Cognitively Impaired, Lack of support system, Lives alone Barriers to Discharge:  Family Issues, Continued Medical Work up, Inadequate or no insurance  [place], [place]

## 2015-06-18 NOTE — Progress Notes (Signed)
Patient arrived from [facility] accompanied by sitter and restraint. Safety precautions and orders reviewed with patient. TELE applied and confirmed. Will continue to monitor.   Ave Filter, RN

## 2015-06-18 NOTE — Clinical Social Work Placement (Signed)
   CLINICAL SOCIAL WORK PLACEMENT  NOTE  Date:  06/18/2015  Patient Details  Name: Lavance Beazer MRN: [id number] Date of Birth: 1956/08/22  Clinical Social Work is seeking post-discharge placement for this patient at the [place] level of care (*CSW will initial, date and re-position this form in  chart as items are completed):  No   Patient/family provided with [facility] Work Department's list of facilities offering this level of care within the geographic area requested by the patient (or if unable, by the patient's family).  No   Patient/family informed of their freedom to choose among providers that offer the needed level of care, that participate in Medicare, Medicaid or managed care program needed by the patient, have an available bed and are willing to accept the patient.  No   Patient/family informed of [facility]'s ownership interest in [facility] and [facility], as well as of the fact that they are under no obligation to receive care at these facilities.  PASRR submitted to EDS on 06/15/15     PASRR number received on 06/15/15     Existing PASRR number confirmed on       FL2 transmitted to all facilities in geographic area requested by pt/family on 06/15/15     FL2 transmitted to all facilities within larger geographic area on 06/15/15     Patient informed that his/her managed care company has contracts with or will negotiate with certain facilities, including the following:            Patient/family informed of bed offers received.  Patient chooses bed at       Physician recommends and patient chooses bed at      Patient to be transferred to   on  .  Patient to be transferred to facility by       Patient family notified on   of transfer.  Name of family member notified:        PHYSICIAN Please sign FL2     Additional Comment:    Barbette Or, [place]

## 2015-06-18 NOTE — Care Management Note (Signed)
Case Management Note  Patient Details  Name: Mike Rowe MRN: [id number] Date of Birth: 02-08-1956  Subjective/Objective:                    Action/Plan: UR updated   Expected Discharge Date:                  Expected Discharge Plan:  Skilled Nursing Facility  In-House Referral:  Clinical Social Work  Discharge planning Services  CM Consult  Post Acute Care Choice:    Choice offered to:     DME Arranged:    DME Agency:     HH Arranged:    [place] Agency:     Status of Service:  In process, will continue to follow  Medicare Important Message Given:    Date Medicare IM Given:    Medicare IM give by:    Date Additional Medicare IM Given:    Additional Medicare Important Message give by:     If discussed at [place] of Stay Meetings, dates discussed:  06-18-15   Additional Comments:  Marilu Favre, RN 06/18/2015, 11:19 AM

## 2015-06-18 NOTE — Progress Notes (Signed)
SLP Cancellation Note  Patient Details Name: Mike Rowe MRN: [id number] DOB: [birth year]   Cancelled treatment:       Reason Eval/Treat Not Completed: Fatigue/lethargy limiting ability to participate. Pt more tired this afternoon and unable to maintain alertness for oral acceptance of POs. MBS will have to be deferred - will continue efforts.   Germain Osgood, M.A. CCC-SLP [phone]  Germain Osgood 06/18/2015, 1:52 PM

## 2015-06-18 NOTE — Clinical Social Work Note (Signed)
Clinical Social Worker continuing to follow patient for support and discharge planning needs.  Patient with a PANDA and plans for MBS today at 13:00.  Patient plans to transfer to the floor - CSW to update unit CSW to further address patient needs and discharge plans.  Barbette Or, [place]

## 2015-06-18 NOTE — Progress Notes (Signed)
Paged MD regarding respirations of 28. MD ordered STAT CT as NIH has gone up since dayshift. Will continue to monitor. Ruben Gottron, RN 06/18/2015 10:26 PM

## 2015-06-19 ENCOUNTER — Inpatient Hospital Stay (HOSPITAL_COMMUNITY): Payer: Medicaid Other

## 2015-06-19 LAB — CBC
HCT: 40.6 % (ref 39.0–52.0)
Hemoglobin: 13.1 g/dL (ref 13.0–17.0)
MCH: 31.3 pg (ref 26.0–34.0)
MCHC: 32.3 g/dL (ref 30.0–36.0)
MCV: 97.1 fL (ref 78.0–100.0)
PLATELETS: 115 10*3/uL — AB (ref 150–400)
RBC: 4.18 MIL/uL — ABNORMAL LOW (ref 4.22–5.81)
RDW: 13.8 % (ref 11.5–15.5)
WBC: 7 10*3/uL (ref 4.0–10.5)

## 2015-06-19 LAB — COMPREHENSIVE METABOLIC PANEL
ALK PHOS: 58 U/L (ref 38–126)
ALT: 346 U/L — AB (ref 17–63)
AST: 236 U/L — ABNORMAL HIGH (ref 15–41)
Albumin: 2.7 g/dL — ABNORMAL LOW (ref 3.5–5.0)
Anion gap: 6 (ref 5–15)
BILIRUBIN TOTAL: 0.7 mg/dL (ref 0.3–1.2)
BUN: 41 mg/dL — AB (ref 6–20)
CHLORIDE: 126 mmol/L — AB (ref 101–111)
CO2: 21 mmol/L — ABNORMAL LOW (ref 22–32)
CREATININE: 0.96 mg/dL (ref 0.61–1.24)
Calcium: 8.6 mg/dL — ABNORMAL LOW (ref 8.9–10.3)
GFR calc non Af Amer: >60 mL/min (ref >60)
GLUCOSE: 135 mg/dL — AB (ref 65–99)
POTASSIUM: 3.4 mmol/L — AB (ref 3.5–5.1)
Sodium: 153 mmol/L — ABNORMAL HIGH (ref 135–145)
Total Protein: 6.2 g/dL — ABNORMAL LOW (ref 6.5–8.1)

## 2015-06-19 LAB — GLUCOSE, CAPILLARY
GLUCOSE-CAPILLARY: 107 mg/dL — AB (ref 65–99)
GLUCOSE-CAPILLARY: 114 mg/dL — AB (ref 65–99)
GLUCOSE-CAPILLARY: 139 mg/dL — AB (ref 65–99)
GLUCOSE-CAPILLARY: 146 mg/dL — AB (ref 65–99)
Glucose-Capillary: 129 mg/dL — ABNORMAL HIGH (ref 65–99)
Glucose-Capillary: 134 mg/dL — ABNORMAL HIGH (ref 65–99)

## 2015-06-19 LAB — LACTIC ACID, PLASMA
Lactic Acid, Venous: 1.6 mmol/L (ref 0.5–2.0)
Lactic Acid, Venous: 1.8 mmol/L (ref 0.5–2.0)

## 2015-06-19 LAB — TROPONIN I
TROPONIN I: 0.09 ng/mL — AB (ref <0.031)
Troponin I: 0.09 ng/mL — ABNORMAL HIGH (ref <0.031)

## 2015-06-19 LAB — MAGNESIUM: Magnesium: 2.5 mg/dL — ABNORMAL HIGH (ref 1.7–2.4)

## 2015-06-19 LAB — BRAIN NATRIURETIC PEPTIDE: B NATRIURETIC PEPTIDE 5: 349.3 pg/mL — AB (ref 0.0–100.0)

## 2015-06-19 LAB — PHOSPHORUS: PHOSPHORUS: 3.8 mg/dL (ref 2.5–4.6)

## 2015-06-19 MED ORDER — LABETALOL HCL 200 MG PO TABS: 200.0000 mg | ORAL_TABLET | Freq: Three times a day (TID) | ORAL | Status: DC

## 2015-06-19 MED ORDER — IPRATROPIUM-ALBUTEROL 0.5-2.5 (3) MG/3ML IN SOLN
3.0000 mL | Freq: Four times a day (QID) | RESPIRATORY_TRACT | Status: DC
  Administered 2015-06-19 – 2015-06-20 (×5): 3 mL via RESPIRATORY_TRACT
  Filled 2015-06-19 (×5): qty 3

## 2015-06-19 MED ORDER — IOHEXOL 350 MG/ML SOLN
100.0000 mL | Freq: Once | INTRAVENOUS | Status: AC | PRN
  Administered 2015-06-19: 70 mL via INTRAVENOUS

## 2015-06-19 MED ORDER — DEXTROSE 5 % IV SOLN
1.0000 g | Freq: Three times a day (TID) | INTRAVENOUS | Status: DC
  Administered 2015-06-19 – 2015-06-20 (×2): 1 g via INTRAVENOUS
  Filled 2015-06-19 (×4): qty 1

## 2015-06-19 MED ORDER — VANCOMYCIN HCL IN DEXTROSE 750-5 MG/150ML-% IV SOLN
750.0000 mg | Freq: Two times a day (BID) | INTRAVENOUS | Status: DC
  Administered 2015-06-20: 750 mg via INTRAVENOUS
  Filled 2015-06-19 (×2): qty 150

## 2015-06-19 MED ORDER — ALBUTEROL SULFATE (2.5 MG/3ML) 0.083% IN NEBU
2.5000 mg | INHALATION_SOLUTION | RESPIRATORY_TRACT | Status: DC | PRN
  Administered 2015-06-19: 2.5 mg via RESPIRATORY_TRACT
  Filled 2015-06-19: qty 3

## 2015-06-19 MED ORDER — FREE WATER
300.0000 mL | Freq: Four times a day (QID) | Status: DC
  Administered 2015-06-19 – 2015-06-27 (×28): 300 mL

## 2015-06-19 MED ORDER — VANCOMYCIN HCL IN DEXTROSE 1-5 GM/200ML-% IV SOLN
1000.0000 mg | Freq: Once | INTRAVENOUS | Status: AC
  Administered 2015-06-19: 1000 mg via INTRAVENOUS
  Filled 2015-06-19: qty 200

## 2015-06-19 MED ORDER — LABETALOL HCL 100 MG PO TABS
300.0000 mg | ORAL_TABLET | Freq: Three times a day (TID) | ORAL | Status: DC
  Administered 2015-06-19 – 2015-06-27 (×24): 300 mg via ORAL
  Filled 2015-06-19 (×48): qty 1

## 2015-06-19 MED ORDER — POTASSIUM CHLORIDE 20 MEQ/15ML (10%) PO SOLN
20.0000 meq | Freq: Two times a day (BID) | ORAL | Status: AC
  Administered 2015-06-20 – 2015-06-21 (×4): 20 meq
  Filled 2015-06-19 (×7): qty 15

## 2015-06-19 NOTE — Progress Notes (Signed)
Pt rhonchi has worsened since this am, O2 sats maintaining at 92%, pt placed on 2 L O2 via [place].  MD notified, RRT notified also.  Nebs and AntiBx ordered, will follow up with new orders.

## 2015-06-19 NOTE — Progress Notes (Signed)
ANTIBIOTIC CONSULT NOTE - INITIAL  Pharmacy Consult:  Vancomycin / Tressie Ellis Indication:  PNA  No Known Allergies  Patient Measurements: Height: 6' (182.9 cm) Weight: 143 lb 6.4 oz (65.046 kg) IBW/kg (Calculated) : 77.6  Vital Signs: Temp: 98.1 F (36.7 C) (08/05 1339) Temp Source: Axillary (08/05 1339) BP: 153/83 mmHg (08/05 1339) Pulse Rate: 92 (08/05 1339) Intake/Output from previous day: 08/04 0701 - 08/05 0700 In: 322 [NG/GT:292] Out: 50 [Emesis/NG output:50] Intake/Output from this shift: Total I/O In: 485 [NG/GT:485] Out: -   Labs:  Recent Labs  06/17/15 0350 06/18/15 0508 06/19/15 0506  WBC 11.0*  --  7.0  HGB 13.4  --  13.1  PLT 115*  --  115*  CREATININE 0.76 0.94 0.96   Estimated Creatinine Clearance: 75.2 mL/min (by C-G formula based on Cr of 0.96). No results for input(s): VANCOTROUGH, VANCOPEAK, VANCORANDOM, GENTTROUGH, GENTPEAK, GENTRANDOM, TOBRATROUGH, TOBRAPEAK, TOBRARND, AMIKACINPEAK, AMIKACINTROU, AMIKACIN in the last 72 hours.   Microbiology: Recent Results (from the past 720 hour(s))  MRSA PCR Screening     Status: None   Collection Time: 06/05/15 12:56 AM  Result Value Ref Range Status   MRSA by PCR NEGATIVE NEGATIVE Final    Comment:        The GeneXpert MRSA Assay (FDA approved for NASAL specimens only), is one component of a comprehensive MRSA colonization surveillance program. It is not intended to diagnose MRSA infection nor to guide or monitor treatment for MRSA infections.   Culture, blood (routine x 2)     Status: None   Collection Time: 06/11/15  5:03 PM  Result Value Ref Range Status   Specimen Description BLOOD LEFT HAND  Final   Special Requests BOTTLES DRAWN AEROBIC AND ANAEROBIC 10CC  Final   Culture NO GROWTH 5 DAYS  Final   Report Status 06/16/2015 FINAL  Final  Culture, blood (routine x 2)     Status: None   Collection Time: 06/11/15  5:05 PM  Result Value Ref Range Status   Specimen Description BLOOD RIGHT ARM   Final   Special Requests BOTTLES DRAWN AEROBIC AND ANAEROBIC 6CC  Final   Culture NO GROWTH 5 DAYS  Final   Report Status 06/16/2015 FINAL  Final  Culture, respiratory (NON-Expectorated)     Status: None   Collection Time: 06/11/15  5:18 PM  Result Value Ref Range Status   Specimen Description TRACHEAL ASPIRATE  Final   Special Requests NONE  Final   Gram Stain   Final    ABUNDANT WBC PRESENT, PREDOMINANTLY PMN RARE SQUAMOUS EPITHELIAL CELLS PRESENT MODERATE GRAM NEGATIVE RODS MODERATE GRAM POSITIVE COCCI IN PAIRS    Culture   Final    MODERATE HAEMOPHILUS INFLUENZAE Note: BETA LACTAMASE NEGATIVE MODERATE STREPTOCOCCUS,BETA HEMOLYTIC NOT GROUP A Performed at [facility]    Report Status 06/14/2015 FINAL  Final  Culture, Urine     Status: None   Collection Time: 06/11/15  6:29 PM  Result Value Ref Range Status   Specimen Description URINE, CATHETERIZED  Final   Special Requests PATIENT ON FOLLOWING VANC AND ZOSYN  Final   Culture NO GROWTH 2 DAYS  Final   Report Status 06/13/2015 FINAL  Final    Medical History: History reviewed. No pertinent past medical history.    Assessment: 59 YOM previously on vancomycin and Zosyn for rule out PNA, then narrowed to Rocephin.  Now to resume antibiotics with vancomycin and Fortaz for developing HCAP vs tracheobronchitis.  Patient's renal function has been  stable.  Vanc 7/28 >> 7/31, resumed 8/5 >> Zosyn 7/28 >> 8/1 CTX 8/1 >> 8/3 Tressie Ellis 8/5 >>  7/28 UCx - negative 7/28 TA cx - negative 7/28 BCx x2 - negative   Goal of Therapy:  Vancomycin trough level 15-20 mcg/ml   Plan:  - Vanc 1gm IV x 1, then 750mg  IV Q12H - Fortaz 1gm IV Q8H - Monitor renal fxn, clinical progress, vanc trough as indicated    Ifeoma Vallin D. Mina Marble, PharmD, BCPS Pager:  [phone] 06/19/2015, 5:03 PM

## 2015-06-19 NOTE — Progress Notes (Signed)
STROKE TEAM PROGRESS NOTE   HISTORY Mike Rowe is an 59 y.o. male with no reported past medical history (hasn't been to doctors in years) presents with acute onset of slurred speech and left sided weakness. LSW 2130 when he noted he was unable to stand or use his left side. EMS called, upon arrival noted BP of 240/140. Code stroke activated. CT head imaging reviewed, shows right sided thalamic/BG ICH. Denies taking any blood thinners.  He denies any past medical history including hypertension. Denies any surgical history. Denies alcohol or drug abuse.  Date last known well: 06/05/2015 Time last known well: 2130 tPA Given: no, ICH Modified Rankin: 0 ICH Score: 1  Initial NIHSS of 12   SUBJECTIVE (INTERVAL HISTORY):   No family members present. The patient has been transferred to a MedSurg floor. The nursing staff noted an increased respiratory rate. There is concern for possible aspiration pneumonia. The patient follows minimal commands. Will repeat CXR, and request IM consult.   Temp:  [98 F (36.7 C)-100.8 F (38.2 C)] 98.1 F (36.7 C) (08/05 1339) Pulse Rate:  [80-102] 92 (08/05 1339) Cardiac Rhythm:  [-] Normal sinus rhythm (08/04 2050) Resp:  [18-28] 25 (08/05 1339) BP: (134-176)/(69-98) 153/83 mmHg (08/05 1339) SpO2:  [94 %-99 %] 95 % (08/05 1339) Weight:  [65.046 kg (143 lb 6.4 oz)] 65.046 kg (143 lb 6.4 oz) (08/05 0500)   Recent Labs Lab 06/18/15 1950 06/19/15 06/19/15 0356 06/19/15 0826 06/19/15 1113  GLUCAP 113* 107* 114* 129* 139*    Recent Labs Lab 06/14/15 0523 06/15/15 0440 06/16/15 0345 06/17/15 0350 06/18/15 0508 06/19/15 0506  NA 153* 153* 151* 153* 150* 153*  K 3.2* 3.4* 3.3* 3.5 3.3* 3.4*  CL 118* 121* 119* 121* 119* 126*  CO2 26 28 25 23 22  21*  GLUCOSE 150* 126* 163* 114* 132* 135*  BUN 33* 33* 32* 27* 44* 41*  CREATININE 0.91 0.88 0.81 0.76 0.94 0.96  CALCIUM 8.2* 8.2* 8.4* 8.5* 8.6* 8.6*  MG 2.4  --   --   --   --  2.5*  PHOS 3.7  --   --    --   --  3.8    Recent Labs Lab 06/14/15 0523 06/19/15 0506  AST 94* 236*  ALT 100* 346*  ALKPHOS 44 58  BILITOT 1.1 0.7  PROT 5.9* 6.2*  ALBUMIN 2.4* 2.7*    Recent Labs Lab 06/14/15 0523 06/15/15 0440 06/17/15 0350 06/19/15 0506  WBC 6.9 6.1 11.0* 7.0  NEUTROABS  --  4.1 8.4*  --   HGB 12.4* 11.6* 13.4 13.1  HCT 37.6* 35.5* 40.9 40.6  MCV 98.4 98.6 96.2 97.1  PLT 89* 90* 115* 115*    Recent Labs Lab 06/19/15 1415  TROPONINI 0.09*   No results for input(s): LABPROT, INR in the last 72 hours. No results for input(s): COLORURINE, LABSPEC, [place], GLUCOSEU, HGBUR, BILIRUBINUR, KETONESUR, PROTEINUR, UROBILINOGEN, NITRITE, LEUKOCYTESUR in the last 72 hours.  Invalid input(s): APPERANCEUR     Component Value Date/Time   CHOL 153 06/16/2015 0345   TRIG 176* 06/16/2015 1035   HDL 16* 06/16/2015 0345   CHOLHDL 9.6 06/16/2015 0345   VLDL 40 06/16/2015 0345   LDLCALC 97 06/16/2015 0345   Lab Results  Component Value Date   HGBA1C 5.2 06/07/2015      Component Value Date/Time   LABOPIA NONE DETECTED 06/06/2015 0100   COCAINSCRNUR POSITIVE* 06/06/2015 0100   LABBENZ NONE DETECTED 06/06/2015 0100   AMPHETMU NONE DETECTED 06/06/2015  0100   THCU NONE DETECTED 06/06/2015 0100   LABBARB NONE DETECTED 06/06/2015 0100    No results for input(s): ETH in the last 168 hours.  Imaging  Ct Head Wo Contrast 06/05/2015    Acute right thalamic hemorrhage.    Ct Head Wo Contrast - ( Pre Fall ) 06/06/2015    Increase in size of right thalamic/posterior right internal capsule hematoma now with maximal transverse dimension 2.3 x 2.2 cm versus prior 2.1 x 1.6 cm.  Increase surrounding vasogenic edema and local mass effect with mild compression the right lateral ventricle without evidence of midline shift. Interval development of blood within the deep dependent aspect of the right lateral ventricle consistent with breakthrough of the right thalamic hemorrhage into the right  lateral ventricle.  Ct Head Wo Contrast - ( Post fall ) 06/06/2015 Right for thalamic hemorrhage with a small amount of intraventricular blood appears unchanged. No new abnormality is seen.  06/15/2015 Essentially no change compared to recent prior study. Hemorrhagic lesion measuring 4.5 x 3.6 arising from the right basal ganglia with surrounding edema, stable. Midline shift of the third ventricle toward the left is stable. Slight ventricular prominence with layering hemorrhage in the occipital horns of the lateral ventricles remain without appreciable change. No change in overall ventricular size and configuration. No new infarct compared to recent prior Study.  06/19/15  Limited assessment due to patient positioning within the CT scanner. Evolving RIGHT thalamic hemorrhage with intraventricular extension, stable moderate hydrocephalus.   CUS - Technically limited due to left IJ line, pt position, and movement. Visualized bilaterally the bifurcation, ICA, and ECA. Appears to be 1-39% ICA stenosis.  2D echo - - Left ventricle: The cavity size was normal. Wall thickness was increased in a pattern of mild LVH. Systolic function was normal. The estimated ejection fraction was in the range of 60% to 65%. Wall motion was normal; there were no regional wall motion abnormalities. Doppler parameters are consistent with abnormal left ventricular relaxation (grade 1 diastolic dysfunction).  Impressions: - No cardiac source of emboli was indentified.  PHYSICAL EXAM   Temp:  [98 F (36.7 C)-100.8 F (38.2 C)] 98.1 F (36.7 C) (08/05 1339) Pulse Rate:  [80-102] 92 (08/05 1339) Resp:  [18-28] 25 (08/05 1339) BP: (134-176)/(69-98) 153/83 mmHg (08/05 1339) SpO2:  [94 %-99 %] 95 % (08/05 1339) Weight:  [65.046 kg (143 lb 6.4 oz)] 65.046 kg (143 lb 6.4 oz) (08/05 0500)  General - Well nourished, well developed, intubated and not following commands.  Ophthalmologic - Fundi not  visualized due to noncooperation.  Cardiovascular - Regular rate and rhythm.  Neuro - extubated on nasal cannula, with increased RR, mild respiratory failure.. Pt opens eyes on voice and follows some simple commands, such as wiggle toes and showing fingers. PERRL, right sided gaze preference, doll's eyes present, positive corneal and gag and cough, breathing over the vent. On pain stimulation, left UE and LE withdraw to pain, right UE and LE spontaneous movement. LUE increased muscle tone. Babinski positive on the left. DTR decreased on the left. Sensation, coordination and gait not tested.   ASSESSMENT Mr. Ephrem Carrick is a 59 y.o. male with no significant past medical history no regular medical follow-up presenting with left hemiparesis really elevated blood pressures. He did not receive IV t-PA due to acute right thalamic hemorrhage.   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 Hematoma had interval enlargement due to agitation and high BP not at goal.  Resultant  left hemiparesis, less responsive  MRI  not performed  MRA  not performed   Carotid Doppler  technically difficult, but seems unremarkable.  2D Echo - unremarkable  LDL 97  HgbA1c 5.2  SCDs for VTE prophylaxis Diet NPO time specified  no antithrombotic prior to admission, now on no antithrombotic secondary to hemorrhage  Ongoing aggressive stroke risk factor management  Therapy recommendations: Pending   Disposition:  Pending  Cerebral edema  Off 3% saline now  Na 153->151->153 -> 150 -> 153  Close monitoring  Off precedex  Cocaine abuse  Positive on UDS  Hypertension  Home meds:  No antihypertensives medications prior to admission  Unstable, still high  On norvasc, clonidine, hydralazine, labetalol  Increase clonidine to 0.2mg  tid  Hyperlipidemia  LDL 97  No home meds  Hold off statins due to elevated liver enzymes.  Respiratory - mild acute  distress  BNP today 149.3 (H)  CXR - low lung volumes with mild bibasilar atelectasis - stable  Lactic acid 1.6 within normal range  Troponin 0.09 (H) - every 6 hours 3  CMP - sodium 153- potassium 3.4- chloride- 126 CO2- 21 BUN- 41 creatinine- 0.96 AST- 236 ALT- 346  CBC - WBCs 7.0  Appreciate internal medicine consult Dr. Broadus [name] (possible PE risk noted per Dr. Broadus [name])  Supplement mildly low potassium  Other Stroke Risk Factors  Cigarette smoking  alcohol use  Other Active Problems  Thrombocytopenia  Anxiety / agitation  Mildly elevated BUN   Other Pertinent History  No family contact yet  Hospital day # Alpha PA-C [facility] Pager [phone] 06/19/2015, 3:39 PM    This patient is critically ill due to right ICH and respiratory failue and at significant risk of neurological worsening, death form rebleeding and seizure and cerebral edema. This patient's care requires constant monitoring of vital signs, hemodynamics, respiratory and cardiac monitoring, review of multiple databases, neurological assessment, discussion with family, other specialists and medical decision making of high complexity. I spent 35 minutes of neurocritical care time in the care of this patient.  Rosalin Hawking, MD PhD Stroke Neurology 06/20/2015 12:49 AM     To contact Stroke Continuity provider, please refer to [email or web address] After hours, contact General Neurology

## 2015-06-19 NOTE — Progress Notes (Signed)
PT Cancellation Note  Patient Details Name: Mike Rowe MRN: [id number] DOB: 09-11-1956   Cancelled Treatment:    Reason Eval/Treat Not Completed: Medical issues which prohibited therapy Noted that STAT CT was ordered last night as pt's NIH score had increased. Radiology reports evolving right thalamic hemorrhage. Most recent BP (165/91) exceeding goal of 120-140. Will hold therapy at this time. Will continue to follow.  Ellouise Newer 06/19/2015, 8:30 AM Elayne Snare, [place]

## 2015-06-19 NOTE — Consult Note (Addendum)
[facility] Medical Consultation  Mike Rowe ZOX:[id number] DOB: 07/14/1956 DOA: 06/05/2015 PCP: No primary care provider on file.   Requesting physician:Dr.Xu Date of consultation: 06/19/15 Reason for consultation: tachypnea  Impression/Recommendations  1. Tachypnea/cough -I suspect he may be starting to develop an infection/fever, Early HCAP vs Tracheobronchitis -However he is also at high risk for PE due to prolonged hospitalization and inability to be on anticoagulation for prophylaxis until yesterday -Chest x-ray today unremarkable -We'll check lactic acid, will get a CT angiogram of his chest to rule out PE -Suspect pulmonary source, possible aspiration as etiology -Repeat chest x-ray tomorrow a CTA unremarkable -He is nothing by mouth and on tube feeds via Panda tube  Addendum: temp of 100.8, CTA no PE, given diffuse ronchi, will start nebs for Pulm toilet, RT suctioning, and start Broad spectrum Abx for Tracheobronchitis/Early HCAP/VAP  2. Abnormal LFTs -Could be related to cholestasis/tube feeds -Cmet in a.m. if worsening would recommend right upper quadrant ultrasound  3. Hypernatremia -will add free water  Rest of his neurological problems per neurology   I will followup again tomorrow. Please contact me if I can be of assistance in the meanwhile. Thank you for this consultation.  Chief Complaint: CVA  HPI: This is 59 year old male with history of alcoholism who was admitted with right basal ganglia hemorrhagic stroke to the neurology service his hospital course was complicated by alcohol withdrawal had been intubated for airway protection through most of his ICU stay was extubated 3 days ago and transferred to the floor yesterday. Since yesterday he has had intermittent episodes of tachypnea, patient is nonverbal and afebrile 6 and since massive strokes. Primary service did a chest x-ray which was unremarkable, TSH consultation obtained due to ongoing tachypnea and  cough.   Review of Systems:  Unable to obtain due to mentation  History reviewed. No pertinent past medical history. History reviewed. No pertinent past surgical history. Social History:  has no tobacco, alcohol, and drug history on file.  No Known Allergies History reviewed. No pertinent family history.  Prior to Admission medications   Not on File   Physical Exam: Blood pressure 176/98, pulse 102, temperature 98.2 F (36.8 C), temperature source Oral, resp. rate 26, height 6' (1.829 m), weight 65.046 kg (143 lb 6.4 oz), SpO2 94 %. Filed Vitals:   06/19/15 1207  BP:   Pulse:   Temp: 98.2 F (36.8 C)  Resp:      General: sleeping and withdraws to pain, not following my commands at this time  HEENT: PERRLA  Cardiovascular: S1S2/tachycardic  Respiratory: scattered ronchi  Abdomen: soft, non tyender  Skin: no rashes  Musculoskeletal: no edema  Psychiatric: unable to assess  Neurologic: poorly responsive, withdraws to pain, hemiplegic  Labs on Admission:  Basic Metabolic Panel:  Recent Labs Lab 06/14/15 0523 06/15/15 0440 06/16/15 0345 06/17/15 0350 06/18/15 0508 06/19/15 0506  NA 153* 153* 151* 153* 150* 153*  K 3.2* 3.4* 3.3* 3.5 3.3* 3.4*  CL 118* 121* 119* 121* 119* 126*  CO2 26 28 25 23 22  21*  GLUCOSE 150* 126* 163* 114* 132* 135*  BUN 33* 33* 32* 27* 44* 41*  CREATININE 0.91 0.88 0.81 0.76 0.94 0.96  CALCIUM 8.2* 8.2* 8.4* 8.5* 8.6* 8.6*  MG 2.4  --   --   --   --  2.5*  PHOS 3.7  --   --   --   --  3.8   Liver Function Tests:  Recent Labs Lab 06/14/15 [phone]  06/19/15 0506  AST 94* 236*  ALT 100* 346*  ALKPHOS 44 58  BILITOT 1.1 0.7  PROT 5.9* 6.2*  ALBUMIN 2.4* 2.7*   No results for input(s): LIPASE, AMYLASE in the last 168 hours. No results for input(s): AMMONIA in the last 168 hours. CBC:  Recent Labs Lab 06/14/15 0523 06/15/15 0440 06/17/15 0350 06/19/15 0506  WBC 6.9 6.1 11.0* 7.0  NEUTROABS  --  4.1 8.4*  --   HGB  12.4* 11.6* 13.4 13.1  HCT 37.6* 35.5* 40.9 40.6  MCV 98.4 98.6 96.2 97.1  PLT 89* 90* 115* 115*   Cardiac Enzymes: No results for input(s): CKTOTAL, CKMB, CKMBINDEX, TROPONINI in the last 168 hours. BNP: Invalid input(s): POCBNP CBG:  Recent Labs Lab 06/18/15 1950 06/19/15 06/19/15 0356 06/19/15 0826 06/19/15 1113  GLUCAP 113* 107* 114* 129* 139*    Radiological Exams on Admission: Ct Head Wo Contrast  06/18/2015   CLINICAL DATA:  Follow-up intracranial hemorrhage. History of hypertension, cocaine abuse, alcohol dependence.  EXAM: CT HEAD WITHOUT CONTRAST  TECHNIQUE: Contiguous axial images were obtained from the base of the skull through the vertex without intravenous contrast.  COMPARISON:  CT head June 15, 2015 and CT head June 05, 2015  FINDINGS: Patient is obliqued in the scanner, limiting evaluation.  4.6 x 3.3 cm evolving RIGHT thalamus intraparenchymal hematoma with surrounding low-density vasogenic edema. Intraventricular extension with small amount of dependent blood products in the occipital horns. Partially effaced RIGHT temporal horn, stable moderate hydrocephalus with mild interstitial edema. Local mass effect, limited assessment for midline shift due to patient positioning. No acute large vascular territory infarct.  No abnormal extra-axial fluid collections. Stable density along the falx most consistent with calcification. Basal cisterns appear patent though, decreased by positioning. Moderate calcific atherosclerosis the carotid siphons. No skull fracture. Nasogastric tube in place.  IMPRESSION: Limited assessment due to patient positioning within the CT scanner.  Evolving RIGHT thalamic hemorrhage with intraventricular extension, stable moderate hydrocephalus.   Electronically Signed   By: Elon Alas M.D.   On: 06/18/2015 23:54   Dg Chest Port 1 View  06/19/2015   CLINICAL DATA:  Respiratory distress.  EXAM: PORTABLE CHEST - 1 VIEW  COMPARISON:  One-view chest  06/18/2015  FINDINGS: The heart size is exaggerated by low lung volumes. Mild pulmonary vascular congestion is again noted. Minimal right basilar atelectasis is present. The left IJ line has been removed. There is no pneumothorax. A feeding tube courses off the inferior border of the film.  IMPRESSION: 1. Persistent low lung volumes exaggerating the heart size. 2. Mild bibasilar atelectasis is stable. 3. Interval removal of left IJ line without complication.   Electronically Signed   By: San Morelle M.D.   On: 06/19/2015 11:44   Dg Chest Port 1 View  06/18/2015   CLINICAL DATA:  Subsequent evaluation airway aspiration  EXAM: PORTABLE CHEST - 1 VIEW  COMPARISON:  06/15/2015  FINDINGS: Endotracheal tube has been removed. Feeding tube is seen extending off the inferior edge of the film. No change left IJ line.  Mild cardiac enlargement. Vascular pattern normal. Mild airspace disease in the medial lung bases bilaterally.  IMPRESSION: Mild bibasilar medial airspace disease most consistent with atelectasis.   Electronically Signed   By: Skipper Cliche M.D.   On: 06/18/2015 11:52   Dg Abd Portable 1v  06/18/2015   CLINICAL DATA:  Feeding tube replacement.  EXAM: PORTABLE ABDOMEN - 1 VIEW  COMPARISON:  One-view abdomen 06/17/2015.  FINDINGS:  A single view the abdomen demonstrates the tip the small bore feeding tube in the distal stomach. The bowel gas pattern is otherwise unremarkable. The lung bases are clear.  IMPRESSION: The tip of a small bore feeding tube is in the distal stomach.   Electronically Signed   By: San Morelle M.D.   On: 06/18/2015 07:21   Dg Abd Portable 1v  06/17/2015   CLINICAL DATA:  Enteric tube placement.  EXAM: PORTABLE ABDOMEN - 1 VIEW  COMPARISON:  Earlier 06/17/2015.  FINDINGS: Exam demonstrates an enteric feeding tube with tip over the stomach in the left mid abdomen. Bowel gas pattern is nonobstructive. Remainder of the exam is unchanged.  IMPRESSION: No acute findings.   Enteric tube with tip over the stomach in the left mid abdomen.   Electronically Signed   By: Marin Olp M.D.   On: 06/17/2015 16:42   Dg Abd Portable 1v  06/17/2015   CLINICAL DATA:  Feeding tube placement  EXAM: PORTABLE ABDOMEN - 1 VIEW  COMPARISON:  June 09, 2015  FINDINGS: Feeding tube tip is in the stomach. Bowel gas pattern unremarkable. No obstruction or free air is seen on this supine examination. Lung bases are clear.  IMPRESSION: Feeding tube tip in stomach.  Bowel gas pattern unremarkable.   Electronically Signed   By: Lowella Grip III M.D.   On: 06/17/2015 12:36    EKG: Independently reviewed. pending  Time spent: 66min  Lusine Corlett [facility] Pager [phone]  If 7PM-7AM, please contact night-coverage [email or web address] Password [email or web address] 06/19/2015, 12:12 PM

## 2015-06-19 NOTE — Progress Notes (Signed)
Speech Language Pathology Treatment: Dysphagia  Patient Details Name: Delman Goshorn MRN: [id number] DOB: 30-Nov-1955 Today's Date: 06/19/2015 Time: 0034-9179 SLP Time Calculation (min) (ACUTE ONLY): 16 min  Assessment / Plan / Recommendation Clinical Impression  Pt noted with RR of 26 upon SLP arrival.  SLP provided oral care as pt with mild amount of dried secretions on hard palate and lingua.  RR fluctuated from 26-28 during session but oxygen saturations were in 90's.  Pt is awake but does not follow commands to cough or dry swallow.  He maintains head turned to the right despite SLP attempt to turn.  Pt did respond by nodding yes to need to urinate to sitter and nodded to SLP when informed would follow up Monday.  Recommend continue NPO with adequate oral care due to suspected aspiration of secretions.   RN informed of SLP plan and recommendations.     HPI Other Pertinent Information: Naftuli Dalsanto is an 59 y.o. male with no reported past medical history (hasn't been to doctors in years) presents with acute onset of slurred speech and left sided weakness. LSW 2130 when he noted he was unable to stand or use his left side. EMS called, upon arrival noted BP of 240/140. Code stroke activated. CT head imaging reviewed, shows right sided thalamic/BG ICH. Denies taking any blood thinners. Pt was intubated 7/26-8/2.   Pertinent Vitals Pain Assessment: Faces Faces Pain Scale: No hurt  SLP Plan  MBS (when pt is appropriate)    Recommendations Diet recommendations: NPO Medication Administration: Via alternative means              Oral Care Recommendations: Oral care QID Follow up Recommendations: Skilled Nursing facility Plan: MBS (when pt is appropriate)    GO     Claudie Fisherman, [place] [facility] SLP [phone]

## 2015-06-19 NOTE — Progress Notes (Signed)
OT Cancellation Note  Patient Details Name: Mike Rowe MRN: [id number] DOB: 12-19-1955   Cancelled Treatment:    Reason Eval/Treat Not Completed: Patient at procedure or test/ unavailable  Spoke with RN, pt off unit for CT angiogram of chest with elevated RR and SOB.  Will follow up as able.  Simonne Come 06/19/2015, 3:03 PM

## 2015-06-20 DIAGNOSIS — T17908A Unspecified foreign body in respiratory tract, part unspecified causing other injury, initial encounter: Secondary | ICD-10-CM | POA: Insufficient documentation

## 2015-06-20 DIAGNOSIS — T17998D Other foreign object in respiratory tract, part unspecified causing other injury, subsequent encounter: Secondary | ICD-10-CM

## 2015-06-20 LAB — COMPREHENSIVE METABOLIC PANEL
ALBUMIN: 2.5 g/dL — AB (ref 3.5–5.0)
ALK PHOS: 54 U/L (ref 38–126)
ALT: 290 U/L — ABNORMAL HIGH (ref 17–63)
AST: 173 U/L — ABNORMAL HIGH (ref 15–41)
Anion gap: 10 (ref 5–15)
BUN: 39 mg/dL — ABNORMAL HIGH (ref 6–20)
CO2: 17 mmol/L — AB (ref 22–32)
Calcium: 8.3 mg/dL — ABNORMAL LOW (ref 8.9–10.3)
Chloride: 124 mmol/L — ABNORMAL HIGH (ref 101–111)
Creatinine, Ser: 1.01 mg/dL (ref 0.61–1.24)
GFR calc Af Amer: >60 mL/min (ref >60)
GFR calc non Af Amer: >60 mL/min (ref >60)
Glucose, Bld: 170 mg/dL — ABNORMAL HIGH (ref 65–99)
POTASSIUM: 4.1 mmol/L (ref 3.5–5.1)
SODIUM: 151 mmol/L — AB (ref 135–145)
Total Bilirubin: 0.9 mg/dL (ref 0.3–1.2)
Total Protein: 5.7 g/dL — ABNORMAL LOW (ref 6.5–8.1)

## 2015-06-20 LAB — GLUCOSE, CAPILLARY
GLUCOSE-CAPILLARY: 125 mg/dL — AB (ref 65–99)
GLUCOSE-CAPILLARY: 140 mg/dL — AB (ref 65–99)
Glucose-Capillary: 102 mg/dL — ABNORMAL HIGH (ref 65–99)
Glucose-Capillary: 126 mg/dL — ABNORMAL HIGH (ref 65–99)
Glucose-Capillary: 127 mg/dL — ABNORMAL HIGH (ref 65–99)
Glucose-Capillary: 128 mg/dL — ABNORMAL HIGH (ref 65–99)

## 2015-06-20 LAB — TROPONIN I
Troponin I: 0.08 ng/mL — ABNORMAL HIGH (ref <0.031)
Troponin I: 0.07 ng/mL — ABNORMAL HIGH (ref <0.031)

## 2015-06-20 LAB — CBC
HCT: 39.4 % (ref 39.0–52.0)
HEMOGLOBIN: 12.4 g/dL — AB (ref 13.0–17.0)
MCH: 31.3 pg (ref 26.0–34.0)
MCHC: 31.5 g/dL (ref 30.0–36.0)
MCV: 99.5 fL (ref 78.0–100.0)
PLATELETS: 95 10*3/uL — AB (ref 150–400)
RBC: 3.96 MIL/uL — ABNORMAL LOW (ref 4.22–5.81)
RDW: 13.9 % (ref 11.5–15.5)
WBC: 6.4 10*3/uL (ref 4.0–10.5)

## 2015-06-20 LAB — PROCALCITONIN: Procalcitonin: <0.1 ng/mL

## 2015-06-20 MED ORDER — DEXTROSE 5 % IV SOLN
INTRAVENOUS | Status: DC
  Administered 2015-06-20: 75 mL via INTRAVENOUS
  Administered 2015-06-21 – 2015-06-22 (×2): 1000 mL via INTRAVENOUS

## 2015-06-20 MED ORDER — SODIUM CHLORIDE 0.9 % IV SOLN
3.0000 g | Freq: Four times a day (QID) | INTRAVENOUS | Status: DC
  Administered 2015-06-20 – 2015-06-26 (×25): 3 g via INTRAVENOUS
  Filled 2015-06-20 (×27): qty 3

## 2015-06-20 MED ORDER — IPRATROPIUM-ALBUTEROL 0.5-2.5 (3) MG/3ML IN SOLN
3.0000 mL | Freq: Three times a day (TID) | RESPIRATORY_TRACT | Status: DC
  Administered 2015-06-21 – 2015-06-25 (×11): 3 mL via RESPIRATORY_TRACT
  Filled 2015-06-20 (×13): qty 3

## 2015-06-20 NOTE — Progress Notes (Signed)
Physical Therapy Treatment Patient Details Name: Mike Rowe MRN: [id number] DOB: [birth year] Today's Date: 06/20/2015    History of Present Illness Mike Rowe is an 59 y.o. male with no reported past medical history (hasn't been to doctors in years) presents with acute onset of slurred speech and left sided weakness. LSW 2130 when he noted he was unable to stand or use his left side. EMS called, upon arrival noted BP of 240/140. Code stroke activated. CT head imaging reviewed, shows right sided thalamic/BG ICH. 7/26 Transfer to the ICU for intubation and 3% saline. Extubated 8/2    PT Comments    Patient in bed on arrival, nods agreement to therapy,  Non-verbal, flat affect.  Flaccid L side, R cervical spine lateral flexion with strong tone/resistance to manual neutral positioning.  Patient was able to track eyes to L past midline in supine after treatment, not during seated exercises.  MAX/Dependent assist for neuro re-ed / proprioceptive positioning at edge of bed.  Patient is in acute stages of severely debilitating cerebrovascular incident, will benefit from continued PT services to improve mobility, positioning, and strengthening over extended recovery period.  Follow Up Recommendations  SNF;Supervision/Assistance - 24 hour     Equipment Recommendations   (TBA)    Recommendations for Other Services       Precautions / Restrictions Precautions Precautions: Fall Precaution Comments: can be combative at times Restrictions Weight Bearing Restrictions: No    Mobility  Bed Mobility Overal bed mobility: Needs Assistance;+2 for physical assistance;+ 2 for safety/equipment Bed Mobility: Supine to Sit;Sit to Sidelying;Rolling Rolling: Max assist;Total assist   Supine to sit: Total assist;+2 for physical assistance;HOB elevated Sit to supine: Total assist;+2 for physical assistance;+2 for safety/equipment Sit to sidelying: Max assist;+2 for physical assistance;+2 for  safety/equipment General bed mobility comments: Inconsistent use of R UE, tends to 'push' to L side in sitting  Transfers                 General transfer comment: 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 EOB  Ambulation/Gait                 Stairs            Wheelchair Mobility    Modified Rankin (Stroke Patients Only) Modified Rankin (Stroke Patients Only) Pre-Morbid Rankin Score: No symptoms Modified Rankin: Severe disability     Balance Overall balance assessment: Needs assistance   Sitting balance-Leahy Scale: Zero Sitting balance - Comments: no righting reactions; RUE pushing when in weightbearing (and falling to his left); able to prop on Rt forearm/elbow without pushing, however still max assist                            Cognition Arousal/Alertness: Lethargic Behavior During Therapy: Flat affect Overall Cognitive Status: Difficult to assess         Following Commands: Follows one step commands inconsistently;Follows one step commands with increased time            Exercises General Exercises - Upper Extremity Shoulder Flexion: 10 reps;Supine;AAROM;Both Shoulder ABduction: 5 reps;AAROM;Both;Supine Elbow Flexion: AAROM;Supine;Both;10 reps Elbow Extension: AAROM;Supine;Both;10 reps General Exercises - Lower Extremity Heel Slides: AAROM;10 reps;Supine;Both Hip ABduction/ADduction: AAROM;Supine;Both;10 reps Other Exercises Other Exercises: Neck rigid in R lateral flexion and cervical rotation    General Comments        Pertinent Vitals/Pain Pain Assessment: Faces Faces Pain Scale: No hurt    Home Living  Prior Function            PT Goals (current goals can now be found in the care plan section) Acute Rehab PT Goals Patient Stated Goal: unable PT Goal Formulation: Patient unable to participate in goal setting Time For Goal Achievement: 06/30/15 Potential to Achieve Goals:  Fair    Frequency  Min 3X/week    PT Plan Current plan remains appropriate    Co-evaluation             End of Session Equipment Utilized During Treatment: Oxygen Activity Tolerance: Patient tolerated treatment well Patient left: in bed;with call bell/phone within reach;with restraints reapplied;with SCD's reapplied;with nursing/sitter in room     Time: 1105-1135 PT Time Calculation (min) (ACUTE ONLY): 30 min  Charges:  $Therapeutic Exercise: 8-22 mins $Neuromuscular Re-education: 8-22 mins                    G Codes:      Mike Rowe L 07-04-2015, 1:36 PM

## 2015-06-20 NOTE — Progress Notes (Signed)
STROKE TEAM PROGRESS NOTE   HISTORY Mike Rowe is an 59 y.o. male with no reported past medical history (hasn't been to doctors in years) presents with acute onset of slurred speech and left sided weakness. LSW 2130 when he noted he was unable to stand or use his left side. EMS called, upon arrival noted BP of 240/140. Code stroke activated. CT head imaging reviewed, shows right sided thalamic/BG ICH. Denies taking any blood thinners.  He denies any past medical history including hypertension. Denies any surgical history. Denies alcohol or drug abuse.  Date last known well: 06/05/2015 Time last known well: 2130 tPA Given: no, ICH Modified Rankin: 0 ICH Score: 1  Initial NIHSS of 12   SUBJECTIVE (INTERVAL HISTORY):   The patient makes no attempts to communicate. He appears lethargic. He follows occasional simple commands. No family members are present although there is a Actuary in the room. The patient has a nasogastric feeding tube. His hands are restrained. Will ask speech to reevaluate the patient. Apparently they have requested a barium swallow. The order has been placed. If he fails the swallowing evaluation will need a PEG placed next week.   Temp:  [97.7 F (36.5 C)-100.8 F (38.2 C)] 97.9 F (36.6 C) (08/06 1050) Pulse Rate:  [62-92] 76 (08/06 1050) Cardiac Rhythm:  [-] Normal sinus rhythm (08/05 2000) Resp:  [16-25] 20 (08/06 1050) BP: (99-153)/(47-84) 120/67 mmHg (08/06 1050) SpO2:  [92 %-100 %] 97 % (08/06 1050) Weight:  [68.085 kg (150 lb 1.6 oz)] 68.085 kg (150 lb 1.6 oz) (08/06 0500)   Recent Labs Lab 06/19/15 1619 06/19/15 2017 06/20/15 0003 06/20/15 0356 06/20/15 0906  GLUCAP 134* 146* 127* 125* 140*    Recent Labs Lab 06/14/15 0523  06/16/15 0345 06/17/15 0350 06/18/15 0508 06/19/15 0506 06/20/15 0443  NA 153*  < > 151* 153* 150* 153* 151*  K 3.2*  < > 3.3* 3.5 3.3* 3.4* 4.1  CL 118*  < > 119* 121* 119* 126* 124*  CO2 26  < > 25 23 22  21* 17*  GLUCOSE  150*  < > 163* 114* 132* 135* 170*  BUN 33*  < > 32* 27* 44* 41* 39*  CREATININE 0.91  < > 0.81 0.76 0.94 0.96 1.01  CALCIUM 8.2*  < > 8.4* 8.5* 8.6* 8.6* 8.3*  MG 2.4  --   --   --   --  2.5*  --   PHOS 3.7  --   --   --   --  3.8  --   < > = values in this interval not displayed.  Recent Labs Lab 06/14/15 0523 06/19/15 0506 06/20/15 0443  AST 94* 236* 173*  ALT 100* 346* 290*  ALKPHOS 44 58 54  BILITOT 1.1 0.7 0.9  PROT 5.9* 6.2* 5.7*  ALBUMIN 2.4* 2.7* 2.5*    Recent Labs Lab 06/14/15 0523 06/15/15 0440 06/17/15 0350 06/19/15 0506 06/20/15 0443  WBC 6.9 6.1 11.0* 7.0 6.4  NEUTROABS  --  4.1 8.4*  --   --   HGB 12.4* 11.6* 13.4 13.1 12.4*  HCT 37.6* 35.5* 40.9 40.6 39.4  MCV 98.4 98.6 96.2 97.1 99.5  PLT 89* 90* 115* 115* 95*    Recent Labs Lab 06/19/15 1415 06/19/15 1939  TROPONINI 0.09* 0.09*   No results for input(s): LABPROT, INR in the last 72 hours. No results for input(s): COLORURINE, LABSPEC, [place], GLUCOSEU, HGBUR, BILIRUBINUR, KETONESUR, PROTEINUR, UROBILINOGEN, NITRITE, LEUKOCYTESUR in the last 72 hours.  Invalid  input(s): APPERANCEUR     Component Value Date/Time   CHOL 153 06/16/2015 0345   TRIG 176* 06/16/2015 1035   HDL 16* 06/16/2015 0345   CHOLHDL 9.6 06/16/2015 0345   VLDL 40 06/16/2015 0345   LDLCALC 97 06/16/2015 0345   Lab Results  Component Value Date   HGBA1C 5.2 06/07/2015      Component Value Date/Time   LABOPIA NONE DETECTED 06/06/2015 0100   COCAINSCRNUR POSITIVE* 06/06/2015 0100   LABBENZ NONE DETECTED 06/06/2015 0100   AMPHETMU NONE DETECTED 06/06/2015 0100   THCU NONE DETECTED 06/06/2015 0100   LABBARB NONE DETECTED 06/06/2015 0100    No results for input(s): ETH in the last 168 hours.  Imaging  Ct Head Wo Contrast 06/05/2015    Acute right thalamic hemorrhage.    Ct Head Wo Contrast - ( Pre Fall ) 06/06/2015    Increase in size of right thalamic/posterior right internal capsule hematoma now with maximal  transverse dimension 2.3 x 2.2 cm versus prior 2.1 x 1.6 cm.  Increase surrounding vasogenic edema and local mass effect with mild compression the right lateral ventricle without evidence of midline shift. Interval development of blood within the deep dependent aspect of the right lateral ventricle consistent with breakthrough of the right thalamic hemorrhage into the right lateral ventricle.  Ct Head Wo Contrast - ( Post fall ) 06/06/2015 Right for thalamic hemorrhage with a small amount of intraventricular blood appears unchanged. No new abnormality is seen.  06/15/2015 Essentially no change compared to recent prior study. Hemorrhagic lesion measuring 4.5 x 3.6 arising from the right basal ganglia with surrounding edema, stable. Midline shift of the third ventricle toward the left is stable. Slight ventricular prominence with layering hemorrhage in the occipital horns of the lateral ventricles remain without appreciable change. No change in overall ventricular size and configuration. No new infarct compared to recent prior Study.  06/19/15  Limited assessment due to patient positioning within the CT scanner. Evolving RIGHT thalamic hemorrhage with intraventricular extension, stable moderate hydrocephalus.   CUS - Technically limited due to left IJ line, pt position, and movement. Visualized bilaterally the bifurcation, ICA, and ECA. Appears to be 1-39% ICA stenosis.  2D echo - - Left ventricle: The cavity size was normal. Wall thickness was increased in a pattern of mild LVH. Systolic function was normal. The estimated ejection fraction was in the range of 60% to 65%. Wall motion was normal; there were no regional wall motion abnormalities. Doppler parameters are consistent with abnormal left ventricular relaxation (grade 1 diastolic dysfunction).  Impressions: - No cardiac source of emboli was indentified.  PHYSICAL EXAM   Temp:  [97.7 F (36.5 C)-100.8 F (38.2  C)] 97.9 F (36.6 C) (08/06 1050) Pulse Rate:  [62-92] 76 (08/06 1050) Resp:  [16-25] 20 (08/06 1050) BP: (99-153)/(47-84) 120/67 mmHg (08/06 1050) SpO2:  [92 %-100 %] 97 % (08/06 1050) Weight:  [68.085 kg (150 lb 1.6 oz)] 68.085 kg (150 lb 1.6 oz) (08/06 0500)  General - Well nourished, well developed, intubated and not following commands.  Ophthalmologic - Fundi not visualized due to noncooperation.  Cardiovascular - Regular rate and rhythm.  Neuro -  on nasal cannula, with increased RR, mild respiratory failure.. Pt opens eyes on voice and follows some simple commands, such as wiggle toes and showing fingers. PERRL, right sided gaze preference, doll's eyes present, positive corneal and gag and cough, breathing over the vent. On pain stimulation, left UE and LE withdraw to pain, right  UE and LE spontaneous movement. LUE increased muscle tone. Babinski positive on the left. DTR decreased on the left. Sensation, coordination and gait not tested.   ASSESSMENT [name] is a 59 y.o. male with no significant past medical history no regular medical follow-up presenting with left hemiparesis really elevated blood pressures. He did not receive IV t-PA due to acute right thalamic hemorrhage.   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 Hematoma had interval enlargement due to agitation and high BP not at goal.   Resultant  left hemiparesis, less responsive  MRI  not performed  MRA  not performed   Carotid Doppler  technically difficult, but seems unremarkable.  2D Echo - unremarkable  LDL 97  HgbA1c 5.2  SCDs for VTE prophylaxis Diet NPO time specified  no antithrombotic prior to admission, now on no antithrombotic secondary to hemorrhage  Ongoing aggressive stroke risk factor management  Therapy recommendations: SNF recommended  Disposition:  Pending  Cerebral edema  Off 3% saline now  Na 153->151->153 -> 150 -> 153-> 151  Close  monitoring  Off precedex  Cocaine abuse  Positive on UDS  Hypertension  Home meds:  No antihypertensives medications prior to admission  Unstable, still high  On norvasc, clonidine, hydralazine, labetalol  Increase clonidine to 0.2mg  tid - blood pressure better  Hyperlipidemia  LDL 97  No home meds  Hold off statins due to elevated liver enzymes.  Respiratory - mild acute distress  BNP today 149.3 (H)  CXR - low lung volumes with mild bibasilar atelectasis - stable  Lactic acid 1.6 within normal range  Troponin 0.09 (H) - every 6 hours 3 - mildly elevated 3  CMP - sodium 153- potassium 3.4- chloride- 126 CO2- 21 BUN- 41 creatinine- 0.96 AST- 236 ALT- 346  CBC - WBCs 7.0  Appreciate internal medicine consult Dr. Broadus [name] (possible PE risk noted per Dr. Broadus [name])   Nutrition  Continue NG tube feedings for now  Speech therapy to perform modified barium swallow  If patient fails swallow eval will need PEG next week   Other Stroke Risk Factors  Cigarette smoking  alcohol use  Other Active Problems  Thrombocytopenia  Anxiety / agitation  Mildly elevated BUN   Other Pertinent History  No family contact yet  Hospital day # [place] PA-C [facility] Pager [phone] 06/20/2015, 11:12 AM    I have personally examined this patient, reviewed notes, independently viewed imaging studies, participated in medical decision making and plan of care. I have made any additions or clarifications directly to the above note. Agree with note above. Plan check barium swallow if flunks get PEG tube over next few days and then transfer to SNF for rehab  Antony Contras, [place] Pager: [phone] 06/20/2015 2:53 PM .   Antony Contras, MD   To contact Stroke Continuity provider, please refer to [email or web address] After hours, contact General Neurology

## 2015-06-20 NOTE — Progress Notes (Signed)
PROGRESS NOTE  Mike Rowe KXF:[id number] DOB: 1956-04-21 DOA: 06/05/2015 PCP: No primary care provider on file.  Brief history 59 y.o. male with no reported past medical history (hasn't been to doctors in years) presents with acute onset of slurred speech and left sided weakness. LSW 2130 when he noted he was unable to stand or use his left side. EMS called, upon arrival noted BP of 240/140. Code stroke activated. CT head imaging reviewed, shows right sided thalamic/BG ICH.  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 and is setting of Cocaine use -Management per neurology service -LDL 97 -Hemoglobin A1c 5.2 -Patient has residual left hemiparesis -He is noncommunicative at this time Dysphagia -continue enteral feedings via Panda tube -may ultimately need PEG Hypernatremia -Start hypotonic fluid Hypertension -Continue clonidine, labetalol, hydralazine, and amlodipine Polysubstance abuse -Including cocaine, alcohol, tobacco Transaminasemia -Likely secondary to chronic hepatitis C Hyperlipidemia -Hold statin at this time in the setting of elevated LFTs Elevated troponin -Secondary to demand ischemia -Finish cycling troponins -Doubt ACS -EKG with ST depression insistent with LVH with repolarization abnormalities    Family Communication:   Pt at beside Disposition Plan:    Remain inpt.  Will need SNF when stable       Procedures/Studies: Ct Head Wo Contrast  06/18/2015   CLINICAL DATA:  Follow-up intracranial hemorrhage. History of hypertension, cocaine abuse, alcohol dependence.  EXAM: CT HEAD WITHOUT CONTRAST  TECHNIQUE: Contiguous axial images were obtained from the base of the skull through the vertex without intravenous contrast.  COMPARISON:  CT head June 15, 2015 and CT head June 05, 2015  FINDINGS: Patient is obliqued in the scanner, limiting evaluation.  4.6 x 3.3 cm evolving RIGHT thalamus intraparenchymal hematoma with surrounding low-density vasogenic edema. Intraventricular extension with small amount of dependent blood products in the occipital horns. Partially effaced RIGHT temporal horn, stable moderate hydrocephalus with mild interstitial edema. Local mass effect, limited assessment for midline shift due to patient positioning. No acute large vascular territory infarct.  No abnormal extra-axial fluid collections. Stable density along the falx most consistent with calcification. Basal cisterns appear patent though, decreased by positioning. Moderate calcific atherosclerosis the carotid siphons. No skull fracture. Nasogastric tube in place.  IMPRESSION: Limited assessment due to patient positioning within the CT scanner.  Evolving RIGHT thalamic hemorrhage with intraventricular extension, stable moderate hydrocephalus.   Electronically Signed   By: Elon Alas M.D.   On: 06/18/2015 23:54   Ct Head Wo Contrast  06/15/2015   CLINICAL DATA:  Intracranial hemorrhage  EXAM: CT HEAD WITHOUT CONTRAST  TECHNIQUE: Contiguous axial images were obtained from the base of the skull through the vertex without intravenous contrast.  COMPARISON:  June 12, 2015  FINDINGS: The hemorrhage arising from the right basal ganglia region measures 4.5 x 3.6 cm, essentially unchanged. There is surrounding vasogenic type edema. This hemorrhagic lesion impresses upon the third  ventricle, deviating the third ventricle 8 mm from the midline toward the left. This degree of midline shift of the third ventricle is not appreciably changed.  There is hemorrhage extending into the occipital horn of each lateral ventricle with layering hemorrhage in the occipital horns of the lateral ventricles, not significantly changed. The lateral ventricles remain borderline prominent but stable. Fourth ventricle is in the midline.  There is no new gray-white compartment lesion compared to recent prior study.  Bony calvarium appears intact.  The mastoid air cells are clear.  There is moderate edema inferior to the hemorrhage in the medial right temporal lobe, stable.  IMPRESSION: Essentially no change compared to recent prior study. Hemorrhagic lesion measuring 4.5 x 3.6 arising from the right basal ganglia with surrounding edema, stable. Midline shift of the third ventricle toward the left is stable. Slight ventricular prominence with layering hemorrhage in the occipital horns of the lateral ventricles remain without appreciable change. No change in overall ventricular size and configuration. No new infarct compared to recent prior study.   Electronically Signed   By: Lowella Grip III M.D.   On: 06/15/2015 07:08   Ct Head Wo Contrast  06/12/2015   CLINICAL DATA:  Followup hemorrhagic stroke.  EXAM: CT HEAD WITHOUT CONTRAST  TECHNIQUE: Contiguous axial images were obtained from the base of the skull through the vertex without intravenous contrast.  COMPARISON:  CT head June 09, 2015  FINDINGS: RIGHT thalamic/basal ganglia 4.5 x 3.5 cm dense intraparenchymal hematoma is unchanged in size, surrounding low-density vasogenic edema. Again noted is intraventricular extension with layering blood products in the occipital horn. Local mass effect, partially effaced lateral ventricle without obstructive hydrocephalus.  No acute large vascular territory infarct. Basal cisterns are patent. Mild calcific  atherosclerosis of the carotid siphons.  Ocular globes and orbital contents are nonsuspicious, mild enophthalmos suspected. Visualized paranasal sinuses and mastoid air cells are well aerated.  IMPRESSION: Evolving RIGHT basal ganglia/ thalamic intraparenchymal hematoma with similar intraventricular extension and local mass effect. No hydrocephalus.   Electronically Signed   By: Elon Alas M.D.   On: 06/12/2015 03:00   Ct Head Wo Contrast  06/09/2015   CLINICAL DATA:  Right facial droop. Right intraparenchymal hemorrhage.  EXAM: CT HEAD WITHOUT CONTRAST  TECHNIQUE: Contiguous axial images were obtained from the base of the skull through the vertex without intravenous contrast.  COMPARISON:  CT scan of June 08, 2015.  FINDINGS: Bony calvarium appears intact. The intraparenchymal hemorrhage seen in the right thalamus and basal ganglia on prior exam is significantly enlarged currently, measuring 4.6 x 3.4 cm. There is an increased amount of hemorrhage seen within the right lateral ventricle. Small amount of hemorrhage is also seen in the left posterior horn which is new since prior exam. There appears to be hemorrhage in the third ventricle as well. Slightly increased left lateral ventricular dilatation is noted. There is approximately 6 mm of left-to-right midline shift which is increased compared to prior exam. Fourth ventricle appears normal.  IMPRESSION: Intraparenchymal hemorrhage seen in right thalamus and basal ganglia on prior exam is significantly enlarged, with increased amount of hemorrhage seen within the right lateral ventricle. Small amount of hemorrhage is now noted in the left lateral and third ventricles, with increased dilatation of the left lateral ventricle. These results will be called to the ordering clinician or representative by the Radiologist Assistant, and communication documented in the PACS or zVision Dashboard.   Electronically Signed   By: Marijo Conception, M.D.   On: 06/09/2015  10:08   Ct Head Wo Contrast  06/08/2015   CLINICAL DATA:  Intracranial hemorrhage.  Sudden behavioral change.  EXAM: CT HEAD WITHOUT CONTRAST  TECHNIQUE: Contiguous axial images were obtained from the base of the skull through the vertex without intravenous contrast.  COMPARISON:  06/07/2015 and 06/05/2015  FINDINGS: The right thalamic hemorrhage has increased in size. It currently measures 3.2 x 2.3 cm and previously measured 2.3 x 2.2 cm. There is mildly worsened mass effect on the third ventricle. There is slight increase in volume of intraventricular blood within the right occipital horn.  No other significant interval changes are evident. No extra-axial blood is evident.  IMPRESSION: Increased size of the right thalamic hemorrhage with worsened mass effect on the third ventricle. These results were called by telephone at the time of interpretation on 06/08/2015 at 2:41 am to the patient's nurse, who verbally acknowledged these results.   Electronically Signed   By: Andreas Newport M.D.   On: 06/08/2015 02:41   Ct Head Wo Contrast  06/07/2015   CLINICAL DATA:  59 year old with intracranial hemorrhage. Follow-up. Subsequent encounter.  EXAM: CT HEAD WITHOUT CONTRAST  TECHNIQUE: Contiguous axial images were obtained from the base of the skull through the vertex without intravenous contrast.  COMPARISON:  06/04/2013.  FINDINGS: Increase in size of right thalamic/posterior right internal capsule hematoma now with maximal transverse dimension 2.3 x 2.2 cm versus prior 2.1 x 1.6 cm. Increase surrounding vasogenic edema and local mass effect with mild compression the right lateral ventricle without evidence of midline shift.  Interval development of blood within the deep dependent aspect of the right lateral ventricle consistent with breakthrough of the right thalamic hemorrhage into the right lateral ventricle.  No CT evidence of large acute thrombotic infarct.  No intracranial mass lesion separate from above  described findings.  IMPRESSION: Increase in size of right thalamic/posterior right internal capsule hematoma now with maximal transverse dimension 2.3 x 2.2 cm versus prior 2.1 x 1.6 cm. Increase surrounding vasogenic edema and local mass effect with mild compression the right lateral ventricle without evidence of midline shift.  Interval development of blood within the deep dependent aspect of the right lateral ventricle consistent with breakthrough of the right thalamic hemorrhage into the right lateral ventricle.  These results were called by telephone at the time of interpretation on 06/07/2015 at 7:31 am to [facility], patient's nurse who verbally acknowledged these results.   Electronically Signed   By: Genia Del M.D.   On: 06/07/2015 07:31   Ct Head Wo Contrast  06/07/2015   CLINICAL DATA:  Status post fall last night. Patient diagnosed with intracranial hemorrhage 06/05/2015.  EXAM: CT HEAD WITHOUT CONTRAST  TECHNIQUE: Contiguous axial images were obtained from the base of the skull through the vertex without intravenous contrast.  COMPARISON:  Head CT scan 06/05/2015.  FINDINGS: Hemorrhage in the right thalamus is again seen. There is also a small amount of intraventricular blood. The appearance is unchanged. No new abnormality including new hemorrhage, infarct, mass lesion, midline shift or abnormal extra-axial fluid collection is identified. No hydrocephalus or pneumocephalus. The calvarium is intact.  IMPRESSION: Right for thalamic hemorrhage with a small amount of intraventricular blood appears unchanged. No new abnormality is seen.   Electronically Signed   By: Inge Rise M.D.   On: 06/07/2015 07:27   Ct Head Wo Contrast  06/05/2015   CLINICAL DATA:  59 year old male with left-sided weakness. Code stroke.  EXAM: CT HEAD WITHOUT CONTRAST  TECHNIQUE: Contiguous axial images were obtained from the base of the skull through the vertex without intravenous contrast.  COMPARISON:  None.  FINDINGS:  There is a 2.1 x 1.6 cm acute intraparenchymal hemorrhage centered at the right thalamus. There is mild adjacent edema. No significant mass effect. No midline shift.  The ventricles and the sulci are appropriate in size for the patient's age. The The gray-white matter differentiation is preserved.  The visualized paranasal sinuses and mastoid air cells are well aerated. The calvarium is intact.  IMPRESSION: Acute right thalamic hemorrhage.  Critical Value/emergent results were called by telephone at the time of interpretation on 06/05/2015 at 10:39 pm to Dr. Janann Colonel , who verbally acknowledged these results.   Electronically Signed   By: Anner Crete M.D.   On: 06/05/2015 22:41   Ct Angio Chest Pe W/cm &/or Wo Cm  06/19/2015   CLINICAL DATA:  Shortness of Breath  EXAM: CT ANGIOGRAPHY CHEST WITH CONTRAST  TECHNIQUE: Multidetector CT imaging of the chest was performed using the standard protocol during bolus administration of intravenous contrast. Multiplanar CT image reconstructions and MIPs were obtained to evaluate the vascular anatomy.  CONTRAST:  57mL OMNIPAQUE IOHEXOL 350 MG/ML SOLN  COMPARISON:  Chest radiograph June 19, 2015  FINDINGS: There is no demonstrable pulmonary embolus. There is atherosclerotic change in the aortic arch region. There is no thoracic aortic aneurysm or dissection. The visualized great vessels appear unremarkable.  There is patchy bibasilar atelectatic change. There is no frank edema or consolidation. There is a small bulla in the right upper lobe peripherally near the apex.  On axial slice 37 series 6, there is a 7 mm nodular opacity along the minor fissure laterally on the right.  Thyroid appears normal. There is no appreciable thoracic adenopathy.  There are foci of coronary artery calcification. There is left ventricular hypertrophy. The pericardium is not thickened.  In the visualized upper abdomen, there are several scattered subcentimeter liver cysts. The liver contour  appears rather nodular. There is a feeding tube extending into the stomach ; tip is not seen an is below the level of imaging.  There are no blastic or lytic bone lesions.  Review of the MIP images confirms the above findings.  IMPRESSION: No demonstrable pulmonary embolus.  Patchy bibasilar atelectasis.  No frank edema or consolidation.  7 mm nodular opacity along the minor fissure on the right laterally. Followup of this nodular opacity should be based on Fleischner Society guidelines. If the patient is at high risk for bronchogenic carcinoma, follow-up chest CT at 3-17months is recommended. If the patient is at low risk for bronchogenic carcinoma, follow-up chest CT at 6-12 months is recommended. This recommendation follows the consensus statement: Guidelines for Management of Small Pulmonary Nodules Detected on CT Scans: A Statement from the [place] as published in Radiology 2005; 237:395-400.  No appreciable adenopathy.  Foci of coronary artery calcification. There is left ventricular hypertrophy.  Liver contour appears rather nodular. Question a degree of underlying hepatic cirrhosis.   Electronically Signed   By: Lowella Grip III M.D.   On: 06/19/2015 15:30   Dg Chest Port 1 View  06/19/2015   CLINICAL DATA:  Respiratory distress.  EXAM: PORTABLE CHEST - 1 VIEW  COMPARISON:  One-view chest 06/18/2015  FINDINGS: The heart size is exaggerated by low lung volumes. Mild pulmonary vascular congestion is again noted. Minimal right basilar atelectasis is present. The left IJ line has been removed. There is no pneumothorax. A feeding tube courses off the inferior border of the film.  IMPRESSION: 1. Persistent low lung volumes exaggerating the heart size. 2. Mild bibasilar  atelectasis is stable. 3. Interval removal of left IJ line without complication.   Electronically Signed   By: San Morelle M.D.   On: 06/19/2015 11:44   Dg Chest Port 1 View  06/18/2015   CLINICAL DATA:  Subsequent  evaluation airway aspiration  EXAM: PORTABLE CHEST - 1 VIEW  COMPARISON:  06/15/2015  FINDINGS: Endotracheal tube has been removed. Feeding tube is seen extending off the inferior edge of the film. No change left IJ line.  Mild cardiac enlargement. Vascular pattern normal. Mild airspace disease in the medial lung bases bilaterally.  IMPRESSION: Mild bibasilar medial airspace disease most consistent with atelectasis.   Electronically Signed   By: Skipper Cliche M.D.   On: 06/18/2015 11:52   Dg Chest Port 1 View  06/15/2015   CLINICAL DATA:  Acute respiratory failure, hypoxia, intercerebral hemorrhage, alcoholic dependence and withdrawal  EXAM: PORTABLE CHEST - 1 VIEW  COMPARISON:  Chest x-ray of June 14, 2015  FINDINGS: The lungs are well-expanded and clear. The heart and pulmonary vascularity are normal. The mediastinum is normal in width. The endotracheal tube tip lies 4.7 cm above the carina. The esophagogastric tube tip projects below the inferior margin of the image. The left internal jugular venous catheter tip projects over the midportion of the SVC. The bony thorax is unremarkable.  IMPRESSION: There is no active cardiopulmonary disease.   Electronically Signed   By: Dayra Rapley  [place] M.D.   On: 06/15/2015 07:35   Dg Chest Port 1 View  06/14/2015   CLINICAL DATA:  Acute respiratory failure with hypoxia  EXAM: PORTABLE CHEST - 1 VIEW  COMPARISON:  Radiograph 06/12/2015  FINDINGS: Endotracheal tube and NG tube are unchanged. LEFT central venous line unchanged. Normal cardiac silhouette. No pulmonary edema. No infiltrate. No pneumothorax.  IMPRESSION: 1. Stable support apparatus. 2. No interval change.   Electronically Signed   By: Suzy Bouchard M.D.   On: 06/14/2015 09:49   Dg Chest Port 1 View  06/12/2015   CLINICAL DATA:  Intubated patient, history of intracranial hemorrhage, alcohol withdrawal, acute respiratory failure and hypoxia.  EXAM: PORTABLE CHEST - 1 VIEW  COMPARISON:  Portable chest x-ray of  June 11, 2015  FINDINGS: The right lung is adequately inflated and clear. On the left the retrocardiac region remains dense. There remains partial obscuration of the hemidiaphragm. The heart is normal in size. The pulmonary vascularity is not engorged. The endotracheal tube tip lies 3.8 cm above the carina. The esophagogastric tube tip projects below the inferior margin of the image. The left internal jugular venous catheter tip projects over the midportion of the SVC.  IMPRESSION: Stable appearance of the chest since yesterday's study. There is persistent left lower lobe atelectasis. The support tubes are in reasonable position.   Electronically Signed   By: Elara Cocke  [place] M.D.   On: 06/12/2015 07:56   Dg Chest Port 1 View  06/11/2015   CLINICAL DATA:  Atelectasis described on previous chest x-ray report. Clinical data from recent chest x-ray indicating acute respiratory failure with hypoxia and alcohol withdrawal.  EXAM: PORTABLE CHEST - 1 VIEW  COMPARISON:  Chest x-ray dated 06/10/2015.  FINDINGS: Lungs are again hyperexpanded suggesting COPD. Dense opacity at the left lung base is unchanged. Lungs otherwise clear. No pleural effusion visualized. No pneumothorax.  Endotracheal tube remains well positioned with tip just above the level of the carina. Nasogastric tube passes below the diaphragm. A left-sided internal jugular central line appears adequately positioned with tip in the  superior vena cava.  IMPRESSION: 1. Dense opacity at the left lung base without significant interval change. This is compatible with previous chest x-ray report description of atelectasis. If febrile, pneumonia could not be completely excluded. 2. Lungs again hyperexpanded suggesting some degree of COPD. 3. No new lung findings. 4. Endotracheal tube and nasogastric tube appear well positioned.   Electronically Signed   By: Franki Cabot M.D.   On: 06/11/2015 08:10   Dg Chest Port 1 View  06/10/2015   CLINICAL DATA:  Intubated  patient, acute respiratory failure with hypoxia, alcohol withdrawal  EXAM: PORTABLE CHEST - 1 VIEW  COMPARISON:  Portable chest x-ray of June 08, 2014  FINDINGS: The lungs remain hyperinflated. There remain coarse infrahilar lung markings bilaterally consistent with subsegmental atelectasis. There is no pneumothorax or significant pleural effusion. The cardiac silhouette is normal in size. The pulmonary vascularity is not engorged.  The endotracheal tube tip lies 4 cm above the carina. The esophagogastric tube tip projects below the inferior margin of the image. The left internal jugular venous catheter tip projects over the midportion of the SVC  IMPRESSION: 1. COPD. Improving left lower lobe atelectasis. There is no CHF or pneumonia. 2. The support tubes are in reasonable position.   Electronically Signed   By: Georgia Baria  [place] M.D.   On: 06/10/2015 08:02   Dg Chest Port 1 View  06/09/2015   CLINICAL DATA:  Hypoxia  EXAM: PORTABLE CHEST - 1 VIEW  COMPARISON:  June 08, 2015  FINDINGS: Endotracheal tube tip is 5.2 cm above the carina. Central catheter tip is in the superior vena cava. No pneumothorax. There is consolidation in the left lower lobe. Small left effusion. The lungs are otherwise clear. Heart size and pulmonary vascularity are normal. No adenopathy. No bone lesions.  IMPRESSION: Tube and catheter positions as described without pneumothorax. Consolidation left lower lobe with small left effusion.   Electronically Signed   By: Lowella Grip III M.D.   On: 06/09/2015 13:29   Dg Chest Port 1 View  06/08/2015   CLINICAL DATA:  Tobacco abuse.  Intracranial hemorrhage.  EXAM: PORTABLE CHEST - 1 VIEW  COMPARISON:  None.  FINDINGS: The heart size is exaggerated by low lung volumes. Mild pulmonary vascular congestion is evident. No focal airspace disease is present. The radiopaque line is noted over the left neck. I spoke with the patient's nurse. The patient does not have intravenous ine in the left neck.  This may be a peripheral IV line projecting over the patient. The visualized soft tissues and bony thorax are unremarkable otherwise.  IMPRESSION: Low lung volumes and mild pulmonary vascular congestion.   Electronically Signed   By: San Morelle M.D.   On: 06/08/2015 07:47   Dg Abd Portable 1v  06/18/2015   CLINICAL DATA:  Feeding tube replacement.  EXAM: PORTABLE ABDOMEN - 1 VIEW  COMPARISON:  One-view abdomen 06/17/2015.  FINDINGS: A single view the abdomen demonstrates the tip the small bore feeding tube in the distal stomach. The bowel gas pattern is otherwise unremarkable. The lung bases are clear.  IMPRESSION: The tip of a small bore feeding tube is in the distal stomach.   Electronically Signed   By: San Morelle M.D.   On: 06/18/2015 07:21   Dg Abd Portable 1v  06/17/2015   CLINICAL DATA:  Enteric tube placement.  EXAM: PORTABLE ABDOMEN - 1 VIEW  COMPARISON:  Earlier 06/17/2015.  FINDINGS: Exam demonstrates an enteric feeding tube with tip over  the stomach in the left mid abdomen. Bowel gas pattern is nonobstructive. Remainder of the exam is unchanged.  IMPRESSION: No acute findings.  Enteric tube with tip over the stomach in the left mid abdomen.   Electronically Signed   By: Marin Olp M.D.   On: 06/17/2015 16:42   Dg Abd Portable 1v  06/17/2015   CLINICAL DATA:  Feeding tube placement  EXAM: PORTABLE ABDOMEN - 1 VIEW  COMPARISON:  June 09, 2015  FINDINGS: Feeding tube tip is in the stomach. Bowel gas pattern unremarkable. No obstruction or free air is seen on this supine examination. Lung bases are clear.  IMPRESSION: Feeding tube tip in stomach.  Bowel gas pattern unremarkable.   Electronically Signed   By: Lowella Grip III M.D.   On: 06/17/2015 12:36   Dg Abd Portable 1v  06/09/2015   CLINICAL DATA:  Orogastric tube placement  EXAM: PORTABLE ABDOMEN - 1 VIEW  COMPARISON:  None.  FINDINGS: Orogastric tube tip is in the proximal stomach. The side port is at the  gastroesophageal junction. There is contrast in the distal half of the colon. There is no bowel dilatation or air-fluid level suggesting obstruction. No free air is seen on this supine examination.  IMPRESSION: Orogastric tube tip in proximal stomach. Side-port is at the gastroesophageal junction. Advise advancing tube approximately 6 cm to confirm that tube tip and side port are both well within the stomach. Overall bowel gas pattern unremarkable.   Electronically Signed   By: Lowella Grip III M.D.   On: 06/09/2015 16:34   Dg Swallowing Func-speech Pathology  06/06/2015    Objective Swallowing Evaluation: Modified Barium Swallow Study    Patient Details  Name: Santino Kinsella MRN: [id number] Date of Birth: 11-27-1955  Today's Date: 06/06/2015 Time: SLP Start Time (ACUTE ONLY): 1245-SLP Stop Time (ACUTE ONLY): 1306 SLP Time Calculation (min) (ACUTE ONLY): 21 min  Past Medical History: No past medical history on file. Past Surgical History: No past surgical history on file. HPI:  Other Pertinent Information: Drayton Tieu is an 59 y.o. male with no reported  past medical history (hasn't been to doctors in years) presents with acute  onset of slurred speech and left sided weakness. LSW 2130 when he noted he  was unable to stand or use his left side. EMS called, upon arrival noted  BP of 240/140. Code stroke activated. CT head imaging reviewed, shows  right sided thalamic/BG ICH. Denies taking any blood thinners  No Data Recorded  Assessment / Plan / Recommendation CHL IP CLINICAL IMPRESSIONS 06/06/2015  Therapy Diagnosis Mild to Moderate pharyngeal phase dysphagia;Moderate  oral phase dysphagia;  Clinical Impression Pt exhibiting moderate oral pharyngeal dysphagia which  is exacerbated by his impulsivity and agitation. Silent aspiration noted  when patient rapidly consumed multiple consecutive cup sips of thin  liquids. Trace aspirates remained below true vocal cords despite cueing  for voilitional cough. No penetration or  aspiration noted in further  trials of thin liquid by cup and straw when sip size and rate where better  controlled. Pts poor condition of dentition along with residual  left  sided decreased lingual and labial sensation, decreased left sided   Lingual and labial range of motion prohibit effective mastication of solid  POs. Recommend dysphagia 2 diet and thin liquids with strict aspiration  precautions. Medicines whole with thin liquid. Full supervision  recommended for adequate carry over of strategies. ST follow up warranted.  CHL IP TREATMENT RECOMMENDATION 06/06/2015  Treatment Recommendations Therapy as outlined in treatment plan below     CHL IP DIET RECOMMENDATION 06/06/2015  SLP Diet Recommendations Dysphagia 2 (Fine chop);Thin  Liquid Administration via (None)  Medication Administration Whole meds with liquid  Compensations Minimize environmental distractions;Slow rate;Small  sips/bites;Check for pocketing  Postural Changes and/or Swallow Maneuvers (None)     CHL IP OTHER RECOMMENDATIONS 06/06/2015  Recommended Consults (None)  Oral Care Recommendations Oral care BID  Other Recommendations (None)     No flowsheet data found.   CHL IP FREQUENCY AND DURATION 06/06/2015  Speech Therapy Frequency (ACUTE ONLY) min 2x/week  Treatment Duration 2 weeks     Pertinent Vitals/Pain        Arvil Chaco MA, CCC-SLP Acute Care Speech Language Pathologist       Levi Aland 06/06/2015, 1:44 PM          Subjective: Patient opens eyes and intermittently grunts. Otherwise does not follow commands or answer questions. No reports of respiratory distress, vomiting, diarrhea, uncontrolled pain.   Objective: Filed Vitals:   06/20/15 0132 06/20/15 0500 06/20/15 0902 06/20/15 0942  BP: 102/58 125/55  153/84  Pulse: 63 73  77  Temp:  97.7 F (36.5 C)    TempSrc:  Oral    Resp: 18 16    Height:      Weight:  68.085 kg (150 lb 1.6 oz)    SpO2: 100% 96% 98%     Intake/Output Summary (Last 24 hours) at  06/20/15 1023 Last data filed at 06/19/15 1829  Gross per 24 hour  Intake   1085 ml  Output    725 ml  Net    360 ml   Weight change: 3.039 kg (6 lb 11.2 oz) Exam:   General:  Pt is alert, follows commands appropriately, not in acute distress  HEENT: No icterus, No thrush, No neck mass, [place]/AT  Cardiovascular: RRR, S1/S2, no rubs, no gallops  Respiratory: CTA bilaterally, no wheezing, no crackles, no rhonchi  Abdomen: Soft/+BS, non tender, non distended, no guarding  Extremities: No edema, No lymphangitis, No petechiae, No rashes, no synovitis  Data Reviewed: Basic Metabolic Panel:  Recent Labs Lab 06/14/15 0523  06/16/15 0345 06/17/15 0350 06/18/15 0508 06/19/15 0506 06/20/15 0443  NA 153*  < > 151* 153* 150* 153* 151*  K 3.2*  < > 3.3* 3.5 3.3* 3.4* 4.1  CL 118*  < > 119* 121* 119* 126* 124*  CO2 26  < > 25 23 22  21* 17*  GLUCOSE 150*  < > 163* 114* 132* 135* 170*  BUN 33*  < > 32* 27* 44* 41* 39*  CREATININE 0.91  < > 0.81 0.76 0.94 0.96 1.01  CALCIUM 8.2*  < > 8.4* 8.5* 8.6* 8.6* 8.3*  MG 2.4  --   --   --   --  2.5*  --   PHOS 3.7  --   --   --   --  3.8  --   < > = values in this interval not displayed. Liver Function Tests:  Recent Labs Lab 06/14/15 0523 06/19/15 0506 06/20/15 0443  AST 94* 236* 173*  ALT 100* 346* 290*  ALKPHOS 44 58 54  BILITOT 1.1 0.7 0.9  PROT 5.9* 6.2* 5.7*  ALBUMIN 2.4* 2.7* 2.5*   No results for input(s): LIPASE, AMYLASE in the last 168 hours. No results for input(s): AMMONIA in the last 168 hours. CBC:  Recent Labs Lab 06/14/15  7867 06/15/15 0440 06/17/15 0350 06/19/15 0506 06/20/15 0443  WBC 6.9 6.1 11.0* 7.0 6.4  NEUTROABS  --  4.1 8.4*  --   --   HGB 12.4* 11.6* 13.4 13.1 12.4*  HCT 37.6* 35.5* 40.9 40.6 39.4  MCV 98.4 98.6 96.2 97.1 99.5  PLT 89* 90* 115* 115* 95*   Cardiac Enzymes:  Recent Labs Lab 06/19/15 1415 06/19/15 1939  TROPONINI 0.09* 0.09*   BNP: Invalid input(s): POCBNP CBG:  Recent  Labs Lab 06/19/15 1619 06/19/15 2017 06/20/15 0003 06/20/15 0356 06/20/15 0906  GLUCAP 134* 146* 127* 125* 140*    Recent Results (from the past 240 hour(s))  Culture, blood (routine x 2)     Status: None   Collection Time: 06/11/15  5:03 PM  Result Value Ref Range Status   Specimen Description BLOOD LEFT HAND  Final   Special Requests BOTTLES DRAWN AEROBIC AND ANAEROBIC 10CC  Final   Culture NO GROWTH 5 DAYS  Final   Report Status 06/16/2015 FINAL  Final  Culture, blood (routine x 2)     Status: None   Collection Time: 06/11/15  5:05 PM  Result Value Ref Range Status   Specimen Description BLOOD RIGHT ARM  Final   Special Requests BOTTLES DRAWN AEROBIC AND ANAEROBIC 6CC  Final   Culture NO GROWTH 5 DAYS  Final   Report Status 06/16/2015 FINAL  Final  Culture, respiratory (NON-Expectorated)     Status: None   Collection Time: 06/11/15  5:18 PM  Result Value Ref Range Status   Specimen Description TRACHEAL ASPIRATE  Final   Special Requests NONE  Final   Gram Stain   Final    ABUNDANT WBC PRESENT, PREDOMINANTLY PMN RARE SQUAMOUS EPITHELIAL CELLS PRESENT MODERATE GRAM NEGATIVE RODS MODERATE GRAM POSITIVE COCCI IN PAIRS    Culture   Final    MODERATE HAEMOPHILUS INFLUENZAE Note: BETA LACTAMASE NEGATIVE MODERATE STREPTOCOCCUS,BETA HEMOLYTIC NOT GROUP A Performed at [facility]    Report Status 06/14/2015 FINAL  Final  Culture, Urine     Status: None   Collection Time: 06/11/15  6:29 PM  Result Value Ref Range Status   Specimen Description URINE, CATHETERIZED  Final   Special Requests PATIENT ON FOLLOWING VANC AND ZOSYN  Final   Culture NO GROWTH 2 DAYS  Final   Report Status 06/13/2015 FINAL  Final     Scheduled Meds: . amLODipine  10 mg Oral Daily  . antiseptic oral rinse  7 mL Mouth Rinse q12n4p  . cefTAZidime (FORTAZ)  IV  1 g Intravenous 3 times per day  . chlorhexidine  15 mL Mouth Rinse BID  . cloNIDine  0.2 mg Oral TID  . feeding supplement  (PRO-STAT SUGAR FREE 64)  30 mL Per Tube Daily  . folic acid  1 mg Oral Daily  . free water  300 mL Per Tube QID  . heparin subcutaneous  5,000 Units Subcutaneous 3 times per day  . hydrALAZINE  100 mg Per Tube 3 times per day  . insulin aspart  2-6 Units Subcutaneous 6 times per day  . ipratropium-albuterol  3 mL Nebulization Q6H  . labetalol  300 mg Oral TID  . multivitamin  5 mL Per Tube Daily  . potassium chloride  20 mEq Per Tube BID  . senna-docusate  1 tablet Oral BID  . thiamine  100 mg Oral Daily  . vancomycin  750 mg Intravenous Q12H   Continuous Infusions: . feeding supplement (JEVITY 1.5 CAL/FIBER)  1,000 mL (06/19/15 1846)     Tylon Kemmerling, DO  [facility] Pager [phone]  If 7PM-7AM, please contact night-coverage [email or web address] Password [email or web address] 06/20/2015, 10:23 AM   LOS: 15 days

## 2015-06-20 NOTE — Evaluation (Signed)
Speech Language Pathology Evaluation Patient Details Name: Mike Rowe MRN: [id number] DOB: Jul 18, 1956 Today's Date: 06/20/2015 Time: 0174-9449 SLP Time Calculation (min) (ACUTE ONLY): 19 min  Problem List:  Patient Active Problem List   Diagnosis Date Noted  . Aspiration into airway   . Encounter for feeding tube placement   . Stroke due to intracerebral hemorrhage   . Tobacco abuse   . Cocaine abuse   . HTN (hypertension), malignant   . Elevated liver enzymes   . Nontraumatic subcortical hemorrhage of cerebral hemisphere   . Altered level of consciousness   . Atelectasis   . Acute respiratory failure with hypoxia   . Alcohol dependence with uncomplicated withdrawal   . ICH (intracerebral hemorrhage) 06/05/2015   Past Medical History: History reviewed. No pertinent past medical history. Past Surgical History: History reviewed. No pertinent past surgical history. HPI:  Rayland Hamed is an 59 y.o. male with no reported past medical history (hasn't been to doctors in years) presents with acute onset of slurred speech and left sided weakness. LSW 2130 when he noted he was unable to stand or use his left side. EMS called, upon arrival noted BP of 240/140. Code stroke activated. CT head imaging reviewed, shows right sided thalamic/BG ICH. Denies taking any blood thinners. Pt was intubated 7/26-8/2.   Assessment / Plan / Recommendation Clinical Impression  Pt currently demonstrating severe cognitive impairments; because of this, unable to assess speech/ language skills. Pt awake and looking at therapist; however, unresponsive. RN reported pt was nodding/ shaking head to answer yes/ no questions and following 1-step commands this a.m. However, at this time, pt was nonresponsive despite max cues/ encouragement. Not following 1-step commands. Provided oral care with suction; pt did appear to swallow several times during this. Opened mouth slightly after multiple verbal/ visual/ tactile cues. Attempted  to provide ice chip; however, pt had no response to it- did not open mouth to accept. Will continue to follow for diagnostic treatment of cognition and communication. Pt continues to not show readiness for MBS d/t cognitive status at this time.    SLP Assessment  Patient needs continued Speech Lanaguage Pathology Services    Follow Up Recommendations  Skilled Nursing facility    Frequency and Duration min 3x week  2 weeks   Pertinent Vitals/Pain Pain Assessment: Faces Faces Pain Scale: No hurt   SLP Goals  Potential to Achieve Goals (ACUTE ONLY): Fair Potential Considerations (ACUTE ONLY): Severity of impairments  SLP Evaluation Prior Functioning  Cognitive/Linguistic Baseline: Information not available   Cognition  Overall Cognitive Status: Impaired/Different from baseline Arousal/Alertness: Lethargic Orientation Level: Disoriented X4 (not responding) Attention: Focused Focused Attention: Impaired Focused Attention Impairment: Verbal basic    Comprehension  Auditory Comprehension Overall Auditory Comprehension: Impaired Yes/No Questions: Impaired (not responding) Basic Biographical Questions: 0-25% accurate Commands: Impaired One Step Basic Commands: 0-24% accurate    Expression Verbal Expression Overall Verbal Expression: Impaired Level of Generative/Spontaneous Verbalization:  (nonverbal)   Oral / Motor Oral Motor/Sensory Function Overall Oral Motor/Sensory Function: Impaired Labial ROM: Reduced left Labial Symmetry: Abnormal symmetry left Labial Strength: Reduced Labial Sensation: Reduced Motor Speech Overall Motor Speech: Mike (comment) (unable to assess- nonverbal)   GO     Kern Reap, MA, CCC-SLP 06/20/2015, 3:35 PM [phone]

## 2015-06-21 LAB — GLUCOSE, CAPILLARY
GLUCOSE-CAPILLARY: 132 mg/dL — AB (ref 65–99)
GLUCOSE-CAPILLARY: 135 mg/dL — AB (ref 65–99)
GLUCOSE-CAPILLARY: 144 mg/dL — AB (ref 65–99)
GLUCOSE-CAPILLARY: 146 mg/dL — AB (ref 65–99)
Glucose-Capillary: 147 mg/dL — ABNORMAL HIGH (ref 65–99)
Glucose-Capillary: 146 mg/dL — ABNORMAL HIGH (ref 65–99)

## 2015-06-21 LAB — BASIC METABOLIC PANEL
Anion gap: 7 (ref 5–15)
BUN: 31 mg/dL — ABNORMAL HIGH (ref 6–20)
CO2: 24 mmol/L (ref 22–32)
Calcium: 8.4 mg/dL — ABNORMAL LOW (ref 8.9–10.3)
Chloride: 116 mmol/L — ABNORMAL HIGH (ref 101–111)
Creatinine, Ser: 0.92 mg/dL (ref 0.61–1.24)
GFR calc Af Amer: >60 mL/min (ref >60)
GFR calc non Af Amer: >60 mL/min (ref >60)
Glucose, Bld: 123 mg/dL — ABNORMAL HIGH (ref 65–99)
POTASSIUM: 4.1 mmol/L (ref 3.5–5.1)
SODIUM: 147 mmol/L — AB (ref 135–145)

## 2015-06-21 LAB — MAGNESIUM: Magnesium: 2.2 mg/dL (ref 1.7–2.4)

## 2015-06-21 LAB — CBC
HEMATOCRIT: 37.8 % — AB (ref 39.0–52.0)
Hemoglobin: 12 g/dL — ABNORMAL LOW (ref 13.0–17.0)
MCH: 31.5 pg (ref 26.0–34.0)
MCHC: 31.7 g/dL (ref 30.0–36.0)
MCV: 99.2 fL (ref 78.0–100.0)
Platelets: 90 10*3/uL — ABNORMAL LOW (ref 150–400)
RBC: 3.81 MIL/uL — AB (ref 4.22–5.81)
RDW: 13.6 % (ref 11.5–15.5)
WBC: 7.6 10*3/uL (ref 4.0–10.5)

## 2015-06-21 NOTE — Progress Notes (Signed)
PROGRESS NOTE  Mike Rowe OMB:[id number] DOB: Jun 01, 1956 DOA: 06/05/2015 PCP: No primary care provider on file.  Brief history 59 y.o. male with no reported past medical history (hasn't been to doctors in years) presents with acute onset of slurred speech and left sided weakness. LSW 2130 when he noted he was unable to stand or use his left side. EMS called, upon arrival noted BP of 240/140. Code stroke activated. CT head imaging reviewed, shows right sided thalamic/BG ICH.  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 and in setting of Cocaine use -Management per neurology service -LDL 97 -Hemoglobin A1c 5.2 -Patient has residual left hemiparesis -He is noncommunicative at this time Dysphagia -continue enteral feedings via Panda tube -Doubt he will be able to undergo modified barium swallow given his mental status -may ultimately need PEG Hypernatremia -Start hypotonic fluid-->improving Hypertension -Continue clonidine, labetalol, hydralazine, and amlodipine Polysubstance abuse -Including cocaine, alcohol, tobacco Transaminasemia -Likely secondary to chronic hepatitis C--positive qualitative RNA Hyperlipidemia -Hold statin at this time in the setting of elevated LFTs Elevated troponin -Secondary to demand ischemia -Finish cycling troponins -Doubt ACS -EKG with ST  depression insistent with LVH with repolarization abnormalities    Family Communication: Pt at beside Disposition Plan: Remain inpt. Will need SNF when stable      Procedures/Studies: Ct Head Wo Contrast  06/18/2015   CLINICAL DATA:  Follow-up intracranial hemorrhage. History of hypertension, cocaine abuse, alcohol dependence.  EXAM: CT HEAD WITHOUT CONTRAST  TECHNIQUE: Contiguous axial images were obtained from the base of the skull through the vertex without intravenous contrast.  COMPARISON:  CT head June 15, 2015 and CT head June 05, 2015  FINDINGS: Patient is obliqued in the scanner, limiting evaluation.  4.6 x 3.3 cm evolving RIGHT thalamus intraparenchymal hematoma with surrounding low-density vasogenic edema. Intraventricular extension with small amount of dependent blood products in the occipital horns. Partially effaced RIGHT temporal horn, stable moderate hydrocephalus with mild interstitial edema. Local mass effect, limited assessment for midline shift due to patient positioning. No acute large vascular territory infarct.  No abnormal extra-axial fluid collections. Stable density along the falx most consistent with calcification. Basal cisterns appear patent though, decreased by positioning. Moderate calcific atherosclerosis the carotid siphons. No skull fracture. Nasogastric tube in place.  IMPRESSION: Limited assessment due to patient positioning within the CT scanner.  Evolving RIGHT thalamic hemorrhage with intraventricular extension, stable moderate hydrocephalus.   Electronically Signed   By: Elon Alas M.D.   On: 06/18/2015 23:54   Ct Head Wo Contrast  06/15/2015   CLINICAL DATA:  Intracranial hemorrhage  EXAM: CT HEAD WITHOUT CONTRAST  TECHNIQUE: Contiguous axial images were obtained from the base of the skull through the vertex without intravenous contrast.  COMPARISON:  June 12, 2015  FINDINGS: The hemorrhage arising from the right basal ganglia region measures 4.5 x  3.6 cm, essentially unchanged. There is surrounding vasogenic type edema. This hemorrhagic lesion impresses upon the third ventricle, deviating the third ventricle 8 mm from the midline toward the left. This degree of  midline shift of the third ventricle is not appreciably changed. There is hemorrhage extending into the occipital horn of each lateral ventricle with layering hemorrhage in the occipital horns of the lateral ventricles, not significantly changed. The lateral ventricles remain borderline prominent but stable. Fourth ventricle is in the midline.  There is no new gray-white compartment lesion compared to recent prior study.  Bony calvarium appears intact.  The mastoid air cells are clear.  There is moderate edema inferior to the hemorrhage in the medial right temporal lobe, stable.  IMPRESSION: Essentially no change compared to recent prior study. Hemorrhagic lesion measuring 4.5 x 3.6 arising from the right basal ganglia with surrounding edema, stable. Midline shift of the third ventricle toward the left is stable. Slight ventricular prominence with layering hemorrhage in the occipital horns of the lateral ventricles remain without appreciable change. No change in overall ventricular size and configuration. No new infarct compared to recent prior study.   Electronically Signed   By: Lowella Grip III M.D.   On: 06/15/2015 07:08   Ct Head Wo Contrast  06/12/2015   CLINICAL DATA:  Followup hemorrhagic stroke.  EXAM: CT HEAD WITHOUT CONTRAST  TECHNIQUE: Contiguous axial images were obtained from the base of the skull through the vertex without intravenous contrast.  COMPARISON:  CT head June 09, 2015  FINDINGS: RIGHT thalamic/basal ganglia 4.5 x 3.5 cm dense intraparenchymal hematoma is unchanged in size, surrounding low-density vasogenic edema. Again noted is intraventricular extension with layering blood products in the occipital horn. Local mass effect, partially effaced lateral ventricle without  obstructive hydrocephalus.  No acute large vascular territory infarct. Basal cisterns are patent. Mild calcific atherosclerosis of the carotid siphons.  Ocular globes and orbital contents are nonsuspicious, mild enophthalmos suspected. Visualized paranasal sinuses and mastoid air cells are well aerated.  IMPRESSION: Evolving RIGHT basal ganglia/ thalamic intraparenchymal hematoma with similar intraventricular extension and local mass effect. No hydrocephalus.   Electronically Signed   By: Elon Alas M.D.   On: 06/12/2015 03:00   Ct Head Wo Contrast  06/09/2015   CLINICAL DATA:  Right facial droop. Right intraparenchymal hemorrhage.  EXAM: CT HEAD WITHOUT CONTRAST  TECHNIQUE: Contiguous axial images were obtained from the base of the skull through the vertex without intravenous contrast.  COMPARISON:  CT scan of June 08, 2015.  FINDINGS: Bony calvarium appears intact. The intraparenchymal hemorrhage seen in the right thalamus and basal ganglia on prior exam is significantly enlarged currently, measuring 4.6 x 3.4 cm. There is an increased amount of hemorrhage seen within the right lateral ventricle. Small amount of hemorrhage is also seen in the left posterior horn which is new since prior exam. There appears to be hemorrhage in the third ventricle as well. Slightly increased left lateral ventricular dilatation is noted. There is approximately 6 mm of left-to-right midline shift which is increased compared to prior exam. Fourth ventricle appears normal.  IMPRESSION: Intraparenchymal hemorrhage seen in right thalamus and basal ganglia on prior exam is significantly enlarged, with increased amount of hemorrhage seen within the right lateral ventricle. Small amount of hemorrhage is now noted in the left lateral and third ventricles, with increased dilatation of the left lateral ventricle. These results will be called to the ordering clinician or representative by the Radiologist Assistant, and communication  documented in the PACS or zVision Dashboard.   Electronically Signed   By: Marijo Conception, M.D.   On: 06/09/2015 10:08   Ct Head Wo Contrast  06/08/2015  CLINICAL DATA:  Intracranial hemorrhage.  Sudden behavioral change.  EXAM: CT HEAD WITHOUT CONTRAST  TECHNIQUE: Contiguous axial images were obtained from the base of the skull through the vertex without intravenous contrast.  COMPARISON:  06/07/2015 and 06/05/2015  FINDINGS: The right thalamic hemorrhage has increased in size. It currently measures 3.2 x 2.3 cm and previously measured 2.3 x 2.2 cm. There is mildly worsened mass effect on the third ventricle. There is slight increase in volume of intraventricular blood within the right occipital horn.  No other significant interval changes are evident. No extra-axial blood is evident.  IMPRESSION: Increased size of the right thalamic hemorrhage with worsened mass effect on the third ventricle. These results were called by telephone at the time of interpretation on 06/08/2015 at 2:41 am to the patient's nurse, who verbally acknowledged these results.   Electronically Signed   By: Andreas Newport M.D.   On: 06/08/2015 02:41   Ct Head Wo Contrast  06/07/2015   CLINICAL DATA:  59 year old with intracranial hemorrhage. Follow-up. Subsequent encounter.  EXAM: CT HEAD WITHOUT CONTRAST  TECHNIQUE: Contiguous axial images were obtained from the base of the skull through the vertex without intravenous contrast.  COMPARISON:  06/04/2013.  FINDINGS: Increase in size of right thalamic/posterior right internal capsule hematoma now with maximal transverse dimension 2.3 x 2.2 cm versus prior 2.1 x 1.6 cm. Increase surrounding vasogenic edema and local mass effect with mild compression the right lateral ventricle without evidence of midline shift.  Interval development of blood within the deep dependent aspect of the right lateral ventricle consistent with breakthrough of the right thalamic hemorrhage into the right  lateral ventricle.  No CT evidence of large acute thrombotic infarct.  No intracranial mass lesion separate from above described findings.  IMPRESSION: Increase in size of right thalamic/posterior right internal capsule hematoma now with maximal transverse dimension 2.3 x 2.2 cm versus prior 2.1 x 1.6 cm. Increase surrounding vasogenic edema and local mass effect with mild compression the right lateral ventricle without evidence of midline shift.  Interval development of blood within the deep dependent aspect of the right lateral ventricle consistent with breakthrough of the right thalamic hemorrhage into the right lateral ventricle.  These results were called by telephone at the time of interpretation on 06/07/2015 at 7:31 am to [facility], patient's nurse who verbally acknowledged these results.   Electronically Signed   By: Genia Del M.D.   On: 06/07/2015 07:31   Ct Head Wo Contrast  06/07/2015   CLINICAL DATA:  Status post fall last night. Patient diagnosed with intracranial hemorrhage 06/05/2015.  EXAM: CT HEAD WITHOUT CONTRAST  TECHNIQUE: Contiguous axial images were obtained from the base of the skull through the vertex without intravenous contrast.  COMPARISON:  Head CT scan 06/05/2015.  FINDINGS: Hemorrhage in the right thalamus is again seen. There is also a small amount of intraventricular blood. The appearance is unchanged. No new abnormality including new hemorrhage, infarct, mass lesion, midline shift or abnormal extra-axial fluid collection is identified. No hydrocephalus or pneumocephalus. The calvarium is intact.  IMPRESSION: Right for thalamic hemorrhage with a small amount of intraventricular blood appears unchanged. No new abnormality is seen.   Electronically Signed   By: Inge Rise M.D.   On: 06/07/2015 07:27   Ct Head Wo Contrast  06/05/2015   CLINICAL DATA:  59 year old male with left-sided weakness. Code stroke.  EXAM: CT HEAD WITHOUT CONTRAST  TECHNIQUE: Contiguous axial images  were obtained from the base of the  skull through the vertex without intravenous contrast.  COMPARISON:  None.  FINDINGS: There is a 2.1 x 1.6 cm acute intraparenchymal hemorrhage centered at the right thalamus. There is mild adjacent edema. No significant mass effect. No midline shift.  The ventricles and the sulci are appropriate in size for the patient's age. The The gray-white matter differentiation is preserved.  The visualized paranasal sinuses and mastoid air cells are well aerated. The calvarium is intact.  IMPRESSION: Acute right thalamic hemorrhage.  Critical Value/emergent results were called by telephone at the time of interpretation on 06/05/2015 at 10:39 pm to Dr. Janann Colonel , who verbally acknowledged these results.   Electronically Signed   By: Anner Crete M.D.   On: 06/05/2015 22:41   Ct Angio Chest Pe W/cm &/or Wo Cm  06/19/2015   CLINICAL DATA:  Shortness of Breath  EXAM: CT ANGIOGRAPHY CHEST WITH CONTRAST  TECHNIQUE: Multidetector CT imaging of the chest was performed using the standard protocol during bolus administration of intravenous contrast. Multiplanar CT image reconstructions and MIPs were obtained to evaluate the vascular anatomy.  CONTRAST:  70mL OMNIPAQUE IOHEXOL 350 MG/ML SOLN  COMPARISON:  Chest radiograph June 19, 2015  FINDINGS: There is no demonstrable pulmonary embolus. There is atherosclerotic change in the aortic arch region. There is no thoracic aortic aneurysm or dissection. The visualized great vessels appear unremarkable.  There is patchy bibasilar atelectatic change. There is no frank edema or consolidation. There is a small bulla in the right upper lobe peripherally near the apex.  On axial slice 37 series 6, there is a 7 mm nodular opacity along the minor fissure laterally on the right.  Thyroid appears normal. There is no appreciable thoracic adenopathy.  There are foci of coronary artery calcification. There is left ventricular hypertrophy. The pericardium is not  thickened.  In the visualized upper abdomen, there are several scattered subcentimeter liver cysts. The liver contour appears rather nodular. There is a feeding tube extending into the stomach ; tip is not seen an is below the level of imaging.  There are no blastic or lytic bone lesions.  Review of the MIP images confirms the above findings.  IMPRESSION: No demonstrable pulmonary embolus.  Patchy bibasilar atelectasis.  No frank edema or consolidation.  7 mm nodular opacity along the minor fissure on the right laterally. Followup of this nodular opacity should be based on Fleischner Society guidelines. If the patient is at high risk for bronchogenic carcinoma, follow-up chest CT at 3-2months is recommended. If the patient is at low risk for bronchogenic carcinoma, follow-up chest CT at 6-12 months is recommended. This recommendation follows the consensus statement: Guidelines for Management of Small Pulmonary Nodules Detected on CT Scans: A Statement from the [place] as published in Radiology 2005; 237:395-400.  No appreciable adenopathy.  Foci of coronary artery calcification. There is left ventricular hypertrophy.  Liver contour appears rather nodular. Question a degree of underlying hepatic cirrhosis.   Electronically Signed   By: Lowella Grip III M.D.   On: 06/19/2015 15:30   Dg Chest Port 1 View  06/19/2015   CLINICAL DATA:  Respiratory distress.  EXAM: PORTABLE CHEST - 1 VIEW  COMPARISON:  One-view chest 06/18/2015  FINDINGS: The heart size is exaggerated by low lung volumes. Mild pulmonary vascular congestion is again noted. Minimal right basilar atelectasis is present. The left IJ line has been removed. There is no pneumothorax. A feeding tube courses off the inferior border of the film.  IMPRESSION: 1.  Persistent low lung volumes exaggerating the heart size. 2. Mild bibasilar atelectasis is stable. 3. Interval removal of left IJ line without complication.   Electronically Signed   By:  San Morelle M.D.   On: 06/19/2015 11:44   Dg Chest Port 1 View  06/18/2015   CLINICAL DATA:  Subsequent evaluation airway aspiration  EXAM: PORTABLE CHEST - 1 VIEW  COMPARISON:  06/15/2015  FINDINGS: Endotracheal tube has been removed. Feeding tube is seen extending off the inferior edge of the film. No change left IJ line.  Mild cardiac enlargement. Vascular pattern normal. Mild airspace disease in the medial lung bases bilaterally.  IMPRESSION: Mild bibasilar medial airspace disease most consistent with atelectasis.   Electronically Signed   By: Skipper Cliche M.D.   On: 06/18/2015 11:52   Dg Chest Port 1 View  06/15/2015   CLINICAL DATA:  Acute respiratory failure, hypoxia, intercerebral hemorrhage, alcoholic dependence and withdrawal  EXAM: PORTABLE CHEST - 1 VIEW  COMPARISON:  Chest x-ray of June 14, 2015  FINDINGS: The lungs are well-expanded and clear. The heart and pulmonary vascularity are normal. The mediastinum is normal in width. The endotracheal tube tip lies 4.7 cm above the carina. The esophagogastric tube tip projects below the inferior margin of the image. The left internal jugular venous catheter tip projects over the midportion of the SVC. The bony thorax is unremarkable.  IMPRESSION: There is no active cardiopulmonary disease.   Electronically Signed   By: Angeligue Bowne  [place] M.D.   On: 06/15/2015 07:35   Dg Chest Port 1 View  06/14/2015   CLINICAL DATA:  Acute respiratory failure with hypoxia  EXAM: PORTABLE CHEST - 1 VIEW  COMPARISON:  Radiograph 06/12/2015  FINDINGS: Endotracheal tube and NG tube are unchanged. LEFT central venous line unchanged. Normal cardiac silhouette. No pulmonary edema. No infiltrate. No pneumothorax.  IMPRESSION: 1. Stable support apparatus. 2. No interval change.   Electronically Signed   By: Suzy Bouchard M.D.   On: 06/14/2015 09:49   Dg Chest Port 1 View  06/12/2015   CLINICAL DATA:  Intubated patient, history of intracranial hemorrhage, alcohol  withdrawal, acute respiratory failure and hypoxia.  EXAM: PORTABLE CHEST - 1 VIEW  COMPARISON:  Portable chest x-ray of June 11, 2015  FINDINGS: The right lung is adequately inflated and clear. On the left the retrocardiac region remains dense. There remains partial obscuration of the hemidiaphragm. The heart is normal in size. The pulmonary vascularity is not engorged. The endotracheal tube tip lies 3.8 cm above the carina. The esophagogastric tube tip projects below the inferior margin of the image. The left internal jugular venous catheter tip projects over the midportion of the SVC.  IMPRESSION: Stable appearance of the chest since yesterday's study. There is persistent left lower lobe atelectasis. The support tubes are in reasonable position.   Electronically Signed   By: Ingrid Shifrin  [place] M.D.   On: 06/12/2015 07:56   Dg Chest Port 1 View  06/11/2015   CLINICAL DATA:  Atelectasis described on previous chest x-ray report. Clinical data from recent chest x-ray indicating acute respiratory failure with hypoxia and alcohol withdrawal.  EXAM: PORTABLE CHEST - 1 VIEW  COMPARISON:  Chest x-ray dated 06/10/2015.  FINDINGS: Lungs are again hyperexpanded suggesting COPD. Dense opacity at the left lung base is unchanged. Lungs otherwise clear. No pleural effusion visualized. No pneumothorax.  Endotracheal tube remains well positioned with tip just above the level of the carina. Nasogastric tube passes below the diaphragm. A left-sided  internal jugular central line appears adequately positioned with tip in the superior vena cava.  IMPRESSION: 1. Dense opacity at the left lung base without significant interval change. This is compatible with previous chest x-ray report description of atelectasis. If febrile, pneumonia could not be completely excluded. 2. Lungs again hyperexpanded suggesting some degree of COPD. 3. No new lung findings. 4. Endotracheal tube and nasogastric tube appear well positioned.   Electronically Signed    By: Franki Cabot M.D.   On: 06/11/2015 08:10   Dg Chest Port 1 View  06/10/2015   CLINICAL DATA:  Intubated patient, acute respiratory failure with hypoxia, alcohol withdrawal  EXAM: PORTABLE CHEST - 1 VIEW  COMPARISON:  Portable chest x-ray of June 08, 2014  FINDINGS: The lungs remain hyperinflated. There remain coarse infrahilar lung markings bilaterally consistent with subsegmental atelectasis. There is no pneumothorax or significant pleural effusion. The cardiac silhouette is normal in size. The pulmonary vascularity is not engorged.  The endotracheal tube tip lies 4 cm above the carina. The esophagogastric tube tip projects below the inferior margin of the image. The left internal jugular venous catheter tip projects over the midportion of the SVC  IMPRESSION: 1. COPD. Improving left lower lobe atelectasis. There is no CHF or pneumonia. 2. The support tubes are in reasonable position.   Electronically Signed   By: Lemya Greenwell  [place] M.D.   On: 06/10/2015 08:02   Dg Chest Port 1 View  06/09/2015   CLINICAL DATA:  Hypoxia  EXAM: PORTABLE CHEST - 1 VIEW  COMPARISON:  June 08, 2015  FINDINGS: Endotracheal tube tip is 5.2 cm above the carina. Central catheter tip is in the superior vena cava. No pneumothorax. There is consolidation in the left lower lobe. Small left effusion. The lungs are otherwise clear. Heart size and pulmonary vascularity are normal. No adenopathy. No bone lesions.  IMPRESSION: Tube and catheter positions as described without pneumothorax. Consolidation left lower lobe with small left effusion.   Electronically Signed   By: Lowella Grip III M.D.   On: 06/09/2015 13:29   Dg Chest Port 1 View  06/08/2015   CLINICAL DATA:  Tobacco abuse.  Intracranial hemorrhage.  EXAM: PORTABLE CHEST - 1 VIEW  COMPARISON:  None.  FINDINGS: The heart size is exaggerated by low lung volumes. Mild pulmonary vascular congestion is evident. No focal airspace disease is present. The radiopaque line is noted  over the left neck. I spoke with the patient's nurse. The patient does not have intravenous ine in the left neck. This may be a peripheral IV line projecting over the patient. The visualized soft tissues and bony thorax are unremarkable otherwise.  IMPRESSION: Low lung volumes and mild pulmonary vascular congestion.   Electronically Signed   By: San Morelle M.D.   On: 06/08/2015 07:47   Dg Abd Portable 1v  06/18/2015   CLINICAL DATA:  Feeding tube replacement.  EXAM: PORTABLE ABDOMEN - 1 VIEW  COMPARISON:  One-view abdomen 06/17/2015.  FINDINGS: A single view the abdomen demonstrates the tip the small bore feeding tube in the distal stomach. The bowel gas pattern is otherwise unremarkable. The lung bases are clear.  IMPRESSION: The tip of a small bore feeding tube is in the distal stomach.   Electronically Signed   By: San Morelle M.D.   On: 06/18/2015 07:21   Dg Abd Portable 1v  06/17/2015   CLINICAL DATA:  Enteric tube placement.  EXAM: PORTABLE ABDOMEN - 1 VIEW  COMPARISON:  Earlier 06/17/2015.  FINDINGS: Exam demonstrates an enteric feeding tube with tip over the stomach in the left mid abdomen. Bowel gas pattern is nonobstructive. Remainder of the exam is unchanged.  IMPRESSION: No acute findings.  Enteric tube with tip over the stomach in the left mid abdomen.   Electronically Signed   By: Marin Olp M.D.   On: 06/17/2015 16:42   Dg Abd Portable 1v  06/17/2015   CLINICAL DATA:  Feeding tube placement  EXAM: PORTABLE ABDOMEN - 1 VIEW  COMPARISON:  June 09, 2015  FINDINGS: Feeding tube tip is in the stomach. Bowel gas pattern unremarkable. No obstruction or free air is seen on this supine examination. Lung bases are clear.  IMPRESSION: Feeding tube tip in stomach.  Bowel gas pattern unremarkable.   Electronically Signed   By: Lowella Grip III M.D.   On: 06/17/2015 12:36   Dg Abd Portable 1v  06/09/2015   CLINICAL DATA:  Orogastric tube placement  EXAM: PORTABLE ABDOMEN - 1 VIEW   COMPARISON:  None.  FINDINGS: Orogastric tube tip is in the proximal stomach. The side port is at the gastroesophageal junction. There is contrast in the distal half of the colon. There is no bowel dilatation or air-fluid level suggesting obstruction. No free air is seen on this supine examination.  IMPRESSION: Orogastric tube tip in proximal stomach. Side-port is at the gastroesophageal junction. Advise advancing tube approximately 6 cm to confirm that tube tip and side port are both well within the stomach. Overall bowel gas pattern unremarkable.   Electronically Signed   By: Lowella Grip III M.D.   On: 06/09/2015 16:34   Dg Swallowing Func-speech Pathology  06/06/2015    Objective Swallowing Evaluation: Modified Barium Swallow Study    Patient Details  Name: Mike Rowe MRN: [id number] Date of Birth: 1956/09/01  Today's Date: 06/06/2015 Time: SLP Start Time (ACUTE ONLY): 1245-SLP Stop Time (ACUTE ONLY): 1306 SLP Time Calculation (min) (ACUTE ONLY): 21 min  Past Medical History: No past medical history on file. Past Surgical History: No past surgical history on file. HPI:  Other Pertinent Information: Andie Mortimer is an 59 y.o. male with no reported  past medical history (hasn't been to doctors in years) presents with acute  onset of slurred speech and left sided weakness. LSW 2130 when he noted he  was unable to stand or use his left side. EMS called, upon arrival noted  BP of 240/140. Code stroke activated. CT head imaging reviewed, shows  right sided thalamic/BG ICH. Denies taking any blood thinners  No Data Recorded  Assessment / Plan / Recommendation CHL IP CLINICAL IMPRESSIONS 06/06/2015  Therapy Diagnosis Mild to Moderate pharyngeal phase dysphagia;Moderate  oral phase dysphagia;  Clinical Impression Pt exhibiting moderate oral pharyngeal dysphagia which  is exacerbated by his impulsivity and agitation. Silent aspiration noted  when patient rapidly consumed multiple consecutive cup sips of thin  liquids.  Trace aspirates remained below true vocal cords despite cueing  for voilitional cough. No penetration or aspiration noted in further  trials of thin liquid by cup and straw when sip size and rate where better  controlled. Pts poor condition of dentition along with residual  left  sided decreased lingual and labial sensation, decreased left sided   Lingual and labial range of motion prohibit effective mastication of solid  POs. Recommend dysphagia 2 diet and thin liquids with strict aspiration  precautions. Medicines whole with thin liquid. Full supervision  recommended for adequate carry over of strategies. ST follow  up warranted.         CHL IP TREATMENT RECOMMENDATION 06/06/2015  Treatment Recommendations Therapy as outlined in treatment plan below     CHL IP DIET RECOMMENDATION 06/06/2015  SLP Diet Recommendations Dysphagia 2 (Fine chop);Thin  Liquid Administration via (None)  Medication Administration Whole meds with liquid  Compensations Minimize environmental distractions;Slow rate;Small  sips/bites;Check for pocketing  Postural Changes and/or Swallow Maneuvers (None)     CHL IP OTHER RECOMMENDATIONS 06/06/2015  Recommended Consults (None)  Oral Care Recommendations Oral care BID  Other Recommendations (None)     No flowsheet data found.   CHL IP FREQUENCY AND DURATION 06/06/2015  Speech Therapy Frequency (ACUTE ONLY) min 2x/week  Treatment Duration 2 weeks     Pertinent Vitals/Pain        Arvil Chaco MA, CCC-SLP Acute Care Speech Language Pathologist       Levi Aland 06/06/2015, 1:44 PM         Subjective: Patient is awake and alert, but nonconversant. There is no reports of vomiting, diarrhea, respiratory distress, uncontrolled pain. He withdraws to protopathic stimuli.  Objective: Filed Vitals:   06/21/15 0155 06/21/15 0859 06/21/15 1000 06/21/15 1351  BP: 140/67  134/74 152/74  Pulse: 81  94 72  Temp: 98.1 F (36.7 C)  99.1 F (37.3 C) 98.1 F (36.7 C)  TempSrc: Axillary  Axillary  Axillary  Resp: 16  16 18   Height:      Weight:      SpO2: 99% 96% 96% 98%    Intake/Output Summary (Last 24 hours) at 06/21/15 1413 Last data filed at 06/21/15 0800  Gross per 24 hour  Intake    660 ml  Output   1100 ml  Net   -440 ml   Weight change:  Exam:   General:  Pt is alert, follows commands appropriately, not in acute distress  HEENT: No icterus, No thrush, No neck mass, [place]/AT; no meningismus  Cardiovascular: RRR, S1/S2, no rubs, no gallops  Respiratory: Bibasilar rales, right greater than left. No wheeze.  Abdomen: Soft/+BS, non tender, non distended, no guarding  Extremities: No edema, No lymphangitis, No petechiae, No rashes, no synovitis  Data Reviewed: Basic Metabolic Panel:  Recent Labs Lab 06/17/15 0350 06/18/15 0508 06/19/15 0506 06/20/15 0443 06/21/15 0800  NA 153* 150* 153* 151* 147*  K 3.5 3.3* 3.4* 4.1 4.1  CL 121* 119* 126* 124* 116*  CO2 23 22 21* 17* 24  GLUCOSE 114* 132* 135* 170* 123*  BUN 27* 44* 41* 39* 31*  CREATININE 0.76 0.94 0.96 1.01 0.92  CALCIUM 8.5* 8.6* 8.6* 8.3* 8.4*  MG  --   --  2.5*  --  2.2  PHOS  --   --  3.8  --   --    Liver Function Tests:  Recent Labs Lab 06/19/15 0506 06/20/15 0443  AST 236* 173*  ALT 346* 290*  ALKPHOS 58 54  BILITOT 0.7 0.9  PROT 6.2* 5.7*  ALBUMIN 2.7* 2.5*   No results for input(s): LIPASE, AMYLASE in the last 168 hours. No results for input(s): AMMONIA in the last 168 hours. CBC:  Recent Labs Lab 06/15/15 0440 06/17/15 0350 06/19/15 0506 06/20/15 0443 06/21/15 0800  WBC 6.1 11.0* 7.0 6.4 7.6  NEUTROABS 4.1 8.4*  --   --   --   HGB 11.6* 13.4 13.1 12.4* 12.0*  HCT 35.5* 40.9 40.6 39.4 37.8*  MCV 98.6 96.2 97.1 99.5 99.2  PLT 90* 115* 115* 95*  90*   Cardiac Enzymes:  Recent Labs Lab 06/19/15 1415 06/19/15 1939 06/20/15 1210 06/20/15 1657  TROPONINI 0.09* 0.09* 0.08* 0.07*   BNP: Invalid input(s): POCBNP CBG:  Recent Labs Lab 06/20/15 2006  06/21/15 0006 06/21/15 0403 06/21/15 0828 06/21/15 1122  GLUCAP 128* 132* 147* 135* 144*    Recent Results (from the past 240 hour(s))  Culture, blood (routine x 2)     Status: None   Collection Time: 06/11/15  5:03 PM  Result Value Ref Range Status   Specimen Description BLOOD LEFT HAND  Final   Special Requests BOTTLES DRAWN AEROBIC AND ANAEROBIC 10CC  Final   Culture NO GROWTH 5 DAYS  Final   Report Status 06/16/2015 FINAL  Final  Culture, blood (routine x 2)     Status: None   Collection Time: 06/11/15  5:05 PM  Result Value Ref Range Status   Specimen Description BLOOD RIGHT ARM  Final   Special Requests BOTTLES DRAWN AEROBIC AND ANAEROBIC 6CC  Final   Culture NO GROWTH 5 DAYS  Final   Report Status 06/16/2015 FINAL  Final  Culture, respiratory (NON-Expectorated)     Status: None   Collection Time: 06/11/15  5:18 PM  Result Value Ref Range Status   Specimen Description TRACHEAL ASPIRATE  Final   Special Requests NONE  Final   Gram Stain   Final    ABUNDANT WBC PRESENT, PREDOMINANTLY PMN RARE SQUAMOUS EPITHELIAL CELLS PRESENT MODERATE GRAM NEGATIVE RODS MODERATE GRAM POSITIVE COCCI IN PAIRS    Culture   Final    MODERATE HAEMOPHILUS INFLUENZAE Note: BETA LACTAMASE NEGATIVE MODERATE STREPTOCOCCUS,BETA HEMOLYTIC NOT GROUP A Performed at [facility]    Report Status 06/14/2015 FINAL  Final  Culture, Urine     Status: None   Collection Time: 06/11/15  6:29 PM  Result Value Ref Range Status   Specimen Description URINE, CATHETERIZED  Final   Special Requests PATIENT ON FOLLOWING VANC AND ZOSYN  Final   Culture NO GROWTH 2 DAYS  Final   Report Status 06/13/2015 FINAL  Final     Scheduled Meds: . amLODipine  10 mg Oral Daily  . ampicillin-sulbactam (UNASYN) IV  3 g Intravenous Q6H  . antiseptic oral rinse  7 mL Mouth Rinse q12n4p  . chlorhexidine  15 mL Mouth Rinse BID  . cloNIDine  0.2 mg Oral TID  . feeding supplement (PRO-STAT SUGAR FREE 64)  30 mL  Per Tube Daily  . folic acid  1 mg Oral Daily  . free water  300 mL Per Tube QID  . heparin subcutaneous  5,000 Units Subcutaneous 3 times per day  . hydrALAZINE  100 mg Per Tube 3 times per day  . insulin aspart  2-6 Units Subcutaneous 6 times per day  . ipratropium-albuterol  3 mL Nebulization TID  . labetalol  300 mg Oral TID  . multivitamin  5 mL Per Tube Daily  . senna-docusate  1 tablet Oral BID  . thiamine  100 mg Oral Daily   Continuous Infusions: . dextrose 75 mL/hr at 06/21/15 0800  . feeding supplement (JEVITY 1.5 CAL/FIBER) 1,000 mL (06/20/15 1825)     Dimitria Ketchum, DO  [facility] Pager [phone]  If 7PM-7AM, please contact night-coverage [email or web address] Password [email or web address] 06/21/2015, 2:13 PM   LOS: 16 days

## 2015-06-21 NOTE — Progress Notes (Signed)
STROKE TEAM PROGRESS NOTE   HISTORY Mike Rowe is an 59 y.o. male with no reported past medical history (hasn't been to doctors in years) presents with acute onset of slurred speech and left sided weakness. LSW 2130 when he noted he was unable to stand or use his left side. EMS called, upon arrival noted BP of 240/140. Code stroke activated. CT head imaging reviewed, shows right sided thalamic/BG ICH. Denies taking any blood thinners.  He denies any past medical history including hypertension. Denies any surgical history. Denies alcohol or drug abuse.  Date last known well: 06/05/2015 Time last known well: 2130 tPA Given: no, ICH Modified Rankin: 0 ICH Score: 1  Initial NIHSS of 12   SUBJECTIVE (INTERVAL HISTORY):   The patient makes no attempts to communicate. He appears lethargic. He follows occasional simple commands. No family members are present although there is a Actuary in the room. The patient has a nasogastric feeding tube. A barium swallow has been ordered. If the patient is unable to safely swallow will need to make arrangements for PEG placement.   Temp:  [98.1 F (36.7 C)-99.1 F (37.3 C)] 98.1 F (36.7 C) (08/07 1351) Pulse Rate:  [72-94] 72 (08/07 1351) Cardiac Rhythm:  [-] Normal sinus rhythm (08/07 0400) Resp:  [16-18] 18 (08/07 1351) BP: (134-152)/(67-75) 152/74 mmHg (08/07 1351) SpO2:  [95 %-99 %] 98 % (08/07 1351)   Recent Labs Lab 06/20/15 2006 06/21/15 0006 06/21/15 0403 06/21/15 0828 06/21/15 1122  GLUCAP 128* 132* 147* 135* 144*    Recent Labs Lab 06/17/15 0350 06/18/15 0508 06/19/15 0506 06/20/15 0443 06/21/15 0800  NA 153* 150* 153* 151* 147*  K 3.5 3.3* 3.4* 4.1 4.1  CL 121* 119* 126* 124* 116*  CO2 23 22 21* 17* 24  GLUCOSE 114* 132* 135* 170* 123*  BUN 27* 44* 41* 39* 31*  CREATININE 0.76 0.94 0.96 1.01 0.92  CALCIUM 8.5* 8.6* 8.6* 8.3* 8.4*  MG  --   --  2.5*  --  2.2  PHOS  --   --  3.8  --   --     Recent Labs Lab 06/19/15 0506  06/20/15 0443  AST 236* 173*  ALT 346* 290*  ALKPHOS 58 54  BILITOT 0.7 0.9  PROT 6.2* 5.7*  ALBUMIN 2.7* 2.5*    Recent Labs Lab 06/15/15 0440 06/17/15 0350 06/19/15 0506 06/20/15 0443 06/21/15 0800  WBC 6.1 11.0* 7.0 6.4 7.6  NEUTROABS 4.1 8.4*  --   --   --   HGB 11.6* 13.4 13.1 12.4* 12.0*  HCT 35.5* 40.9 40.6 39.4 37.8*  MCV 98.6 96.2 97.1 99.5 99.2  PLT 90* 115* 115* 95* 90*    Recent Labs Lab 06/19/15 1415 06/19/15 1939 06/20/15 1210 06/20/15 1657  TROPONINI 0.09* 0.09* 0.08* 0.07*   No results for input(s): LABPROT, INR in the last 72 hours. No results for input(s): COLORURINE, LABSPEC, [place], GLUCOSEU, HGBUR, BILIRUBINUR, KETONESUR, PROTEINUR, UROBILINOGEN, NITRITE, LEUKOCYTESUR in the last 72 hours.  Invalid input(s): APPERANCEUR     Component Value Date/Time   CHOL 153 06/16/2015 0345   TRIG 176* 06/16/2015 1035   HDL 16* 06/16/2015 0345   CHOLHDL 9.6 06/16/2015 0345   VLDL 40 06/16/2015 0345   LDLCALC 97 06/16/2015 0345   Lab Results  Component Value Date   HGBA1C 5.2 06/07/2015      Component Value Date/Time   LABOPIA NONE DETECTED 06/06/2015 0100   COCAINSCRNUR POSITIVE* 06/06/2015 0100   LABBENZ NONE DETECTED 06/06/2015  0100   AMPHETMU NONE DETECTED 06/06/2015 0100   THCU NONE DETECTED 06/06/2015 0100   LABBARB NONE DETECTED 06/06/2015 0100    No results for input(s): ETH in the last 168 hours.  Imaging  Ct Head Wo Contrast 06/05/2015    Acute right thalamic hemorrhage.    Ct Head Wo Contrast - ( Pre Fall ) 06/06/2015    Increase in size of right thalamic/posterior right internal capsule hematoma now with maximal transverse dimension 2.3 x 2.2 cm versus prior 2.1 x 1.6 cm.  Increase surrounding vasogenic edema and local mass effect with mild compression the right lateral ventricle without evidence of midline shift. Interval development of blood within the deep dependent aspect of the right lateral ventricle consistent with  breakthrough of the right thalamic hemorrhage into the right lateral ventricle.  Ct Head Wo Contrast - ( Post fall ) 06/06/2015 Right for thalamic hemorrhage with a small amount of intraventricular blood appears unchanged. No new abnormality is seen.  06/15/2015 Essentially no change compared to recent prior study. Hemorrhagic lesion measuring 4.5 x 3.6 arising from the right basal ganglia with surrounding edema, stable. Midline shift of the third ventricle toward the left is stable. Slight ventricular prominence with layering hemorrhage in the occipital horns of the lateral ventricles remain without appreciable change. No change in overall ventricular size and configuration. No new infarct compared to recent prior Study.  06/19/15  Limited assessment due to patient positioning within the CT scanner. Evolving RIGHT thalamic hemorrhage with intraventricular extension, stable moderate hydrocephalus.   CUS - Technically limited due to left IJ line, pt position, and movement. Visualized bilaterally the bifurcation, ICA, and ECA. Appears to be 1-39% ICA stenosis.  2D echo - - Left ventricle: The cavity size was normal. Wall thickness was increased in a pattern of mild LVH. Systolic function was normal. The estimated ejection fraction was in the range of 60% to 65%. Wall motion was normal; there were no regional wall motion abnormalities. Doppler parameters are consistent with abnormal left ventricular relaxation (grade 1 diastolic dysfunction).  Impressions: - No cardiac source of emboli was indentified.  CT angiogram of the chest for PE 06/19/2015 IMPRESSION: No demonstrable pulmonary embolus. Patchy bibasilar atelectasis. No frank edema or consolidation. 7 mm nodular opacity along the minor fissure on the right laterally. Followup of this nodular opacity should be based on Fleischner Society guidelines.  If the patient is at high risk for bronchogenic carcinoma,  follow-up chest CT at 3-30months is recommended. (smoker) If the patient is at low risk for bronchogenic carcinoma, follow-up chestCT at 6-12 months is recommended.  This recommendation follows the consensus statement:  Guidelines for Management of Small Pulmonary Nodules Detected on CT Scans: A Statement from the Ages as published in Radiology 2005; 237:395-400. No appreciable adenopathy. Foci of coronary artery calcification. There is left ventricular hypertrophy. Liver contour appears rather nodular. Question a degree of underlying hepatic cirrhosis.   PHYSICAL EXAM   Temp:  [98.1 F (36.7 C)-99.1 F (37.3 C)] 98.1 F (36.7 C) (08/07 1351) Pulse Rate:  [72-94] 72 (08/07 1351) Resp:  [16-18] 18 (08/07 1351) BP: (134-152)/(67-75) 152/74 mmHg (08/07 1351) SpO2:  [95 %-99 %] 98 % (08/07 1351)  General - Well nourished, well developed, intubated and not following commands.  Ophthalmologic - Fundi not visualized due to noncooperation.  Cardiovascular - Regular rate and rhythm.  Neuro -  on nasal cannula, with increased RR, mild respiratory failure.. Pt opens eyes on voice and follows some  simple commands, such as wiggle toes and showing fingers. PERRL, right sided gaze preference, doll's eyes present, positive corneal and gag and cough, breathing over the vent. On pain stimulation, left UE and LE withdraw to pain, right UE and LE spontaneous movement. LUE increased muscle tone. Babinski positive on the left. DTR decreased on the left. Sensation, coordination and gait not tested.   ASSESSMENT Mr. Levin Dagostino is a 59 y.o. male with no significant past medical history no regular medical follow-up presenting with left hemiparesis really elevated blood pressures. He did not receive IV t-PA due to acute right thalamic hemorrhage.   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 Hematoma had interval enlargement due to agitation and high BP not at goal.    Resultant  left hemiparesis, less responsive  MRI  not performed  MRA  not performed   Carotid Doppler  technically difficult, but seems unremarkable.  2D Echo - unremarkable  LDL 97  HgbA1c 5.2  SCDs for VTE prophylaxis Diet NPO time specified  no antithrombotic prior to admission, now on no antithrombotic secondary to hemorrhage  Ongoing aggressive stroke risk factor management  Therapy recommendations: SNF recommended  Disposition:  Pending  Cerebral edema  Off 3% saline now  Na 153->151->153 -> 150 -> 153-> 151  Close monitoring  Off precedex  Cocaine abuse  Positive on UDS  Hypertension  Home meds:  No antihypertensives medications prior to admission  Unstable, still high  On norvasc, clonidine, hydralazine, labetalol  Increase clonidine to 0.2mg  tid - blood pressure better  Hyperlipidemia  LDL 97  No home meds  Hold off statins due to elevated liver enzymes.  Respiratory - mild acute distress  BNP today 149.3 (H)  CXR - low lung volumes with mild bibasilar atelectasis - stable  Lactic acid 1.6 within normal range  Troponin 0.09 (H) - every 6 hours 3 - mildly elevated 3  CMP - sodium 153- potassium 3.4- chloride- 126 CO2- 21 BUN- 41 creatinine- 0.96 AST- 236 ALT- 346  CBC - WBCs 7.0  Appreciate internal medicine consult Dr. Broadus [name] (possible PE risk noted per Dr. Broadus [name])   Nutrition  Continue NG tube feedings for now  Speech therapy to perform modified barium swallow  If patient fails swallow eval will need PEG next week   Other Stroke Risk Factors  Cigarette smoking  alcohol use  Other Active Problems  Thrombocytopenia  Anxiety / agitation  Mildly elevated BUN  CT angiogram of the chest for PE  06/19/2015  7 mm nodular opacity along the minor fissure on the right laterally.  If the patient is at high risk for bronchogenic carcinoma, follow-up chest CT at 3-56months is recommended.   The patient has a  history of tobacco use.    Other Pertinent History  No family contact yet  Hospital day # Jeannette PA-C [facility] Pager [phone] 06/21/2015, 2:27 PM    I have personally examined this patient, reviewed notes, independently viewed imaging studies, participated in medical decision making and plan of care. I have made any additions or clarifications directly to the above note. Agree with note above. Plan check barium swallow if flunks get PEG tube over next few days and then transfer to SNF for rehab Antony Contras, MD .      To contact Stroke Continuity provider, please refer to [email or web address] After hours, contact General Neurology

## 2015-06-22 ENCOUNTER — Encounter (HOSPITAL_COMMUNITY): Payer: Self-pay | Admitting: *Deleted

## 2015-06-22 LAB — GLUCOSE, CAPILLARY
GLUCOSE-CAPILLARY: 137 mg/dL — AB (ref 65–99)
GLUCOSE-CAPILLARY: 157 mg/dL — AB (ref 65–99)
GLUCOSE-CAPILLARY: 120 mg/dL — AB (ref 65–99)
Glucose-Capillary: 113 mg/dL — ABNORMAL HIGH (ref 65–99)
Glucose-Capillary: 133 mg/dL — ABNORMAL HIGH (ref 65–99)
Glucose-Capillary: 155 mg/dL — ABNORMAL HIGH (ref 65–99)

## 2015-06-22 LAB — BASIC METABOLIC PANEL
Anion gap: 9 (ref 5–15)
BUN: 24 mg/dL — AB (ref 6–20)
CO2: 23 mmol/L (ref 22–32)
CREATININE: 0.76 mg/dL (ref 0.61–1.24)
Calcium: 8.1 mg/dL — ABNORMAL LOW (ref 8.9–10.3)
Chloride: 109 mmol/L (ref 101–111)
GFR calc Af Amer: >60 mL/min (ref >60)
GFR calc non Af Amer: >60 mL/min (ref >60)
Glucose, Bld: 176 mg/dL — ABNORMAL HIGH (ref 65–99)
Potassium: 3.6 mmol/L (ref 3.5–5.1)
SODIUM: 141 mmol/L (ref 135–145)

## 2015-06-22 LAB — PROCALCITONIN

## 2015-06-22 NOTE — Progress Notes (Signed)
Speech Language Pathology Treatment: Dysphagia  Patient Details Name: Mike Rowe MRN: [id number] DOB: 22-Dec-1955 Today's Date: 06/22/2015 Time: 1450-1500 SLP Time Calculation (min) (ACUTE ONLY): 10 min  Assessment / Plan / Recommendation Clinical Impression  ST follow up at request of nursing for PO readiness.  Per nurse the patient was more alert today.  When ST entered room the patient was sitting on the side of the bed with physical therapy.  Attempts were made to engage the patient and while is eyes were open he was non responsive to the therapist.  Once physical therapy left, attempted to perform oral mechanism exam without success.  The patient was unable to follow commands.  Oral care was provided and no spontaneous swallows were noted.  Presented the patient with ice chips as well as some pureed material.   He made no attempt to take the material.  Recommend keep the patient NPO with non oral nutrition.  Suspect that the patient will need long term non oral nutrition.  ST will follow up tomorrow to see the patient hopefully before he has any other therapies to see of fatigue played a role in today's results.     HPI Other Pertinent Information: Mike Rowe is an 59 y.o. male with no reported past medical history (hasn't been to doctors in years) presents with acute onset of slurred speech and left sided weakness. LSW 2130 when he noted he was unable to stand or use his left side. EMS called, upon arrival noted BP of 240/140. Code stroke activated. CT head imaging reviewed, shows right sided thalamic/BG ICH. Denies taking any blood thinners. Pt was intubated 7/26-8/2.         Recommendations Diet recommendations: NPO Medication Administration: Via alternative means              Follow up Recommendations: Skilled Nursing facility    [place], [place] 06/22/2015, 3:09 PM  Shelly Flatten, [place], [place] [facility] SLP [phone]

## 2015-06-22 NOTE — Clinical Social Work Note (Signed)
CSW reviewed chart. Pt is currently receiving feeding through [facility] tube. CSW is working with Pensions consultant to aid in placement for the pt.   Barrier: no family contact and permanent feeding source.    CSW will continue to work to identify placement for the pt.    [place], MSW, [place]

## 2015-06-22 NOTE — Progress Notes (Signed)
STROKE TEAM PROGRESS NOTE   HISTORY Mike Rowe is an 59 y.o. male with no reported past medical history (hasn't been to doctors in years) presents with acute onset of slurred speech and left sided weakness. LSW 2130 on 06/05/2015 when he noted he was unable to stand or use his left side. EMS called, upon arrival noted BP of 240/140. Code stroke activated. CT head imaging reviewed, shows right sided thalamic/BG ICH. Denies taking any blood thinners. He denies any past medical history including hypertension. Denies any surgical history. Denies alcohol or drug abuse. Modified Rankin: 0 ICH Score: 1. Initial NIHSS of 12   SUBJECTIVE (INTERVAL HISTORY):   RN at bedside. ST has not seen since 6/6. Expect eval today.   Temp:  [97.5 F (36.4 C)-98.1 F (36.7 C)] 97.5 F (36.4 C) (08/08 1009) Pulse Rate:  [59-78] 77 (08/08 1009) Cardiac Rhythm:  [-] Normal sinus rhythm (08/07 1200) Resp:  [15-18] 16 (08/08 1009) BP: (112-154)/(54-86) 152/86 mmHg (08/08 1009) SpO2:  [96 %-100 %] 99 % (08/08 1009) Weight:  [71.305 kg (157 lb 3.2 oz)] 71.305 kg (157 lb 3.2 oz) (08/08 0548)   Recent Labs Lab 06/21/15 1634 06/21/15 2010 06/22/15 0003 06/22/15 0402 06/22/15 0800  GLUCAP 146* 146* 133* 137* 157*    Recent Labs Lab 06/18/15 0508 06/19/15 0506 06/20/15 0443 06/21/15 0800 06/22/15 0759  NA 150* 153* 151* 147* 141  K 3.3* 3.4* 4.1 4.1 3.6  CL 119* 126* 124* 116* 109  CO2 22 21* 17* 24 23  GLUCOSE 132* 135* 170* 123* 176*  BUN 44* 41* 39* 31* 24*  CREATININE 0.94 0.96 1.01 0.92 0.76  CALCIUM 8.6* 8.6* 8.3* 8.4* 8.1*  MG  --  2.5*  --  2.2  --   PHOS  --  3.8  --   --   --     Recent Labs Lab 06/19/15 0506 06/20/15 0443  AST 236* 173*  ALT 346* 290*  ALKPHOS 58 54  BILITOT 0.7 0.9  PROT 6.2* 5.7*  ALBUMIN 2.7* 2.5*    Recent Labs Lab 06/17/15 0350 06/19/15 0506 06/20/15 0443 06/21/15 0800  WBC 11.0* 7.0 6.4 7.6  NEUTROABS 8.4*  --   --   --   HGB 13.4 13.1 12.4* 12.0*   HCT 40.9 40.6 39.4 37.8*  MCV 96.2 97.1 99.5 99.2  PLT 115* 115* 95* 90*    Recent Labs Lab 06/19/15 1415 06/19/15 1939 06/20/15 1210 06/20/15 1657  TROPONINI 0.09* 0.09* 0.08* 0.07*   No results for input(s): LABPROT, INR in the last 72 hours. No results for input(s): COLORURINE, LABSPEC, [place], GLUCOSEU, HGBUR, BILIRUBINUR, KETONESUR, PROTEINUR, UROBILINOGEN, NITRITE, LEUKOCYTESUR in the last 72 hours.  Invalid input(s): APPERANCEUR     Component Value Date/Time   CHOL 153 06/16/2015 0345   TRIG 176* 06/16/2015 1035   HDL 16* 06/16/2015 0345   CHOLHDL 9.6 06/16/2015 0345   VLDL 40 06/16/2015 0345   LDLCALC 97 06/16/2015 0345   Lab Results  Component Value Date   HGBA1C 5.2 06/07/2015      Component Value Date/Time   LABOPIA NONE DETECTED 06/06/2015 0100   COCAINSCRNUR POSITIVE* 06/06/2015 0100   LABBENZ NONE DETECTED 06/06/2015 0100   AMPHETMU NONE DETECTED 06/06/2015 0100   THCU NONE DETECTED 06/06/2015 0100   LABBARB NONE DETECTED 06/06/2015 0100    No results for input(s): ETH in the last 168 hours.   Ct Head Wo Contrast 06/05/2015    Acute right thalamic hemorrhage.  Ct Head Wo Contrast - ( Pre Fall ) 06/06/2015    Increase in size of right thalamic/posterior right internal capsule hematoma now with maximal transverse dimension 2.3 x 2.2 cm versus prior 2.1 x 1.6 cm.  Increase surrounding vasogenic edema and local mass effect with mild compression the right lateral ventricle without evidence of midline shift. Interval development of blood within the deep dependent aspect of the right lateral ventricle consistent with breakthrough of the right thalamic hemorrhage into the right lateral ventricle.  Ct Head Wo Contrast - ( Post fall ) 06/06/2015 Right for thalamic hemorrhage with a small amount of intraventricular blood appears unchanged. No new abnormality is seen.  06/15/2015 Essentially no change compared to recent prior study. Hemorrhagic lesion  measuring 4.5 x 3.6 arising from the right basal ganglia with surrounding edema, stable. Midline shift of the third ventricle toward the left is stable. Slight ventricular prominence with layering hemorrhage in the occipital horns of the lateral ventricles remain without appreciable change. No change in overall ventricular size and configuration. No new infarct compared to recent prior Study.  06/19/15  Limited assessment due to patient positioning within the CT scanner. Evolving RIGHT thalamic hemorrhage with intraventricular extension, stable moderate hydrocephalus.  Carotid Ultrasound Technically limited due to left IJ line, pt position, and movement. Visualized bilaterally the bifurcation, ICA, and ICA. Appears to be 1-39% ICA stenosis.  2D echo - Left ventricle: The cavity size was normal. Wall thickness was increased in a pattern of mild LVH. Systolic function was normal.The estimated ejection fraction was in the range of 60% to 65%.Wall motion was normal; there were no regional wall motionabnormalities. Doppler parameters are consistent with abnormalleft ventricular relaxation (grade 1 diastolic dysfunction). Impressions:  No cardiac source of emboli was indentified.   PHYSICAL EXAM  Temp:  [97.5 F (36.4 C)-98.1 F (36.7 C)] 97.5 F (36.4 C) (08/08 1009) Pulse Rate:  [59-78] 77 (08/08 1009) Resp:  [15-18] 16 (08/08 1009) BP: (112-154)/(54-86) 152/86 mmHg (08/08 1009) SpO2:  [96 %-100 %] 99 % (08/08 1009) Weight:  [71.305 kg (157 lb 3.2 oz)] 71.305 kg (157 lb 3.2 oz) (08/08 0548)  General - Well nourished, well developed, intubated and not following commands.  Ophthalmologic - Fundi not visualized due to noncooperation.  Cardiovascular - Regular rate and rhythm.  Neuro -  on nasal cannula, with increased RR, mild respiratory failure.. Pt opens eyes on voice and follows some simple commands, such as wiggle toes and showing fingers. PERRL, right sided gaze preference, doll's eyes  present, positive corneal and gag and cough, breathing over the vent. On pain stimulation, left UE and LE withdraw to pain, right UE and LE spontaneous movement. LUE increased muscle tone. Babinski positive on the left. DTR decreased on the left. Sensation, coordination and gait not tested.   ASSESSMENT [name] is a 59 y.o. male with no significant past medical history no regular medical follow-up presenting with left hemiparesis really elevated blood pressures. He did not receive IV t-PA due to acute right thalamic hemorrhage.   Stroke:  Right  thalamic hemorrhage with IVH and cerebral edema secondary to uncontrolled hypertension   Resultant  left hemiparesis, less responsive  MRI  not performed  MRA  not performed   Carotid Doppler  technically difficult, but seems unremarkable.  2D Echo - unremarkable  LDL 97  HgbA1c 5.2  SCDs for VTE prophylaxis Diet NPO time specified.   no antithrombotic prior to admission  Ongoing aggressive stroke risk factor management  Therapy recommendations: SNF recommended  Disposition:  Pending. Medically ready for discharge once feeding route established. Dr. Leonie Man discussed with SW. (has family but cannot find)  Cerebral edema Induced Hypernatremia  Off 3% saline now  Na normalized  Cocaine abuse  Positive on UDS  Malignant Hypertension  Home meds:  No antihypertensives medications prior to admission  BP 217/123 on arrival  On norvasc, clonidine, hydralazine, labetalol  Continues to improve  Hyperlipidemia  LDL 97  No home meds  Hold off statins due to elevated liver enzymes.  Tachypnea/respiratory distress/acute respiratory failure  On 8/5 pt w/ O2 desat with tachypnea  Suspect the patient has aspiration pneumonitis given his encephalopathy and dysphagia  BNP t149.3 (H)  CXR - low lung volumes with mild bibasilar atelectasis - stable  Lactic acid 1.6 within normal range  Troponin 0.09 (H) - every 6 hours  3 - mildly elevated 3  CMP - sodium 153- potassium 3.4- chloride- 126 CO2- 21 BUN- 41 creatinine- 0.96 AST- 236 ALT- 346  CBC - WBCs 7.0  (possible PE risk noted per Dr. Broadus [name])  Stable now  Dysphagia, malnutrition  Continue NG tube feedings   Speech therapy to eval for modified barium swallow  If patient fails swallow eval, will need PEG tomorrow  (spoke to [place], she is to call with her recommendations)  Will need 2 MDs to agree placement necessary given lack of family for consent  Other Stroke Risk Factors  Cigarette smoking  alcohol use  Other Active Problems  Thrombocytopenia  Anxiety / agitation  Mildly elevated BUN   Other Pertinent History  No family contact yet  Hospital day # [place] for Pager information 06/22/2015 1:50 PM  I have personally examined this patient, reviewed notes, independently viewed imaging studies, participated in medical decision making and plan of care. I have made any additions or clarifications directly to the above note. Agree with note above  Antony Contras, MD Medical Director [facility] Pager: [phone] 06/22/2015 1:56 PM To contact Stroke Continuity provider, please refer to [email or web address] After hours, contact General Neurology

## 2015-06-22 NOTE — Progress Notes (Signed)
PROGRESS NOTE  Mike Rowe YOV:[id number] DOB: 06/27/1956 DOA: 06/05/2015 PCP: No primary care provider on file.  Brief history 59 y.o. male with no reported past medical history (hasn't been to doctors in years) presents with acute onset of slurred speech and left sided weakness. LSW 2130 when he noted he was unable to stand or use his left side. EMS called, upon arrival noted BP of 240/140. Code stroke activated. CT head imaging reviewed, shows right sided thalamic/BG ICH.  Assessment/Plan: Tachypnea/respiratory distress/acute respiratory failure -On 06/19/2015, the patient had oxygen desaturation with tachypnea -Suspect the patient has aspiration pneumonitis given his encephalopathy and dysphagia -Also suspect a component of Cheyne-Stokes respirations -06/19/2015 CT angiogram chest negative for pulmonary embolus or pulmonary edema -Does not appear to be clinically fluid overloaded -Presenlty stable on 2 L nasal cannula--> 98% -Discontinue vancomycin -Discontinue Fortaz -Start Unasyn D#3 -Broncho-dilators -Lactic acid 1.8 -check procalcitonin--<0.10 -06/22/15--a little more alert--intermittenly follows one-step commands 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 and in setting of Cocaine use -Management per neurology service -LDL 97 -Hemoglobin A1c 5.2 -Patient has residual left hemiparesis -He is noncommunicative at this time Dysphagia -continue enteral feedings via Panda tube -Doubt he will be able to undergo modified barium swallow given his mental status -may ultimately need PEG Hypernatremia -Start hypotonic fluid-->improving -more alert with improvement -saline lock and observe Hypertension -Continue clonidine, labetalol, hydralazine, and amlodipine Polysubstance abuse -Including cocaine, alcohol, tobacco Transaminasemia -Likely secondary to chronic hepatitis C--positive qualitative RNA Hyperlipidemia -Hold statin at this time in the  setting of elevated LFTs Elevated troponin -Secondary to demand ischemia -Finish cycling troponins -Doubt ACS -EKG with ST depression insistent with LVH with repolarization abnormalities    Family Communication: Pt at beside Disposition Plan: Remain inpt. Will need SNF when stable    Procedures/Studies: Ct Head Wo Contrast  06/18/2015   CLINICAL DATA:  Follow-up intracranial hemorrhage. History of hypertension, cocaine abuse, alcohol dependence.  EXAM: CT HEAD WITHOUT CONTRAST  TECHNIQUE: Contiguous axial images were obtained from the base of the skull through the vertex without intravenous contrast.  COMPARISON:  CT head June 15, 2015 and CT head June 05, 2015  FINDINGS: Patient is obliqued in the scanner, limiting evaluation.  4.6 x 3.3 cm evolving RIGHT thalamus intraparenchymal hematoma with surrounding low-density vasogenic edema. Intraventricular extension with small amount of dependent blood products in the occipital horns. Partially effaced RIGHT temporal horn, stable moderate hydrocephalus with mild interstitial edema. Local mass effect, limited assessment for midline shift due to patient positioning. No acute large vascular territory infarct.  No abnormal extra-axial fluid collections. Stable density along the falx most consistent with calcification. Basal cisterns appear patent though, decreased by positioning. Moderate calcific atherosclerosis the carotid siphons. No skull fracture. Nasogastric tube in place.  IMPRESSION: Limited assessment due to patient positioning within the CT scanner.  Evolving RIGHT thalamic hemorrhage with intraventricular extension, stable moderate hydrocephalus.   Electronically Signed   By: Elon Alas M.D.   On: 06/18/2015 23:54   Ct Head Wo Contrast  06/15/2015   CLINICAL DATA:  Intracranial hemorrhage  EXAM: CT HEAD WITHOUT CONTRAST  TECHNIQUE: Contiguous axial images were obtained from the base of the skull through the vertex without intravenous  contrast.  COMPARISON:  June 12, 2015  FINDINGS: The hemorrhage arising from the right basal ganglia region measures 4.5 x 3.6 cm, essentially unchanged. There is surrounding vasogenic type edema. This hemorrhagic lesion impresses upon the third ventricle, deviating  the third ventricle 8 mm from the midline toward the left. This degree of midline shift of the third ventricle is not appreciably changed. There is hemorrhage extending into the occipital horn of each lateral ventricle with layering hemorrhage in the occipital horns of the lateral ventricles, not significantly changed. The lateral ventricles remain borderline prominent but stable. Fourth ventricle is in the midline.  There is no new gray-white compartment lesion compared to recent prior study.  Bony calvarium appears intact.  The mastoid air cells are clear.  There is moderate edema inferior to the hemorrhage in the medial right temporal lobe, stable.  IMPRESSION: Essentially no change compared to recent prior study. Hemorrhagic lesion measuring 4.5 x 3.6 arising from the right basal ganglia with surrounding edema, stable. Midline shift of the third ventricle toward the left is stable. Slight ventricular prominence with layering hemorrhage in the occipital horns of the lateral ventricles remain without appreciable change. No change in overall ventricular size and configuration. No new infarct compared to recent prior study.   Electronically Signed   By: Lowella Grip III M.D.   On: 06/15/2015 07:08   Ct Head Wo Contrast  06/12/2015   CLINICAL DATA:  Followup hemorrhagic stroke.  EXAM: CT HEAD WITHOUT CONTRAST  TECHNIQUE: Contiguous axial images were obtained from the base of the skull through the vertex without intravenous contrast.  COMPARISON:  CT head June 09, 2015  FINDINGS: RIGHT thalamic/basal ganglia 4.5 x 3.5 cm dense intraparenchymal hematoma is unchanged in size, surrounding low-density vasogenic edema. Again noted is intraventricular  extension with layering blood products in the occipital horn. Local mass effect, partially effaced lateral ventricle without obstructive hydrocephalus.  No acute large vascular territory infarct. Basal cisterns are patent. Mild calcific atherosclerosis of the carotid siphons.  Ocular globes and orbital contents are nonsuspicious, mild enophthalmos suspected. Visualized paranasal sinuses and mastoid air cells are well aerated.  IMPRESSION: Evolving RIGHT basal ganglia/ thalamic intraparenchymal hematoma with similar intraventricular extension and local mass effect. No hydrocephalus.   Electronically Signed   By: Elon Alas M.D.   On: 06/12/2015 03:00   Ct Head Wo Contrast  06/09/2015   CLINICAL DATA:  Right facial droop. Right intraparenchymal hemorrhage.  EXAM: CT HEAD WITHOUT CONTRAST  TECHNIQUE: Contiguous axial images were obtained from the base of the skull through the vertex without intravenous contrast.  COMPARISON:  CT scan of June 08, 2015.  FINDINGS: Bony calvarium appears intact. The intraparenchymal hemorrhage seen in the right thalamus and basal ganglia on prior exam is significantly enlarged currently, measuring 4.6 x 3.4 cm. There is an increased amount of hemorrhage seen within the right lateral ventricle. Small amount of hemorrhage is also seen in the left posterior horn which is new since prior exam. There appears to be hemorrhage in the third ventricle as well. Slightly increased left lateral ventricular dilatation is noted. There is approximately 6 mm of left-to-right midline shift which is increased compared to prior exam. Fourth ventricle appears normal.  IMPRESSION: Intraparenchymal hemorrhage seen in right thalamus and basal ganglia on prior exam is significantly enlarged, with increased amount of hemorrhage seen within the right lateral ventricle. Small amount of hemorrhage is now noted in the left lateral and third ventricles, with increased dilatation of the left lateral  ventricle. These results will be called to the ordering clinician or representative by the Radiologist Assistant, and communication documented in the PACS or zVision Dashboard.   Electronically Signed   By: Marijo Conception, M.D.  On: 06/09/2015 10:08   Ct Head Wo Contrast  06/08/2015   CLINICAL DATA:  Intracranial hemorrhage.  Sudden behavioral change.  EXAM: CT HEAD WITHOUT CONTRAST  TECHNIQUE: Contiguous axial images were obtained from the base of the skull through the vertex without intravenous contrast.  COMPARISON:  06/07/2015 and 06/05/2015  FINDINGS: The right thalamic hemorrhage has increased in size. It currently measures 3.2 x 2.3 cm and previously measured 2.3 x 2.2 cm. There is mildly worsened mass effect on the third ventricle. There is slight increase in volume of intraventricular blood within the right occipital horn.  No other significant interval changes are evident. No extra-axial blood is evident.  IMPRESSION: Increased size of the right thalamic hemorrhage with worsened mass effect on the third ventricle. These results were called by telephone at the time of interpretation on 06/08/2015 at 2:41 am to the patient's nurse, who verbally acknowledged these results.   Electronically Signed   By: Andreas Newport M.D.   On: 06/08/2015 02:41   Ct Head Wo Contrast  06/07/2015   CLINICAL DATA:  59 year old with intracranial hemorrhage. Follow-up. Subsequent encounter.  EXAM: CT HEAD WITHOUT CONTRAST  TECHNIQUE: Contiguous axial images were obtained from the base of the skull through the vertex without intravenous contrast.  COMPARISON:  06/04/2013.  FINDINGS: Increase in size of right thalamic/posterior right internal capsule hematoma now with maximal transverse dimension 2.3 x 2.2 cm versus prior 2.1 x 1.6 cm. Increase surrounding vasogenic edema and local mass effect with mild compression the right lateral ventricle without evidence of midline shift.  Interval development of blood within the deep  dependent aspect of the right lateral ventricle consistent with breakthrough of the right thalamic hemorrhage into the right lateral ventricle.  No CT evidence of large acute thrombotic infarct.  No intracranial mass lesion separate from above described findings.  IMPRESSION: Increase in size of right thalamic/posterior right internal capsule hematoma now with maximal transverse dimension 2.3 x 2.2 cm versus prior 2.1 x 1.6 cm. Increase surrounding vasogenic edema and local mass effect with mild compression the right lateral ventricle without evidence of midline shift.  Interval development of blood within the deep dependent aspect of the right lateral ventricle consistent with breakthrough of the right thalamic hemorrhage into the right lateral ventricle.  These results were called by telephone at the time of interpretation on 06/07/2015 at 7:31 am to [facility], patient's nurse who verbally acknowledged these results.   Electronically Signed   By: Genia Del M.D.   On: 06/07/2015 07:31   Ct Head Wo Contrast  06/07/2015   CLINICAL DATA:  Status post fall last night. Patient diagnosed with intracranial hemorrhage 06/05/2015.  EXAM: CT HEAD WITHOUT CONTRAST  TECHNIQUE: Contiguous axial images were obtained from the base of the skull through the vertex without intravenous contrast.  COMPARISON:  Head CT scan 06/05/2015.  FINDINGS: Hemorrhage in the right thalamus is again seen. There is also a small amount of intraventricular blood. The appearance is unchanged. No new abnormality including new hemorrhage, infarct, mass lesion, midline shift or abnormal extra-axial fluid collection is identified. No hydrocephalus or pneumocephalus. The calvarium is intact.  IMPRESSION: Right for thalamic hemorrhage with a small amount of intraventricular blood appears unchanged. No new abnormality is seen.   Electronically Signed   By: Inge Rise M.D.   On: 06/07/2015 07:27   Ct Head Wo Contrast  06/05/2015   CLINICAL DATA:   59 year old male with left-sided weakness. Code stroke.  EXAM: CT HEAD WITHOUT  CONTRAST  TECHNIQUE: Contiguous axial images were obtained from the base of the skull through the vertex without intravenous contrast.  COMPARISON:  None.  FINDINGS: There is a 2.1 x 1.6 cm acute intraparenchymal hemorrhage centered at the right thalamus. There is mild adjacent edema. No significant mass effect. No midline shift.  The ventricles and the sulci are appropriate in size for the patient's age. The The gray-white matter differentiation is preserved.  The visualized paranasal sinuses and mastoid air cells are well aerated. The calvarium is intact.  IMPRESSION: Acute right thalamic hemorrhage.  Critical Value/emergent results were called by telephone at the time of interpretation on 06/05/2015 at 10:39 pm to Dr. Janann Colonel , who verbally acknowledged these results.   Electronically Signed   By: Anner Crete M.D.   On: 06/05/2015 22:41   Ct Angio Chest Pe W/cm &/or Wo Cm  06/19/2015   CLINICAL DATA:  Shortness of Breath  EXAM: CT ANGIOGRAPHY CHEST WITH CONTRAST  TECHNIQUE: Multidetector CT imaging of the chest was performed using the standard protocol during bolus administration of intravenous contrast. Multiplanar CT image reconstructions and MIPs were obtained to evaluate the vascular anatomy.  CONTRAST:  68mL OMNIPAQUE IOHEXOL 350 MG/ML SOLN  COMPARISON:  Chest radiograph June 19, 2015  FINDINGS: There is no demonstrable pulmonary embolus. There is atherosclerotic change in the aortic arch region. There is no thoracic aortic aneurysm or dissection. The visualized great vessels appear unremarkable.  There is patchy bibasilar atelectatic change. There is no frank edema or consolidation. There is a small bulla in the right upper lobe peripherally near the apex.  On axial slice 37 series 6, there is a 7 mm nodular opacity along the minor fissure laterally on the right.  Thyroid appears normal. There is no appreciable thoracic  adenopathy.  There are foci of coronary artery calcification. There is left ventricular hypertrophy. The pericardium is not thickened.  In the visualized upper abdomen, there are several scattered subcentimeter liver cysts. The liver contour appears rather nodular. There is a feeding tube extending into the stomach ; tip is not seen an is below the level of imaging.  There are no blastic or lytic bone lesions.  Review of the MIP images confirms the above findings.  IMPRESSION: No demonstrable pulmonary embolus.  Patchy bibasilar atelectasis.  No frank edema or consolidation.  7 mm nodular opacity along the minor fissure on the right laterally. Followup of this nodular opacity should be based on Fleischner Society guidelines. If the patient is at high risk for bronchogenic carcinoma, follow-up chest CT at 3-104months is recommended. If the patient is at low risk for bronchogenic carcinoma, follow-up chest CT at 6-12 months is recommended. This recommendation follows the consensus statement: Guidelines for Management of Small Pulmonary Nodules Detected on CT Scans: A Statement from the [employer] as published in Radiology 2005; 237:395-400.  No appreciable adenopathy.  Foci of coronary artery calcification. There is left ventricular hypertrophy.  Liver contour appears rather nodular. Question a degree of underlying hepatic cirrhosis.   Electronically Signed   By: Lowella Grip III M.D.   On: 06/19/2015 15:30   Dg Chest Port 1 View  06/19/2015   CLINICAL DATA:  Respiratory distress.  EXAM: PORTABLE CHEST - 1 VIEW  COMPARISON:  One-view chest 06/18/2015  FINDINGS: The heart size is exaggerated by low lung volumes. Mild pulmonary vascular congestion is again noted. Minimal right basilar atelectasis is present. The left IJ line has been removed. There is no pneumothorax. A  feeding tube courses off the inferior border of the film.  IMPRESSION: 1. Persistent low lung volumes exaggerating the heart size. 2. Mild  bibasilar atelectasis is stable. 3. Interval removal of left IJ line without complication.   Electronically Signed   By: San Morelle M.D.   On: 06/19/2015 11:44   Dg Chest Port 1 View  06/18/2015   CLINICAL DATA:  Subsequent evaluation airway aspiration  EXAM: PORTABLE CHEST - 1 VIEW  COMPARISON:  06/15/2015  FINDINGS: Endotracheal tube has been removed. Feeding tube is seen extending off the inferior edge of the film. No change left IJ line.  Mild cardiac enlargement. Vascular pattern normal. Mild airspace disease in the medial lung bases bilaterally.  IMPRESSION: Mild bibasilar medial airspace disease most consistent with atelectasis.   Electronically Signed   By: Skipper Cliche M.D.   On: 06/18/2015 11:52   Dg Chest Port 1 View  06/15/2015   CLINICAL DATA:  Acute respiratory failure, hypoxia, intercerebral hemorrhage, alcoholic dependence and withdrawal  EXAM: PORTABLE CHEST - 1 VIEW  COMPARISON:  Chest x-ray of June 14, 2015  FINDINGS: The lungs are well-expanded and clear. The heart and pulmonary vascularity are normal. The mediastinum is normal in width. The endotracheal tube tip lies 4.7 cm above the carina. The esophagogastric tube tip projects below the inferior margin of the image. The left internal jugular venous catheter tip projects over the midportion of the SVC. The bony thorax is unremarkable.  IMPRESSION: There is no active cardiopulmonary disease.   Electronically Signed   By: Devlin Brink  [place] M.D.   On: 06/15/2015 07:35   Dg Chest Port 1 View  06/14/2015   CLINICAL DATA:  Acute respiratory failure with hypoxia  EXAM: PORTABLE CHEST - 1 VIEW  COMPARISON:  Radiograph 06/12/2015  FINDINGS: Endotracheal tube and NG tube are unchanged. LEFT central venous line unchanged. Normal cardiac silhouette. No pulmonary edema. No infiltrate. No pneumothorax.  IMPRESSION: 1. Stable support apparatus. 2. No interval change.   Electronically Signed   By: Suzy Bouchard M.D.   On: 06/14/2015 09:49    Dg Chest Port 1 View  06/12/2015   CLINICAL DATA:  Intubated patient, history of intracranial hemorrhage, alcohol withdrawal, acute respiratory failure and hypoxia.  EXAM: PORTABLE CHEST - 1 VIEW  COMPARISON:  Portable chest x-ray of June 11, 2015  FINDINGS: The right lung is adequately inflated and clear. On the left the retrocardiac region remains dense. There remains partial obscuration of the hemidiaphragm. The heart is normal in size. The pulmonary vascularity is not engorged. The endotracheal tube tip lies 3.8 cm above the carina. The esophagogastric tube tip projects below the inferior margin of the image. The left internal jugular venous catheter tip projects over the midportion of the SVC.  IMPRESSION: Stable appearance of the chest since yesterday's study. There is persistent left lower lobe atelectasis. The support tubes are in reasonable position.   Electronically Signed   By: Ernesto Lashway  [place] M.D.   On: 06/12/2015 07:56   Dg Chest Port 1 View  06/11/2015   CLINICAL DATA:  Atelectasis described on previous chest x-ray report. Clinical data from recent chest x-ray indicating acute respiratory failure with hypoxia and alcohol withdrawal.  EXAM: PORTABLE CHEST - 1 VIEW  COMPARISON:  Chest x-ray dated 06/10/2015.  FINDINGS: Lungs are again hyperexpanded suggesting COPD. Dense opacity at the left lung base is unchanged. Lungs otherwise clear. No pleural effusion visualized. No pneumothorax.  Endotracheal tube remains well positioned with tip just above  the level of the carina. Nasogastric tube passes below the diaphragm. A left-sided internal jugular central line appears adequately positioned with tip in the superior vena cava.  IMPRESSION: 1. Dense opacity at the left lung base without significant interval change. This is compatible with previous chest x-ray report description of atelectasis. If febrile, pneumonia could not be completely excluded. 2. Lungs again hyperexpanded suggesting some degree of  COPD. 3. No new lung findings. 4. Endotracheal tube and nasogastric tube appear well positioned.   Electronically Signed   By: Franki Cabot M.D.   On: 06/11/2015 08:10   Dg Chest Port 1 View  06/10/2015   CLINICAL DATA:  Intubated patient, acute respiratory failure with hypoxia, alcohol withdrawal  EXAM: PORTABLE CHEST - 1 VIEW  COMPARISON:  Portable chest x-ray of June 08, 2014  FINDINGS: The lungs remain hyperinflated. There remain coarse infrahilar lung markings bilaterally consistent with subsegmental atelectasis. There is no pneumothorax or significant pleural effusion. The cardiac silhouette is normal in size. The pulmonary vascularity is not engorged.  The endotracheal tube tip lies 4 cm above the carina. The esophagogastric tube tip projects below the inferior margin of the image. The left internal jugular venous catheter tip projects over the midportion of the SVC  IMPRESSION: 1. COPD. Improving left lower lobe atelectasis. There is no CHF or pneumonia. 2. The support tubes are in reasonable position.   Electronically Signed   By: Archita Lomeli  [place] M.D.   On: 06/10/2015 08:02   Dg Chest Port 1 View  06/09/2015   CLINICAL DATA:  Hypoxia  EXAM: PORTABLE CHEST - 1 VIEW  COMPARISON:  June 08, 2015  FINDINGS: Endotracheal tube tip is 5.2 cm above the carina. Central catheter tip is in the superior vena cava. No pneumothorax. There is consolidation in the left lower lobe. Small left effusion. The lungs are otherwise clear. Heart size and pulmonary vascularity are normal. No adenopathy. No bone lesions.  IMPRESSION: Tube and catheter positions as described without pneumothorax. Consolidation left lower lobe with small left effusion.   Electronically Signed   By: Lowella Grip III M.D.   On: 06/09/2015 13:29   Dg Chest Port 1 View  06/08/2015   CLINICAL DATA:  Tobacco abuse.  Intracranial hemorrhage.  EXAM: PORTABLE CHEST - 1 VIEW  COMPARISON:  None.  FINDINGS: The heart size is exaggerated by low lung  volumes. Mild pulmonary vascular congestion is evident. No focal airspace disease is present. The radiopaque line is noted over the left neck. I spoke with the patient's nurse. The patient does not have intravenous ine in the left neck. This may be a peripheral IV line projecting over the patient. The visualized soft tissues and bony thorax are unremarkable otherwise.  IMPRESSION: Low lung volumes and mild pulmonary vascular congestion.   Electronically Signed   By: San Morelle M.D.   On: 06/08/2015 07:47   Dg Abd Portable 1v  06/18/2015   CLINICAL DATA:  Feeding tube replacement.  EXAM: PORTABLE ABDOMEN - 1 VIEW  COMPARISON:  One-view abdomen 06/17/2015.  FINDINGS: A single view the abdomen demonstrates the tip the small bore feeding tube in the distal stomach. The bowel gas pattern is otherwise unremarkable. The lung bases are clear.  IMPRESSION: The tip of a small bore feeding tube is in the distal stomach.   Electronically Signed   By: San Morelle M.D.   On: 06/18/2015 07:21   Dg Abd Portable 1v  06/17/2015   CLINICAL DATA:  Enteric tube  placement.  EXAM: PORTABLE ABDOMEN - 1 VIEW  COMPARISON:  Earlier 06/17/2015.  FINDINGS: Exam demonstrates an enteric feeding tube with tip over the stomach in the left mid abdomen. Bowel gas pattern is nonobstructive. Remainder of the exam is unchanged.  IMPRESSION: No acute findings.  Enteric tube with tip over the stomach in the left mid abdomen.   Electronically Signed   By: Marin Olp M.D.   On: 06/17/2015 16:42   Dg Abd Portable 1v  06/17/2015   CLINICAL DATA:  Feeding tube placement  EXAM: PORTABLE ABDOMEN - 1 VIEW  COMPARISON:  June 09, 2015  FINDINGS: Feeding tube tip is in the stomach. Bowel gas pattern unremarkable. No obstruction or free air is seen on this supine examination. Lung bases are clear.  IMPRESSION: Feeding tube tip in stomach.  Bowel gas pattern unremarkable.   Electronically Signed   By: Lowella Grip III M.D.   On:  06/17/2015 12:36   Dg Abd Portable 1v  06/09/2015   CLINICAL DATA:  Orogastric tube placement  EXAM: PORTABLE ABDOMEN - 1 VIEW  COMPARISON:  None.  FINDINGS: Orogastric tube tip is in the proximal stomach. The side port is at the gastroesophageal junction. There is contrast in the distal half of the colon. There is no bowel dilatation or air-fluid level suggesting obstruction. No free air is seen on this supine examination.  IMPRESSION: Orogastric tube tip in proximal stomach. Side-port is at the gastroesophageal junction. Advise advancing tube approximately 6 cm to confirm that tube tip and side port are both well within the stomach. Overall bowel gas pattern unremarkable.   Electronically Signed   By: Lowella Grip III M.D.   On: 06/09/2015 16:34   Dg Swallowing Func-speech Pathology  06/06/2015    Objective Swallowing Evaluation: Modified Barium Swallow Study    Patient Details  Name: Mike Rowe MRN: [id number] Date of Birth: [birth year]  Today's Date: 06/06/2015 Time: SLP Start Time (ACUTE ONLY): 1245-SLP Stop Time (ACUTE ONLY): 1306 SLP Time Calculation (min) (ACUTE ONLY): 21 min  Past Medical History: No past medical history on file. Past Surgical History: No past surgical history on file. HPI:  Other Pertinent Information: Adarsh Mundorf is an 59 y.o. male with no reported  past medical history (hasn't been to doctors in years) presents with acute  onset of slurred speech and left sided weakness. LSW 2130 when he noted he  was unable to stand or use his left side. EMS called, upon arrival noted  BP of 240/140. Code stroke activated. CT head imaging reviewed, shows  right sided thalamic/BG ICH. Denies taking any blood thinners  No Data Recorded  Assessment / Plan / Recommendation CHL IP CLINICAL IMPRESSIONS 06/06/2015  Therapy Diagnosis Mild to Moderate pharyngeal phase dysphagia;Moderate  oral phase dysphagia;  Clinical Impression Pt exhibiting moderate oral pharyngeal dysphagia which  is exacerbated by his  impulsivity and agitation. Silent aspiration noted  when patient rapidly consumed multiple consecutive cup sips of thin  liquids. Trace aspirates remained below true vocal cords despite cueing  for voilitional cough. No penetration or aspiration noted in further  trials of thin liquid by cup and straw when sip size and rate where better  controlled. Pts poor condition of dentition along with residual  left  sided decreased lingual and labial sensation, decreased left sided   Lingual and labial range of motion prohibit effective mastication of solid  POs. Recommend dysphagia 2 diet and thin liquids with strict aspiration  precautions. Medicines whole with  thin liquid. Full supervision  recommended for adequate carry over of strategies. ST follow up warranted.         CHL IP TREATMENT RECOMMENDATION 06/06/2015  Treatment Recommendations Therapy as outlined in treatment plan below     CHL IP DIET RECOMMENDATION 06/06/2015  SLP Diet Recommendations Dysphagia 2 (Fine chop);Thin  Liquid Administration via (None)  Medication Administration Whole meds with liquid  Compensations Minimize environmental distractions;Slow rate;Small  sips/bites;Check for pocketing  Postural Changes and/or Swallow Maneuvers (None)     CHL IP OTHER RECOMMENDATIONS 06/06/2015  Recommended Consults (None)  Oral Care Recommendations Oral care BID  Other Recommendations (None)     No flowsheet data found.   CHL IP FREQUENCY AND DURATION 06/06/2015  Speech Therapy Frequency (ACUTE ONLY) min 2x/week  Treatment Duration 2 weeks     Pertinent Vitals/Pain        Arvil Chaco MA, CCC-SLP Acute Care Speech Language Pathologist       Levi Aland 06/06/2015, 1:44 PM         Subjective: Patient intermittently follows one-step commands. He is more alert but remains essentially nonverbal. No reports of vomiting, respiratory distress, uncontrolled pain, diarrhea.  Objective: Filed Vitals:   06/22/15 0548 06/22/15 0949 06/22/15 1009 06/22/15 1354    BP: 154/86  152/86 136/73  Pulse: 67  77 65  Temp: 97.7 F (36.5 C)  97.5 F (36.4 C) 97.5 F (36.4 C)  TempSrc: Axillary  Axillary Axillary  Resp: 16  16 16   Height:      Weight: 71.305 kg (157 lb 3.2 oz)     SpO2: 100% 100% 99% 99%    Intake/Output Summary (Last 24 hours) at 06/22/15 1456 Last data filed at 06/22/15 1208  Gross per 24 hour  Intake      0 ml  Output   2000 ml  Net  -2000 ml   Weight change:  Exam:   General:  Pt is alert, follows commands appropriately, not in acute distress  HEENT: No icterus, No thrush, No neck mass, [place]/AT  Cardiovascular: RRR, S1/S2, no rubs, no gallops  Respiratory: Bibasilar rales. No wheezing. Good air movement.  Abdomen: Soft/+BS, non tender, non distended, no guarding  Extremities: No edema, No lymphangitis, No petechiae, No rashes, no synovitis  Data Reviewed: Basic Metabolic Panel:  Recent Labs Lab 06/18/15 0508 06/19/15 0506 06/20/15 0443 06/21/15 0800 06/22/15 0759  NA 150* 153* 151* 147* 141  K 3.3* 3.4* 4.1 4.1 3.6  CL 119* 126* 124* 116* 109  CO2 22 21* 17* 24 23  GLUCOSE 132* 135* 170* 123* 176*  BUN 44* 41* 39* 31* 24*  CREATININE 0.94 0.96 1.01 0.92 0.76  CALCIUM 8.6* 8.6* 8.3* 8.4* 8.1*  MG  --  2.5*  --  2.2  --   PHOS  --  3.8  --   --   --    Liver Function Tests:  Recent Labs Lab 06/19/15 0506 06/20/15 0443  AST 236* 173*  ALT 346* 290*  ALKPHOS 58 54  BILITOT 0.7 0.9  PROT 6.2* 5.7*  ALBUMIN 2.7* 2.5*   No results for input(s): LIPASE, AMYLASE in the last 168 hours. No results for input(s): AMMONIA in the last 168 hours. CBC:  Recent Labs Lab 06/17/15 0350 06/19/15 0506 06/20/15 0443 06/21/15 0800  WBC 11.0* 7.0 6.4 7.6  NEUTROABS 8.4*  --   --   --   HGB 13.4 13.1 12.4* 12.0*  HCT 40.9 40.6 39.4 37.8*  MCV  96.2 97.1 99.5 99.2  PLT 115* 115* 95* 90*   Cardiac Enzymes:  Recent Labs Lab 06/19/15 1415 06/19/15 1939 06/20/15 1210 06/20/15 1657  TROPONINI 0.09* 0.09*  0.08* 0.07*   BNP: Invalid input(s): POCBNP CBG:  Recent Labs Lab 06/21/15 2010 06/22/15 0003 06/22/15 0402 06/22/15 0800 06/22/15 1118  GLUCAP 146* 133* 137* 157* 155*    No results found for this or any previous visit (from the past 240 hour(s)).   Scheduled Meds: . amLODipine  10 mg Oral Daily  . ampicillin-sulbactam (UNASYN) IV  3 g Intravenous Q6H  . antiseptic oral rinse  7 mL Mouth Rinse q12n4p  . chlorhexidine  15 mL Mouth Rinse BID  . cloNIDine  0.2 mg Oral TID  . feeding supplement (PRO-STAT SUGAR FREE 64)  30 mL Per Tube Daily  . folic acid  1 mg Oral Daily  . free water  300 mL Per Tube QID  . heparin subcutaneous  5,000 Units Subcutaneous 3 times per day  . hydrALAZINE  100 mg Per Tube 3 times per day  . insulin aspart  2-6 Units Subcutaneous 6 times per day  . ipratropium-albuterol  3 mL Nebulization TID  . labetalol  300 mg Oral TID  . multivitamin  5 mL Per Tube Daily  . senna-docusate  1 tablet Oral BID  . thiamine  100 mg Oral Daily   Continuous Infusions: . dextrose 1,000 mL (06/22/15 0100)  . feeding supplement (JEVITY 1.5 CAL/FIBER) 1,000 mL (06/22/15 1229)     Elvis Boot, DO  [facility] Pager [phone]  If 7PM-7AM, please contact night-coverage [email or web address] Password [email or web address] 06/22/2015, 2:56 PM   LOS: 17 days

## 2015-06-22 NOTE — Progress Notes (Signed)
Physical Therapy Treatment Patient Details Name: Mike Rowe MRN: [id number] DOB: [birth year] Today's Date: 06/22/2015    History of Present Illness Mike Rowe is an 59 y.o. male with no reported past medical history (hasn't been to doctors in years) presents with acute onset of slurred speech and left sided weakness. LSW 2130 when he noted he was unable to stand or use his left side. EMS called, upon arrival noted BP of 240/140. Code stroke activated. CT head imaging reviewed, shows right sided thalamic/BG ICH. 7/26 Transfer to the ICU for intubation and 3% saline. Extubated 8/2    PT Comments    Patient progressing slowly with mobility. Continues to require assist of 2 for dynamic sitting balance. Pt able to nod appropriately to simple yes/no questions and follow simple 1 step commands ~25% of the time. Fatigues quickly. Minimal pushing towards left today during neuro-re ed sitting EOB. Will continue to follow acutely to maximize mobility and ease burden of care prior to d/c to SNF.   Follow Up Recommendations  SNF;Supervision/Assistance - 24 hour     Equipment Recommendations  None recommended by PT    Recommendations for Other Services       Precautions / Restrictions Precautions Precautions: Fall Precaution Comments: can be combative at times Restrictions Weight Bearing Restrictions: No    Mobility  Bed Mobility Overal bed mobility: Needs Assistance Bed Mobility: Supine to Sit;Sit to Supine     Supine to sit: Total assist;+2 for physical assistance;HOB elevated Sit to supine: Total assist;+2 for physical assistance;HOB elevated   General bed mobility comments: Manual cues to place RUE on rail however pt not able to actively assist with getting to EOB.   Transfers                 General transfer comment: Deferred secondary to poor static sitting balance/safety.   Ambulation/Gait                 Stairs            Wheelchair Mobility    Modified  Rankin (Stroke Patients Only) Modified Rankin (Stroke Patients Only) Pre-Morbid Rankin Score: No symptoms Modified Rankin: Severe disability     Balance Overall balance assessment: Needs assistance Sitting-balance support: Feet supported;Single extremity supported Sitting balance-Leahy Scale: Zero Sitting balance - Comments: No righting reactions; less pushing noted today. Provided max manual cues for thoracic and lumbar extension for facilitation of trunk musculature and upright posture. Pt able to maintain for a few seconds but fatigues quickly. Performed WB through left forearm with Max A for weight shifting with trunk. Able to cross midline and towards left side visually with constant max verbal cues.  Postural control: Posterior lean;Right lateral lean;Left lateral lean                          Cognition Arousal/Alertness: Lethargic Behavior During Therapy: Flat affect Overall Cognitive Status: Impaired/Different from baseline Area of Impairment: Attention;Following commands;Awareness;Problem solving   Current Attention Level: Focused   Following Commands: Follows one step commands inconsistently;Follows one step commands with increased time   Awareness: Intellectual Problem Solving: Slow processing;Decreased initiation;Difficulty sequencing;Requires verbal cues;Requires tactile cues General Comments: Follow 1 step simple commands , "squeeze hand" "pump ankle" on RUE/LE with increased time and other commands ~25% of the time.    Exercises      General Comments        Pertinent Vitals/Pain Pain Assessment: Faces Faces Pain Scale: No hurt  Home Living                      Prior Function            PT Goals (current goals can now be found in the care plan section) Progress towards PT goals: Progressing toward goals    Frequency  Min 3X/week    PT Plan Current plan remains appropriate    Co-evaluation             End of Session  Equipment Utilized During Treatment: Oxygen Activity Tolerance: Patient tolerated treatment well Patient left: in bed;with call bell/phone within reach;with nursing/sitter in room;with restraints reapplied     Time: 1425-1451 PT Time Calculation (min) (ACUTE ONLY): 26 min  Charges:  $Neuromuscular Re-education: 23-37 mins                    G Codes:      Tawsha Terrero A Andrell Bergeson 06/22/2015, 3:47 PM Wray Kearns, [place], DPT [phone]

## 2015-06-23 ENCOUNTER — Encounter (HOSPITAL_COMMUNITY): Payer: Self-pay | Admitting: General Surgery

## 2015-06-23 DIAGNOSIS — R1310 Dysphagia, unspecified: Secondary | ICD-10-CM | POA: Insufficient documentation

## 2015-06-23 LAB — BASIC METABOLIC PANEL
Anion gap: 7 (ref 5–15)
BUN: 24 mg/dL — AB (ref 6–20)
CALCIUM: 8.3 mg/dL — AB (ref 8.9–10.3)
CHLORIDE: 109 mmol/L (ref 101–111)
CO2: 23 mmol/L (ref 22–32)
Creatinine, Ser: 0.85 mg/dL (ref 0.61–1.24)
GFR calc Af Amer: >60 mL/min (ref >60)
GFR calc non Af Amer: >60 mL/min (ref >60)
Glucose, Bld: 129 mg/dL — ABNORMAL HIGH (ref 65–99)
Potassium: 3.6 mmol/L (ref 3.5–5.1)
SODIUM: 139 mmol/L (ref 135–145)

## 2015-06-23 LAB — GLUCOSE, CAPILLARY
GLUCOSE-CAPILLARY: 146 mg/dL — AB (ref 65–99)
GLUCOSE-CAPILLARY: 90 mg/dL (ref 65–99)
GLUCOSE-CAPILLARY: 134 mg/dL — AB (ref 65–99)
Glucose-Capillary: 105 mg/dL — ABNORMAL HIGH (ref 65–99)
Glucose-Capillary: 125 mg/dL — ABNORMAL HIGH (ref 65–99)
Glucose-Capillary: 87 mg/dL (ref 65–99)

## 2015-06-23 NOTE — Clinical Social Work Note (Signed)
CSW informed by CSW Surveyor, quantity that a search has been intitated to located the pt's son. CSW spoke with Fransisco Beau at [facility] and [place] at [facility] regarding SNF placement. CSW uploaded additional clinicals to [employer].   [place], MSW, [place]

## 2015-06-23 NOTE — Progress Notes (Signed)
PROGRESS NOTE  Mike Rowe KXF:[id number] DOB: 10-24-1956 DOA: 06/05/2015 PCP: No primary care provider on file.  Brief history 59 y.o. male with no reported past medical history (hasn't been to doctors in years) presents with acute onset of slurred speech and left sided weakness. LSW 2130 when he noted he was unable to stand or use his left side. EMS called, upon arrival noted BP of 240/140. Code stroke activated. CT head imaging reviewed, shows right sided thalamic/BG ICH.  Assessment/Plan: Tachypnea/respiratory distress/acute respiratory failure -On 06/19/2015, the patient had oxygen desaturation with tachypnea -Suspect the patient has aspiration pneumonitis given his encephalopathy and dysphagia -Also suspect a component of Cheyne-Stokes respirations -06/19/2015 CT angiogram chest negative for pulmonary embolus or pulmonary edema -Does not appear to be clinically fluid overloaded -Presenlty stable on 2 L nasal cannula--> 98% -Discontinue vancomycin -Discontinue Fortaz -Start Unasyn D#4 of 7; can switch to augmentin when ready to d/c -Broncho-dilators -Lactic acid 1.8 -06/22/15--a little more alert--intermittenly follows one-step commands 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 and in setting of Cocaine use -Management per neurology service -LDL 97 -Hemoglobin A1c 5.2 -Patient has residual left hemiparesis -He is nonverbal at this time but intermittenly follows one step commands Dysphagia -continue enteral feedings via Panda tube -06/23/15--pt pulled out Panda tube-->reconsulted IR to replace -plans for PEG on 06/25/15 if able to get consent/guardianship Encephalopathy -Secondary to hemorrhagic stroke -More alert since correction of his electrolytes --He is nonverbal at this time but intermittenly follows one step commands Hypernatremia -Start hypotonic fluid-->improving -more alert with improvement -8/816--saline lock and observe -Continue  free water flushes with enteral feedings Hypertension -Continue clonidine, labetalol, hydralazine, and amlodipine Polysubstance abuse -Including cocaine, alcohol, tobacco Transaminasemia -Likely secondary to chronic hepatitis C--positive qualitative RNA Hyperlipidemia -Hold statin at this time in the setting of elevated LFTs Elevated troponin -Secondary to demand ischemia -Finish cycling troponins -Doubt ACS -EKG with ST depression insistent with LVH with repolarization abnormalities    Family Communication: Pt at beside Disposition Plan: Remain inpt. Will need SNF when stable     Procedures/Studies: Ct Head Wo Contrast  06/18/2015   CLINICAL DATA:  Follow-up intracranial hemorrhage. History of hypertension, cocaine abuse, alcohol dependence.  EXAM: CT HEAD WITHOUT CONTRAST  TECHNIQUE: Contiguous axial images were obtained from the base of the skull through the vertex without intravenous contrast.  COMPARISON:  CT head June 15, 2015 and CT head June 05, 2015  FINDINGS: Patient is obliqued in the scanner, limiting evaluation.  4.6 x 3.3 cm evolving RIGHT thalamus intraparenchymal hematoma with surrounding low-density vasogenic edema. Intraventricular extension with small amount of dependent blood products in the occipital horns. Partially effaced RIGHT temporal horn, stable moderate hydrocephalus with mild interstitial edema. Local mass effect, limited assessment for midline shift due to patient positioning. No acute large vascular territory infarct.  No abnormal extra-axial fluid collections. Stable density along the falx most consistent with calcification. Basal cisterns appear patent though, decreased by positioning. Moderate calcific atherosclerosis the carotid siphons. No skull fracture. Nasogastric tube in place.  IMPRESSION: Limited assessment due to patient positioning within the CT scanner.  Evolving RIGHT thalamic hemorrhage with intraventricular extension, stable moderate  hydrocephalus.   Electronically Signed   By: Elon Alas M.D.   On: 06/18/2015 23:54   Ct Head Wo Contrast  06/15/2015   CLINICAL DATA:  Intracranial hemorrhage  EXAM: CT HEAD WITHOUT CONTRAST  TECHNIQUE: Contiguous axial images were obtained from the base of the  skull through the vertex without intravenous contrast.  COMPARISON:  June 12, 2015  FINDINGS: The hemorrhage arising from the right basal ganglia region measures 4.5 x 3.6 cm, essentially unchanged. There is surrounding vasogenic type edema. This hemorrhagic lesion impresses upon the third ventricle, deviating the third ventricle 8 mm from the midline toward the left. This degree of midline shift of the third ventricle is not appreciably changed. There is hemorrhage extending into the occipital horn of each lateral ventricle with layering hemorrhage in the occipital horns of the lateral ventricles, not significantly changed. The lateral ventricles remain borderline prominent but stable. Fourth ventricle is in the midline.  There is no new gray-white compartment lesion compared to recent prior study.  Bony calvarium appears intact.  The mastoid air cells are clear.  There is moderate edema inferior to the hemorrhage in the medial right temporal lobe, stable.  IMPRESSION: Essentially no change compared to recent prior study. Hemorrhagic lesion measuring 4.5 x 3.6 arising from the right basal ganglia with surrounding edema, stable. Midline shift of the third ventricle toward the left is stable. Slight ventricular prominence with layering hemorrhage in the occipital horns of the lateral ventricles remain without appreciable change. No change in overall ventricular size and configuration. No new infarct compared to recent prior study.   Electronically Signed   By: Lowella Grip III M.D.   On: 06/15/2015 07:08   Ct Head Wo Contrast  06/12/2015   CLINICAL DATA:  Followup hemorrhagic stroke.  EXAM: CT HEAD WITHOUT CONTRAST  TECHNIQUE: Contiguous  axial images were obtained from the base of the skull through the vertex without intravenous contrast.  COMPARISON:  CT head June 09, 2015  FINDINGS: RIGHT thalamic/basal ganglia 4.5 x 3.5 cm dense intraparenchymal hematoma is unchanged in size, surrounding low-density vasogenic edema. Again noted is intraventricular extension with layering blood products in the occipital horn. Local mass effect, partially effaced lateral ventricle without obstructive hydrocephalus.  No acute large vascular territory infarct. Basal cisterns are patent. Mild calcific atherosclerosis of the carotid siphons.  Ocular globes and orbital contents are nonsuspicious, mild enophthalmos suspected. Visualized paranasal sinuses and mastoid air cells are well aerated.  IMPRESSION: Evolving RIGHT basal ganglia/ thalamic intraparenchymal hematoma with similar intraventricular extension and local mass effect. No hydrocephalus.   Electronically Signed   By: Elon Alas M.D.   On: 06/12/2015 03:00   Ct Head Wo Contrast  06/09/2015   CLINICAL DATA:  Right facial droop. Right intraparenchymal hemorrhage.  EXAM: CT HEAD WITHOUT CONTRAST  TECHNIQUE: Contiguous axial images were obtained from the base of the skull through the vertex without intravenous contrast.  COMPARISON:  CT scan of June 08, 2015.  FINDINGS: Bony calvarium appears intact. The intraparenchymal hemorrhage seen in the right thalamus and basal ganglia on prior exam is significantly enlarged currently, measuring 4.6 x 3.4 cm. There is an increased amount of hemorrhage seen within the right lateral ventricle. Small amount of hemorrhage is also seen in the left posterior horn which is new since prior exam. There appears to be hemorrhage in the third ventricle as well. Slightly increased left lateral ventricular dilatation is noted. There is approximately 6 mm of left-to-right midline shift which is increased compared to prior exam. Fourth ventricle appears normal.  IMPRESSION:  Intraparenchymal hemorrhage seen in right thalamus and basal ganglia on prior exam is significantly enlarged, with increased amount of hemorrhage seen within the right lateral ventricle. Small amount of hemorrhage is now noted in the left lateral and third  ventricles, with increased dilatation of the left lateral ventricle. These results will be called to the ordering clinician or representative by the Radiologist Assistant, and communication documented in the PACS or zVision Dashboard.   Electronically Signed   By: Marijo Conception, M.D.   On: 06/09/2015 10:08   Ct Head Wo Contrast  06/08/2015   CLINICAL DATA:  Intracranial hemorrhage.  Sudden behavioral change.  EXAM: CT HEAD WITHOUT CONTRAST  TECHNIQUE: Contiguous axial images were obtained from the base of the skull through the vertex without intravenous contrast.  COMPARISON:  06/07/2015 and 06/05/2015  FINDINGS: The right thalamic hemorrhage has increased in size. It currently measures 3.2 x 2.3 cm and previously measured 2.3 x 2.2 cm. There is mildly worsened mass effect on the third ventricle. There is slight increase in volume of intraventricular blood within the right occipital horn.  No other significant interval changes are evident. No extra-axial blood is evident.  IMPRESSION: Increased size of the right thalamic hemorrhage with worsened mass effect on the third ventricle. These results were called by telephone at the time of interpretation on 06/08/2015 at 2:41 am to the patient's nurse, who verbally acknowledged these results.   Electronically Signed   By: Andreas Newport M.D.   On: 06/08/2015 02:41   Ct Head Wo Contrast  06/07/2015   CLINICAL DATA:  59 year old with intracranial hemorrhage. Follow-up. Subsequent encounter.  EXAM: CT HEAD WITHOUT CONTRAST  TECHNIQUE: Contiguous axial images were obtained from the base of the skull through the vertex without intravenous contrast.  COMPARISON:  06/04/2013.  FINDINGS: Increase in size of right  thalamic/posterior right internal capsule hematoma now with maximal transverse dimension 2.3 x 2.2 cm versus prior 2.1 x 1.6 cm. Increase surrounding vasogenic edema and local mass effect with mild compression the right lateral ventricle without evidence of midline shift.  Interval development of blood within the deep dependent aspect of the right lateral ventricle consistent with breakthrough of the right thalamic hemorrhage into the right lateral ventricle.  No CT evidence of large acute thrombotic infarct.  No intracranial mass lesion separate from above described findings.  IMPRESSION: Increase in size of right thalamic/posterior right internal capsule hematoma now with maximal transverse dimension 2.3 x 2.2 cm versus prior 2.1 x 1.6 cm. Increase surrounding vasogenic edema and local mass effect with mild compression the right lateral ventricle without evidence of midline shift.  Interval development of blood within the deep dependent aspect of the right lateral ventricle consistent with breakthrough of the right thalamic hemorrhage into the right lateral ventricle.  These results were called by telephone at the time of interpretation on 06/07/2015 at 7:31 am to [facility], patient's nurse who verbally acknowledged these results.   Electronically Signed   By: Genia Del M.D.   On: 06/07/2015 07:31   Ct Head Wo Contrast  06/07/2015   CLINICAL DATA:  Status post fall last night. Patient diagnosed with intracranial hemorrhage 06/05/2015.  EXAM: CT HEAD WITHOUT CONTRAST  TECHNIQUE: Contiguous axial images were obtained from the base of the skull through the vertex without intravenous contrast.  COMPARISON:  Head CT scan 06/05/2015.  FINDINGS: Hemorrhage in the right thalamus is again seen. There is also a small amount of intraventricular blood. The appearance is unchanged. No new abnormality including new hemorrhage, infarct, mass lesion, midline shift or abnormal extra-axial fluid collection is identified. No  hydrocephalus or pneumocephalus. The calvarium is intact.  IMPRESSION: Right for thalamic hemorrhage with a small amount of intraventricular blood appears  unchanged. No new abnormality is seen.   Electronically Signed   By: Inge Rise M.D.   On: 06/07/2015 07:27   Ct Head Wo Contrast  06/05/2015   CLINICAL DATA:  59 year old male with left-sided weakness. Code stroke.  EXAM: CT HEAD WITHOUT CONTRAST  TECHNIQUE: Contiguous axial images were obtained from the base of the skull through the vertex without intravenous contrast.  COMPARISON:  None.  FINDINGS: There is a 2.1 x 1.6 cm acute intraparenchymal hemorrhage centered at the right thalamus. There is mild adjacent edema. No significant mass effect. No midline shift.  The ventricles and the sulci are appropriate in size for the patient's age. The The gray-white matter differentiation is preserved.  The visualized paranasal sinuses and mastoid air cells are well aerated. The calvarium is intact.  IMPRESSION: Acute right thalamic hemorrhage.  Critical Value/emergent results were called by telephone at the time of interpretation on 06/05/2015 at 10:39 pm to Dr. Janann Colonel , who verbally acknowledged these results.   Electronically Signed   By: Anner Crete M.D.   On: 06/05/2015 22:41   Ct Angio Chest Pe W/cm &/or Wo Cm  06/19/2015   CLINICAL DATA:  Shortness of Breath  EXAM: CT ANGIOGRAPHY CHEST WITH CONTRAST  TECHNIQUE: Multidetector CT imaging of the chest was performed using the standard protocol during bolus administration of intravenous contrast. Multiplanar CT image reconstructions and MIPs were obtained to evaluate the vascular anatomy.  CONTRAST:  35mL OMNIPAQUE IOHEXOL 350 MG/ML SOLN  COMPARISON:  Chest radiograph June 19, 2015  FINDINGS: There is no demonstrable pulmonary embolus. There is atherosclerotic change in the aortic arch region. There is no thoracic aortic aneurysm or dissection. The visualized great vessels appear unremarkable.  There is  patchy bibasilar atelectatic change. There is no frank edema or consolidation. There is a small bulla in the right upper lobe peripherally near the apex.  On axial slice 37 series 6, there is a 7 mm nodular opacity along the minor fissure laterally on the right.  Thyroid appears normal. There is no appreciable thoracic adenopathy.  There are foci of coronary artery calcification. There is left ventricular hypertrophy. The pericardium is not thickened.  In the visualized upper abdomen, there are several scattered subcentimeter liver cysts. The liver contour appears rather nodular. There is a feeding tube extending into the stomach ; tip is not seen an is below the level of imaging.  There are no blastic or lytic bone lesions.  Review of the MIP images confirms the above findings.  IMPRESSION: No demonstrable pulmonary embolus.  Patchy bibasilar atelectasis.  No frank edema or consolidation.  7 mm nodular opacity along the minor fissure on the right laterally. Followup of this nodular opacity should be based on Fleischner Society guidelines. If the patient is at high risk for bronchogenic carcinoma, follow-up chest CT at 3-20months is recommended. If the patient is at low risk for bronchogenic carcinoma, follow-up chest CT at 6-12 months is recommended. This recommendation follows the consensus statement: Guidelines for Management of Small Pulmonary Nodules Detected on CT Scans: A Statement from the [place] as published in Radiology 2005; 237:395-400.  No appreciable adenopathy.  Foci of coronary artery calcification. There is left ventricular hypertrophy.  Liver contour appears rather nodular. Question a degree of underlying hepatic cirrhosis.   Electronically Signed   By: Lowella Grip III M.D.   On: 06/19/2015 15:30   Dg Chest Port 1 View  06/19/2015   CLINICAL DATA:  Respiratory distress.  EXAM:  PORTABLE CHEST - 1 VIEW  COMPARISON:  One-view chest 06/18/2015  FINDINGS: The heart size is exaggerated  by low lung volumes. Mild pulmonary vascular congestion is again noted. Minimal right basilar atelectasis is present. The left IJ line has been removed. There is no pneumothorax. A feeding tube courses off the inferior border of the film.  IMPRESSION: 1. Persistent low lung volumes exaggerating the heart size. 2. Mild bibasilar atelectasis is stable. 3. Interval removal of left IJ line without complication.   Electronically Signed   By: San Morelle M.D.   On: 06/19/2015 11:44   Dg Chest Port 1 View  06/18/2015   CLINICAL DATA:  Subsequent evaluation airway aspiration  EXAM: PORTABLE CHEST - 1 VIEW  COMPARISON:  06/15/2015  FINDINGS: Endotracheal tube has been removed. Feeding tube is seen extending off the inferior edge of the film. No change left IJ line.  Mild cardiac enlargement. Vascular pattern normal. Mild airspace disease in the medial lung bases bilaterally.  IMPRESSION: Mild bibasilar medial airspace disease most consistent with atelectasis.   Electronically Signed   By: Skipper Cliche M.D.   On: 06/18/2015 11:52   Dg Chest Port 1 View  06/15/2015   CLINICAL DATA:  Acute respiratory failure, hypoxia, intercerebral hemorrhage, alcoholic dependence and withdrawal  EXAM: PORTABLE CHEST - 1 VIEW  COMPARISON:  Chest x-ray of June 14, 2015  FINDINGS: The lungs are well-expanded and clear. The heart and pulmonary vascularity are normal. The mediastinum is normal in width. The endotracheal tube tip lies 4.7 cm above the carina. The esophagogastric tube tip projects below the inferior margin of the image. The left internal jugular venous catheter tip projects over the midportion of the SVC. The bony thorax is unremarkable.  IMPRESSION: There is no active cardiopulmonary disease.   Electronically Signed   By: Kyley Solow  [place] M.D.   On: 06/15/2015 07:35   Dg Chest Port 1 View  06/14/2015   CLINICAL DATA:  Acute respiratory failure with hypoxia  EXAM: PORTABLE CHEST - 1 VIEW  COMPARISON:  Radiograph  06/12/2015  FINDINGS: Endotracheal tube and NG tube are unchanged. LEFT central venous line unchanged. Normal cardiac silhouette. No pulmonary edema. No infiltrate. No pneumothorax.  IMPRESSION: 1. Stable support apparatus. 2. No interval change.   Electronically Signed   By: Suzy Bouchard M.D.   On: 06/14/2015 09:49   Dg Chest Port 1 View  06/12/2015   CLINICAL DATA:  Intubated patient, history of intracranial hemorrhage, alcohol withdrawal, acute respiratory failure and hypoxia.  EXAM: PORTABLE CHEST - 1 VIEW  COMPARISON:  Portable chest x-ray of June 11, 2015  FINDINGS: The right lung is adequately inflated and clear. On the left the retrocardiac region remains dense. There remains partial obscuration of the hemidiaphragm. The heart is normal in size. The pulmonary vascularity is not engorged. The endotracheal tube tip lies 3.8 cm above the carina. The esophagogastric tube tip projects below the inferior margin of the image. The left internal jugular venous catheter tip projects over the midportion of the SVC.  IMPRESSION: Stable appearance of the chest since yesterday's study. There is persistent left lower lobe atelectasis. The support tubes are in reasonable position.   Electronically Signed   By: Yeny Schmoll  [place] M.D.   On: 06/12/2015 07:56   Dg Chest Port 1 View  06/11/2015   CLINICAL DATA:  Atelectasis described on previous chest x-ray report. Clinical data from recent chest x-ray indicating acute respiratory failure with hypoxia and alcohol withdrawal.  EXAM: PORTABLE  CHEST - 1 VIEW  COMPARISON:  Chest x-ray dated 06/10/2015.  FINDINGS: Lungs are again hyperexpanded suggesting COPD. Dense opacity at the left lung base is unchanged. Lungs otherwise clear. No pleural effusion visualized. No pneumothorax.  Endotracheal tube remains well positioned with tip just above the level of the carina. Nasogastric tube passes below the diaphragm. A left-sided internal jugular central line appears adequately  positioned with tip in the superior vena cava.  IMPRESSION: 1. Dense opacity at the left lung base without significant interval change. This is compatible with previous chest x-ray report description of atelectasis. If febrile, pneumonia could not be completely excluded. 2. Lungs again hyperexpanded suggesting some degree of COPD. 3. No new lung findings. 4. Endotracheal tube and nasogastric tube appear well positioned.   Electronically Signed   By: Franki Cabot M.D.   On: 06/11/2015 08:10   Dg Chest Port 1 View  06/10/2015   CLINICAL DATA:  Intubated patient, acute respiratory failure with hypoxia, alcohol withdrawal  EXAM: PORTABLE CHEST - 1 VIEW  COMPARISON:  Portable chest x-ray of June 08, 2014  FINDINGS: The lungs remain hyperinflated. There remain coarse infrahilar lung markings bilaterally consistent with subsegmental atelectasis. There is no pneumothorax or significant pleural effusion. The cardiac silhouette is normal in size. The pulmonary vascularity is not engorged.  The endotracheal tube tip lies 4 cm above the carina. The esophagogastric tube tip projects below the inferior margin of the image. The left internal jugular venous catheter tip projects over the midportion of the SVC  IMPRESSION: 1. COPD. Improving left lower lobe atelectasis. There is no CHF or pneumonia. 2. The support tubes are in reasonable position.   Electronically Signed   By: Ewen Varnell  [place] M.D.   On: 06/10/2015 08:02   Dg Chest Port 1 View  06/09/2015   CLINICAL DATA:  Hypoxia  EXAM: PORTABLE CHEST - 1 VIEW  COMPARISON:  June 08, 2015  FINDINGS: Endotracheal tube tip is 5.2 cm above the carina. Central catheter tip is in the superior vena cava. No pneumothorax. There is consolidation in the left lower lobe. Small left effusion. The lungs are otherwise clear. Heart size and pulmonary vascularity are normal. No adenopathy. No bone lesions.  IMPRESSION: Tube and catheter positions as described without pneumothorax.  Consolidation left lower lobe with small left effusion.   Electronically Signed   By: Lowella Grip III M.D.   On: 06/09/2015 13:29   Dg Chest Port 1 View  06/08/2015   CLINICAL DATA:  Tobacco abuse.  Intracranial hemorrhage.  EXAM: PORTABLE CHEST - 1 VIEW  COMPARISON:  None.  FINDINGS: The heart size is exaggerated by low lung volumes. Mild pulmonary vascular congestion is evident. No focal airspace disease is present. The radiopaque line is noted over the left neck. I spoke with the patient's nurse. The patient does not have intravenous ine in the left neck. This may be a peripheral IV line projecting over the patient. The visualized soft tissues and bony thorax are unremarkable otherwise.  IMPRESSION: Low lung volumes and mild pulmonary vascular congestion.   Electronically Signed   By: San Morelle M.D.   On: 06/08/2015 07:47   Dg Abd Portable 1v  06/18/2015   CLINICAL DATA:  Feeding tube replacement.  EXAM: PORTABLE ABDOMEN - 1 VIEW  COMPARISON:  One-view abdomen 06/17/2015.  FINDINGS: A single view the abdomen demonstrates the tip the small bore feeding tube in the distal stomach. The bowel gas pattern is otherwise unremarkable. The lung bases are  clear.  IMPRESSION: The tip of a small bore feeding tube is in the distal stomach.   Electronically Signed   By: San Morelle M.D.   On: 06/18/2015 07:21   Dg Abd Portable 1v  06/17/2015   CLINICAL DATA:  Enteric tube placement.  EXAM: PORTABLE ABDOMEN - 1 VIEW  COMPARISON:  Earlier 06/17/2015.  FINDINGS: Exam demonstrates an enteric feeding tube with tip over the stomach in the left mid abdomen. Bowel gas pattern is nonobstructive. Remainder of the exam is unchanged.  IMPRESSION: No acute findings.  Enteric tube with tip over the stomach in the left mid abdomen.   Electronically Signed   By: Marin Olp M.D.   On: 06/17/2015 16:42   Dg Abd Portable 1v  06/17/2015   CLINICAL DATA:  Feeding tube placement  EXAM: PORTABLE ABDOMEN - 1 VIEW   COMPARISON:  June 09, 2015  FINDINGS: Feeding tube tip is in the stomach. Bowel gas pattern unremarkable. No obstruction or free air is seen on this supine examination. Lung bases are clear.  IMPRESSION: Feeding tube tip in stomach.  Bowel gas pattern unremarkable.   Electronically Signed   By: Lowella Grip III M.D.   On: 06/17/2015 12:36   Dg Abd Portable 1v  06/09/2015   CLINICAL DATA:  Orogastric tube placement  EXAM: PORTABLE ABDOMEN - 1 VIEW  COMPARISON:  None.  FINDINGS: Orogastric tube tip is in the proximal stomach. The side port is at the gastroesophageal junction. There is contrast in the distal half of the colon. There is no bowel dilatation or air-fluid level suggesting obstruction. No free air is seen on this supine examination.  IMPRESSION: Orogastric tube tip in proximal stomach. Side-port is at the gastroesophageal junction. Advise advancing tube approximately 6 cm to confirm that tube tip and side port are both well within the stomach. Overall bowel gas pattern unremarkable.   Electronically Signed   By: Lowella Grip III M.D.   On: 06/09/2015 16:34   Dg Swallowing Func-speech Pathology  06/06/2015    Objective Swallowing Evaluation: Modified Barium Swallow Study    Patient Details  Name: Mike Rowe MRN: [id number] Date of Birth: 1956-11-12  Today's Date: 06/06/2015 Time: SLP Start Time (ACUTE ONLY): 1245-SLP Stop Time (ACUTE ONLY): 1306 SLP Time Calculation (min) (ACUTE ONLY): 21 min  Past Medical History: No past medical history on file. Past Surgical History: No past surgical history on file. HPI:  Other Pertinent Information: Worley Radermacher is an 59 y.o. male with no reported  past medical history (hasn't been to doctors in years) presents with acute  onset of slurred speech and left sided weakness. LSW 2130 when he noted he  was unable to stand or use his left side. EMS called, upon arrival noted  BP of 240/140. Code stroke activated. CT head imaging reviewed, shows  right sided  thalamic/BG ICH. Denies taking any blood thinners  No Data Recorded  Assessment / Plan / Recommendation CHL IP CLINICAL IMPRESSIONS 06/06/2015  Therapy Diagnosis Mild to Moderate pharyngeal phase dysphagia;Moderate  oral phase dysphagia;  Clinical Impression Pt exhibiting moderate oral pharyngeal dysphagia which  is exacerbated by his impulsivity and agitation. Silent aspiration noted  when patient rapidly consumed multiple consecutive cup sips of thin  liquids. Trace aspirates remained below true vocal cords despite cueing  for voilitional cough. No penetration or aspiration noted in further  trials of thin liquid by cup and straw when sip size and rate where better  controlled. Pts poor condition  of dentition along with residual  left  sided decreased lingual and labial sensation, decreased left sided   Lingual and labial range of motion prohibit effective mastication of solid  POs. Recommend dysphagia 2 diet and thin liquids with strict aspiration  precautions. Medicines whole with thin liquid. Full supervision  recommended for adequate carry over of strategies. ST follow up warranted.         CHL IP TREATMENT RECOMMENDATION 06/06/2015  Treatment Recommendations Therapy as outlined in treatment plan below     CHL IP DIET RECOMMENDATION 06/06/2015  SLP Diet Recommendations Dysphagia 2 (Fine chop);Thin  Liquid Administration via (None)  Medication Administration Whole meds with liquid  Compensations Minimize environmental distractions;Slow rate;Small  sips/bites;Check for pocketing  Postural Changes and/or Swallow Maneuvers (None)     CHL IP OTHER RECOMMENDATIONS 06/06/2015  Recommended Consults (None)  Oral Care Recommendations Oral care BID  Other Recommendations (None)     No flowsheet data found.   CHL IP FREQUENCY AND DURATION 06/06/2015  Speech Therapy Frequency (ACUTE ONLY) min 2x/week  Treatment Duration 2 weeks     Pertinent Vitals/Pain        Arvil Chaco MA, CCC-SLP Acute Care Speech Language Pathologist        Levi Aland 06/06/2015, 1:44 PM          Subjective: Patient is nonverbal but follows one-step commands. No reports of his prior distress, gradual pain, vomiting, diarrhea.  Objective: Filed Vitals:   06/23/15 0958 06/23/15 1008 06/23/15 1426 06/23/15 1754  BP:  148/84 130/78 148/81  Pulse:  73 64 78  Temp:  98.2 F (36.8 C) 97.5 F (36.4 C) 97.9 F (36.6 C)  TempSrc:  Oral Oral Oral  Resp:  24 16 18   Height:      Weight:      SpO2: 94% 99% 99% 99%    Intake/Output Summary (Last 24 hours) at 06/23/15 1947 Last data filed at 06/23/15 1030  Gross per 24 hour  Intake    150 ml  Output   1450 ml  Net  -1300 ml   Weight change: -3.175 kg (-7 lb) Exam:   General:  Pt is alert, follows commands appropriately, not in acute distress  HEENT: No icterus, No thrush, No neck mass, [place]/AT  Cardiovascular: RRR, S1/S2, no rubs, no gallops  Respiratory: CTA bilaterally, no wheezing, no crackles, no rhonchi  Abdomen: Soft/+BS, non tender, non distended, no guarding  Extremities: No edema, No lymphangitis, No petechiae, No rashes, no synovitis  Data Reviewed: Basic Metabolic Panel:  Recent Labs Lab 06/19/15 0506 06/20/15 0443 06/21/15 0800 06/22/15 0759 06/23/15 1006  NA 153* 151* 147* 141 139  K 3.4* 4.1 4.1 3.6 3.6  CL 126* 124* 116* 109 109  CO2 21* 17* 24 23 23   GLUCOSE 135* 170* 123* 176* 129*  BUN 41* 39* 31* 24* 24*  CREATININE 0.96 1.01 0.92 0.76 0.85  CALCIUM 8.6* 8.3* 8.4* 8.1* 8.3*  MG 2.5*  --  2.2  --   --   PHOS 3.8  --   --   --   --    Liver Function Tests:  Recent Labs Lab 06/19/15 0506 06/20/15 0443  AST 236* 173*  ALT 346* 290*  ALKPHOS 58 54  BILITOT 0.7 0.9  PROT 6.2* 5.7*  ALBUMIN 2.7* 2.5*   No results for input(s): LIPASE, AMYLASE in the last 168 hours. No results for input(s): AMMONIA in the last 168 hours. CBC:  Recent Labs Lab 06/17/15  6203 06/19/15 0506 06/20/15 0443 06/21/15 0800  WBC 11.0* 7.0 6.4 7.6    NEUTROABS 8.4*  --   --   --   HGB 13.4 13.1 12.4* 12.0*  HCT 40.9 40.6 39.4 37.8*  MCV 96.2 97.1 99.5 99.2  PLT 115* 115* 95* 90*   Cardiac Enzymes:  Recent Labs Lab 06/19/15 1415 06/19/15 1939 06/20/15 1210 06/20/15 1657  TROPONINI 0.09* 0.09* 0.08* 0.07*   BNP: Invalid input(s): POCBNP CBG:  Recent Labs Lab 06/23/15 0010 06/23/15 0408 06/23/15 0737 06/23/15 1138 06/23/15 1617  GLUCAP 125* 105* 146* 90 134*    No results found for this or any previous visit (from the past 240 hour(s)).   Scheduled Meds: . amLODipine  10 mg Oral Daily  . ampicillin-sulbactam (UNASYN) IV  3 g Intravenous Q6H  . antiseptic oral rinse  7 mL Mouth Rinse q12n4p  . chlorhexidine  15 mL Mouth Rinse BID  . cloNIDine  0.2 mg Oral TID  . feeding supplement (PRO-STAT SUGAR FREE 64)  30 mL Per Tube Daily  . folic acid  1 mg Oral Daily  . free water  300 mL Per Tube QID  . heparin subcutaneous  5,000 Units Subcutaneous 3 times per day  . hydrALAZINE  100 mg Per Tube 3 times per day  . insulin aspart  2-6 Units Subcutaneous 6 times per day  . ipratropium-albuterol  3 mL Nebulization TID  . labetalol  300 mg Oral TID  . multivitamin  5 mL Per Tube Daily  . senna-docusate  1 tablet Oral BID  . thiamine  100 mg Oral Daily   Continuous Infusions: . feeding supplement (JEVITY 1.5 CAL/FIBER) 1,000 mL (06/23/15 1055)     Maricela Schreur, DO  [facility] Pager [phone]  If 7PM-7AM, please contact night-coverage [email or web address] Password [email or web address] 06/23/2015, 7:47 PM   LOS: 18 days

## 2015-06-23 NOTE — Consult Note (Signed)
Reason for Consult: evaluation for PEG placement Referring Physician: Dr. Antony Contras   HPI: Mike Rowe is a 59 year old male who came to ED on 06/05/15 with slurred speech and left sided weakness.  He was found to have a right thalamic hemorrhage with IVF and cerebral edema resulting in left hemiparesis and dysphagia.  We have been asked to evaluate for PEG tube placement.  He currently has a PANDA and is tolerating tube feeds. The patient opens his eyes, but does not communicate.  History reviewed. No pertinent past medical history.  History reviewed. No pertinent past surgical history.  History reviewed. No pertinent family history.  Social History:  reports that he has never smoked. He does not have any smokeless tobacco history on file. His alcohol and drug histories are not on file.  Allergies: No Known Allergies  Medications:  No current facility-administered medications on file prior to encounter.   No current outpatient prescriptions on file prior to encounter.     Results for orders placed or performed during the hospital encounter of 06/05/15 (from the past 48 hour(s))  Glucose, capillary     Status: Abnormal   Collection Time: 06/21/15  4:34 PM  Result Value Ref Range   Glucose-Capillary 146 (H) 65 - 99 mg/dL  Glucose, capillary     Status: Abnormal   Collection Time: 06/21/15  8:10 PM  Result Value Ref Range   Glucose-Capillary 146 (H) 65 - 99 mg/dL   Comment 1 Notify RN    Comment 2 Document in Chart   Glucose, capillary     Status: Abnormal   Collection Time: 06/22/15 12:03 AM  Result Value Ref Range   Glucose-Capillary 133 (H) 65 - 99 mg/dL   Comment 1 Notify RN    Comment 2 Document in Chart   Glucose, capillary     Status: Abnormal   Collection Time: 06/22/15  4:02 AM  Result Value Ref Range   Glucose-Capillary 137 (H) 65 - 99 mg/dL   Comment 1 Notify RN    Comment 2 Document in Chart   Procalcitonin     Status: None   Collection Time: 06/22/15  7:59  AM  Result Value Ref Range   Procalcitonin <0.10 ng/mL    Comment:        Interpretation: PCT (Procalcitonin) <= 0.5 ng/mL: Systemic infection (sepsis) is not likely. Local bacterial infection is possible. (NOTE)         ICU PCT Algorithm               Non ICU PCT Algorithm    ----------------------------     ------------------------------         PCT < 0.25 ng/mL                 PCT < 0.1 ng/mL     Stopping of antibiotics            Stopping of antibiotics       strongly encouraged.               strongly encouraged.    ----------------------------     ------------------------------       PCT level decrease by               PCT < 0.25 ng/mL       >= 80% from peak PCT       OR PCT 0.25 - 0.5 ng/mL          Stopping of antibiotics  encouraged.     Stopping of antibiotics           encouraged.    ----------------------------     ------------------------------       PCT level decrease by              PCT >= 0.25 ng/mL       < 80% from peak PCT        AND PCT >= 0.5 ng/mL            Continuin g antibiotics                                              encouraged.       Continuing antibiotics            encouraged.    ----------------------------     ------------------------------     PCT level increase compared          PCT > 0.5 ng/mL         with peak PCT AND          PCT >= 0.5 ng/mL             Escalation of antibiotics                                          strongly encouraged.      Escalation of antibiotics        strongly encouraged.   Basic metabolic panel     Status: Abnormal   Collection Time: 06/22/15  7:59 AM  Result Value Ref Range   Sodium 141 135 - 145 mmol/L   Potassium 3.6 3.5 - 5.1 mmol/L   Chloride 109 101 - 111 mmol/L   CO2 23 22 - 32 mmol/L   Glucose, Bld 176 (H) 65 - 99 mg/dL   BUN 24 (H) 6 - 20 mg/dL   Creatinine, Ser 0.76 0.61 - 1.24 mg/dL   Calcium 8.1 (L) 8.9 - 10.3 mg/dL   GFR calc non Af Amer >60 >60  mL/min   GFR calc Af Amer >60 >60 mL/min    Comment: (NOTE) The eGFR has been calculated using the CKD EPI equation. This calculation has not been validated in all clinical situations. eGFR's persistently <60 mL/min signify possible Chronic Kidney Disease.    Anion gap 9 5 - 15  Glucose, capillary     Status: Abnormal   Collection Time: 06/22/15  8:00 AM  Result Value Ref Range   Glucose-Capillary 157 (H) 65 - 99 mg/dL   Comment 1 Document in Chart   Glucose, capillary     Status: Abnormal   Collection Time: 06/22/15 11:18 AM  Result Value Ref Range   Glucose-Capillary 155 (H) 65 - 99 mg/dL   Comment 1 Document in Chart   Glucose, capillary     Status: Abnormal   Collection Time: 06/22/15  4:36 PM  Result Value Ref Range   Glucose-Capillary 113 (H) 65 - 99 mg/dL  Glucose, capillary     Status: Abnormal   Collection Time: 06/22/15  8:15 PM  Result Value Ref Range   Glucose-Capillary 120 (H) 65 - 99 mg/dL   Comment 1 Notify RN    Comment 2 Document in Chart   Glucose,  capillary     Status: Abnormal   Collection Time: 06/23/15 12:10 AM  Result Value Ref Range   Glucose-Capillary 125 (H) 65 - 99 mg/dL   Comment 1 Notify RN    Comment 2 Document in Chart   Glucose, capillary     Status: Abnormal   Collection Time: 06/23/15  4:08 AM  Result Value Ref Range   Glucose-Capillary 105 (H) 65 - 99 mg/dL   Comment 1 Notify RN    Comment 2 Document in Chart   Glucose, capillary     Status: Abnormal   Collection Time: 06/23/15  7:37 AM  Result Value Ref Range   Glucose-Capillary 146 (H) 65 - 99 mg/dL   Comment 1 Notify RN    Comment 2 Document in Chart   Basic metabolic panel     Status: Abnormal   Collection Time: 06/23/15 10:06 AM  Result Value Ref Range   Sodium 139 135 - 145 mmol/L   Potassium 3.6 3.5 - 5.1 mmol/L   Chloride 109 101 - 111 mmol/L   CO2 23 22 - 32 mmol/L   Glucose, Bld 129 (H) 65 - 99 mg/dL   BUN 24 (H) 6 - 20 mg/dL   Creatinine, Ser 0.85 0.61 - 1.24 mg/dL    Calcium 8.3 (L) 8.9 - 10.3 mg/dL   GFR calc non Af Amer >60 >60 mL/min   GFR calc Af Amer >60 >60 mL/min    Comment: (NOTE) The eGFR has been calculated using the CKD EPI equation. This calculation has not been validated in all clinical situations. eGFR's persistently <60 mL/min signify possible Chronic Kidney Disease.    Anion gap 7 5 - 15  Glucose, capillary     Status: None   Collection Time: 06/23/15 11:38 AM  Result Value Ref Range   Glucose-Capillary 90 65 - 99 mg/dL   Comment 1 Notify RN    Comment 2 Document in Chart     No results found.  Review of Systems  Unable to perform ROS  Blood pressure 148/84, pulse 73, temperature 98.2 F (36.8 C), temperature source Oral, resp. rate 24, height 6' (1.829 m), weight 68.13 kg (150 lb 3.2 oz), SpO2 99 %. Physical Exam  Constitutional: No distress.  HENT:  Head: Normocephalic and atraumatic.  Mouth/Throat: No oropharyngeal exudate.  Eyes: Conjunctivae are normal. Pupils are equal, round, and reactive to light.  Cardiovascular: Normal rate, regular rhythm, normal heart sounds and intact distal pulses.  Exam reveals no gallop and no friction rub.   No murmur heard. Respiratory: Effort normal and breath sounds normal. No respiratory distress. He has no wheezes. He has no rales. He exhibits no tenderness.  Irregular breathing pattern  GI: Soft. Bowel sounds are normal. He exhibits no distension and no mass. There is no tenderness. There is no rebound and no guarding.  No previous abdominal scars  Neurological:  Opens eyes, does not follow commands. Flaccid left side.  Moved RUE.   Skin: Skin is warm and dry. No rash noted. He is not diaphoretic. No erythema. No pallor.    Assessment/Plan: Right thalamic stroke Hypertension Hx substance abuse Dysphagia   The patient has failed a swallow evaluation and needs alternative nutrition.   He would benefit from a percutaneous gastrostomy tube placement.  He is unable to sign  consent and has not family.  Endoscopy does not have any availably tomorrow.  Only availability is Thursday at [facility]  Will plan to hold tube feeds tomorrow night.  Thank you for the consult. Will follow along.   Erby Pian ANP-BC Pager [phone] 06/23/2015, 12:34 PM

## 2015-06-23 NOTE — Progress Notes (Signed)
Pt earlier pulled out his NG Tube,to be replaced in IR in the morning but has scheduled BP medications for tonight, Dr Leonel Ramsay (on call) paged and notified,said to hold them tonight and to use the PRN labetalol if needed,pt calm in bed with sitter at bedside, will however continue to monitor. Mike Rowe, Mike Rowe

## 2015-06-23 NOTE — Progress Notes (Signed)
Writer called to patient's room by bedside sitter and noted patient has removed his NG tube from left nares. Two attempts made without success. MD paged and awaiting response at this time.

## 2015-06-23 NOTE — Progress Notes (Signed)
STROKE TEAM PROGRESS NOTE   HISTORY Mike Rowe is an 59 y.o. male with no reported past medical history (hasn't been to doctors in years) presents with acute onset of slurred speech and left sided weakness. LSW 2130 on 06/05/2015 when he noted he was unable to stand or use his left side. EMS called, upon arrival noted BP of 240/140. Code stroke activated. CT head imaging reviewed, shows right sided thalamic/BG ICH. Denies taking any blood thinners. He denies any past medical history including hypertension. Denies any surgical history. Denies alcohol or drug abuse. Modified Rankin: 0 ICH Score: 1. Initial NIHSS of 12   SUBJECTIVE (INTERVAL HISTORY):   Pt failed ST eval yesterday. He needs a PEG for feeding prior to discharge. Patient is on his way to modified barium swallow   Temp:  [97.5 F (36.4 C)-98.4 F (36.9 C)] 97.5 F (36.4 C) (08/09 1426) Pulse Rate:  [58-76] 64 (08/09 1426) Cardiac Rhythm:  [-] Normal sinus rhythm (08/08 2220) Resp:  [16-24] 16 (08/09 1426) BP: (106-148)/(55-84) 130/78 mmHg (08/09 1426) SpO2:  [94 %-100 %] 99 % (08/09 1426) Weight:  [68.13 kg (150 lb 3.2 oz)] 68.13 kg (150 lb 3.2 oz) (08/09 0500)   Recent Labs Lab 06/22/15 2015 06/23/15 0010 06/23/15 0408 06/23/15 0737 06/23/15 1138  GLUCAP 120* 125* 105* 146* 90    Recent Labs Lab 06/19/15 0506 06/20/15 0443 06/21/15 0800 06/22/15 0759 06/23/15 1006  NA 153* 151* 147* 141 139  K 3.4* 4.1 4.1 3.6 3.6  CL 126* 124* 116* 109 109  CO2 21* 17* 24 23 23   GLUCOSE 135* 170* 123* 176* 129*  BUN 41* 39* 31* 24* 24*  CREATININE 0.96 1.01 0.92 0.76 0.85  CALCIUM 8.6* 8.3* 8.4* 8.1* 8.3*  MG 2.5*  --  2.2  --   --   PHOS 3.8  --   --   --   --     Recent Labs Lab 06/19/15 0506 06/20/15 0443  AST 236* 173*  ALT 346* 290*  ALKPHOS 58 54  BILITOT 0.7 0.9  PROT 6.2* 5.7*  ALBUMIN 2.7* 2.5*    Recent Labs Lab 06/17/15 0350 06/19/15 0506 06/20/15 0443 06/21/15 0800  WBC 11.0* 7.0 6.4 7.6   NEUTROABS 8.4*  --   --   --   HGB 13.4 13.1 12.4* 12.0*  HCT 40.9 40.6 39.4 37.8*  MCV 96.2 97.1 99.5 99.2  PLT 115* 115* 95* 90*    Recent Labs Lab 06/19/15 1415 06/19/15 1939 06/20/15 1210 06/20/15 1657  TROPONINI 0.09* 0.09* 0.08* 0.07*   No results for input(s): LABPROT, INR in the last 72 hours. No results for input(s): COLORURINE, LABSPEC, [place], GLUCOSEU, HGBUR, BILIRUBINUR, KETONESUR, PROTEINUR, UROBILINOGEN, NITRITE, LEUKOCYTESUR in the last 72 hours.  Invalid input(s): APPERANCEUR     Component Value Date/Time   CHOL 153 06/16/2015 0345   TRIG 176* 06/16/2015 1035   HDL 16* 06/16/2015 0345   CHOLHDL 9.6 06/16/2015 0345   VLDL 40 06/16/2015 0345   LDLCALC 97 06/16/2015 0345   Lab Results  Component Value Date   HGBA1C 5.2 06/07/2015      Component Value Date/Time   LABOPIA NONE DETECTED 06/06/2015 0100   COCAINSCRNUR POSITIVE* 06/06/2015 0100   LABBENZ NONE DETECTED 06/06/2015 0100   AMPHETMU NONE DETECTED 06/06/2015 0100   THCU NONE DETECTED 06/06/2015 0100   LABBARB NONE DETECTED 06/06/2015 0100    No results for input(s): ETH in the last 168 hours.   CT HEAD with CONSTRAST  06/05/2015    Acute right thalamic hemorrhage.    06/06/2015    ( Pre Fall ) Increase in size of right thalamic/posterior right internal capsule hematoma now with maximal transverse dimension 2.3 x 2.2 cm versus prior 2.1 x 1.6 cm.  Increase surrounding vasogenic edema and local mass effect with mild compression the right lateral ventricle without evidence of midline shift. Interval development of blood within the deep dependent aspect of the right lateral ventricle consistent with breakthrough of the right thalamic hemorrhage into the right lateral ventricle.  06/06/2015  ( Post fall ) Right for thalamic hemorrhage with a small amount of intraventricular blood appears unchanged. No new abnormality is seen.  06/15/2015 Essentially no change compared to recent prior study.  Hemorrhagic lesion measuring 4.5 x 3.6 arising from the right basal ganglia with surrounding edema, stable. Midline shift of the third ventricle toward the left is stable. Slight ventricular prominence with layering hemorrhage in the occipital horns of the lateral ventricles remain without appreciable change. No change in overall ventricular size and configuration. No new infarct compared to recent prior Study.  06/19/15  Limited assessment due to patient positioning within the CT scanner. Evolving RIGHT thalamic hemorrhage with intraventricular extension, stable moderate hydrocephalus.  Carotid Ultrasound Technically limited due to left IJ line, pt position, and movement. Visualized bilaterally the bifurcation, ICA, and ICA. Appears to be 1-39% ICA stenosis.  2D echo - Left ventricle: The cavity size was normal. Wall thickness was increased in a pattern of mild LVH. Systolic function was normal.The estimated ejection fraction was in the range of 60% to 65%.Wall motion was normal; there were no regional wall motionabnormalities. Doppler parameters are consistent with abnormalleft ventricular relaxation (grade 1 diastolic dysfunction). Impressions:  No cardiac source of emboli was indentified.   PHYSICAL EXAM  Temp:  [97.5 F (36.4 C)-98.4 F (36.9 C)] 97.5 F (36.4 C) (08/09 1426) Pulse Rate:  [58-76] 64 (08/09 1426) Resp:  [16-24] 16 (08/09 1426) BP: (106-148)/(55-84) 130/78 mmHg (08/09 1426) SpO2:  [94 %-100 %] 99 % (08/09 1426) Weight:  [68.13 kg (150 lb 3.2 oz)] 68.13 kg (150 lb 3.2 oz) (08/09 0500)  General - Well nourished, well developed, intubated and not following commands.  Ophthalmologic - Fundi not visualized due to noncooperation.  Cardiovascular - Regular rate and rhythm.  Neuro -  on nasal cannula, with increased RR, mild respiratory failure.. Pt opens eyes on voice and follows some simple commands, such as wiggle toes and showing fingers. PERRL, right sided gaze  preference, doll's eyes present, positive corneal and gag and cough, breathing over the vent. On pain stimulation, left UE and LE withdraw to pain, right UE and LE spontaneous movement. LUE increased muscle tone. Babinski positive on the left. DTR decreased on the left. Sensation, coordination and gait not tested.   ASSESSMENT [name] is a 59 y.o. male with no significant past medical history no regular medical follow-up presenting with left hemiparesis really elevated blood pressures. He did not receive IV t-PA due to acute right thalamic hemorrhage.   Stroke:  Right  thalamic hemorrhage with IVH and cerebral edema secondary to uncontrolled hypertension   Resultant  left hemiparesis, less responsive  MRI  not performed  MRA  not performed   Carotid Doppler  technically difficult, but seems unremarkable.  2D Echo - unremarkable  LDL 97  HgbA1c 5.2  SCDs for VTE prophylaxis Diet NPO time specified.   no antithrombotic prior to admission  Ongoing aggressive stroke risk  factor management  Therapy recommendations: SNF recommended  Disposition:  Pending. Medically ready for discharge once feeding route established. Dr. Leonie Man discussed with SW. (has family but cannot find)  Cerebral edema Induced Hypernatremia  Off 3% saline   Na normalized  Cocaine abuse  Positive on UDS  Malignant Hypertension  Home meds:  No antihypertensives medications prior to admission  BP 217/123 on arrival  On norvasc, clonidine, hydralazine, labetalol  Continues to improve  Hyperlipidemia  LDL 97  No home meds  Hold off statins due to elevated liver enzymes.  Tachypnea/respiratory distress/acute respiratory failure  On 8/5 pt w/ O2 desat with tachypnea  Suspect the patient has aspiration pneumonitis given his encephalopathy and dysphagia  BNP t149.3 (H)  CXR - low lung volumes with mild bibasilar atelectasis - stable  Lactic acid 1.6 within normal range  Troponin  0.09 (H) - every 6 hours 3 - mildly elevated 3  CMP - sodium 153- potassium 3.4- chloride- 126 CO2- 21 BUN- 41 creatinine- 0.96 AST- 236 ALT- 346  CBC - WBCs 7.0  (possible PE risk noted per Dr. Broadus [name])  Stable now  Dysphagia, malnutrition  Continue NG tube feedings   Speech therapy recommends PEG  Trauma consulted for PEG   Will need 2 MDs to agree placement necessary given lack of family for consent  Other Stroke Risk Factors  Cigarette smoking  alcohol use  Cocaine use  Other Active Problems  Thrombocytopenia  Anxiety / agitation  Mildly elevated BUN   Other Pertinent History  No family contact yet  Hospital day # [place] for Pager information 06/23/2015 3:52 PM  I have personally examined this patient, reviewed notes, independently viewed imaging studies, participated in medical decision making and plan of care. I have made any additions or clarifications directly to the above note. Agree with note above.    Antony Contras, MD Medical Director [facility] Pager: [phone] 06/24/2015 11:14 AM  06/23/2015 3:52 PM To contact Stroke Continuity provider, please refer to [email or web address] After hours, contact General Neurology

## 2015-06-23 NOTE — Progress Notes (Addendum)
Speech Language Pathology Treatment: Dysphagia  Patient Details Name: Kayn Haymore MRN: [id number] DOB: 28-May-1956 Today's Date: 06/23/2015 Time: 0826-0906 SLP Time Calculation (min) (ACUTE ONLY): 40 min  Assessment / Plan / Recommendation Clinical Impression  Pt seen to assess readiness for po/instrumental swallow evaluation.  Pt nodded head yes/no appropriately during session.  Oral care provided using oral kit with suctioning.  Copious viscous white tinged secretions adhered to superior tongue WITHOUT pt awareness.  Very limited mandibular movement decreases pt ability to adequacy of oral care.  Educated him to importance of oral care for pulmonary hygiene and comfort and pt nodded yes to allowing oral care.   Pt allowed SLP to provide extensive oral care= and denies discomfort with this - although he raises his mitted hand at time.  Pt did not cough, throat clear or voice/articulate on command despite max cues and clinical reasoning - ? Motor planning deficits.    Pt nodded head "yes" to desire for ice chips.  SLP provided small single ice chips after oral care - delayed oral transiting/oral holding noted, however pt did elicit a swallow with 3/4 ice boluses - suspect pharyngeal reflexive swallow triggered = not volitional.  SLP orally suctioned when pt failed to transit last bolus of ice.  Mildly wet breathing quality noted at end of session - SLP left pt sitting upright in bed for maximal airway protection.    Pt will benefit from consideration for long term alternative means of nutrition as he has not demonstrated adequate progress to transition into po and he reports *via yes nod* discomfort with feeding tube.  Sitter in room and assisted SLP with setting up equipment for oral care.  Continue goals.     HPI Other Pertinent Information: Herberto Ledwell is an 59 y.o. male with no reported past medical history (hasn't been to doctors in years) presents with acute onset of slurred speech and left sided  weakness. LSW 2130 when he noted he was unable to stand or use his left side. EMS called, upon arrival noted BP of 240/140. Code stroke activated. CT head imaging reviewed, shows right sided thalamic/BG ICH. Denies taking any blood thinners. Pt was intubated 7/26-8/2.   Pertinent Vitals Pain Assessment: Faces  SLP Plan  MBS (when pt appropriate)    Recommendations Diet recommendations: NPO Medication Administration: Via alternative means              Oral Care Recommendations: Oral care QID Follow up Recommendations: Skilled Nursing facility Plan: MBS (when pt appropriate)    GO     Claudie Fisherman, [place] [facility] SLP [phone]

## 2015-06-23 NOTE — Progress Notes (Signed)
Occupational Therapy Treatment Patient Details Name: Mike Rowe MRN: [id number] DOB: 08/14/1956 Today's Date: 06/23/2015    History of present illness Mike Rowe is an 59 y.o. male with no reported past medical history (hasn't been to doctors in years) presents with acute onset of slurred speech and left sided weakness. LSW 2130 when he noted he was unable to stand or use his left side. EMS called, upon arrival noted BP of 240/140. Code stroke activated. CT head imaging reviewed, shows right sided thalamic/BG ICH. 7/26 Transfer to the ICU for intubation and 3% saline. Extubated 8/2   OT comments  Pt progressing somewhat towards acute rehab goals. Pt sat EOB with max +2 assist second to posterior lean for about 3 minutes. Pt cued by therapist to "find my eyes" with therapist at pt's midline. Pt noted to initiate eye turn to left 2/3 times achieving looking at target 1x briefly, unable to hold left gaze. OT to continue to follow acutely. D/c plan remains appropriate.  Follow Up Recommendations  SNF    Equipment Recommendations  None recommended by OT    Recommendations for Other Services      Precautions / Restrictions Precautions Precautions: Fall Precaution Comments: can be combative at times Restrictions Weight Bearing Restrictions: No       Mobility Bed Mobility Overal bed mobility: +2 for physical assistance;Needs Assistance Bed Mobility: Supine to Sit;Sit to Supine Rolling: Total assist   Supine to sit: Total assist;+2 for physical assistance;HOB elevated Sit to supine: Total assist;+2 for physical assistance;HOB elevated   General bed mobility comments: Cued to hold bed rail and provided hand-over hand support but pt unable to utilize RUE to assist with bed mobility.  Transfers                 General transfer comment: not attempted due to zero sitting balance    Balance Overall balance assessment: Needs assistance Sitting-balance support: Feet supported;Single  extremity supported Sitting balance-Leahy Scale: Zero Sitting balance - Comments: Pt sat EOB with max +2 assist second to posterior lean. Pt's hands placed in weight-bearing position but pt minimally supporting self through RUE. Pt cued ny therapist to "find my eyes" with therapist at pt's midline. Pt noted to initiate eye turn to left 2/3 times achieving looking at target 1x briefly, unable to hold left gaze.  Postural control: Posterior lean;Right lateral lean;Left lateral lean     Standing balance comment: not attempted due to zero sitting balance                   ADL Overall ADL's : Needs assistance/impaired                                       General ADL Comments: Total A for ADLs. NPO. Pt sat EOB for 3 minutes with max +2 assist. BUE placed in weightbearing position but pt unable to utilize BUE on left side and minimally so on right side.       Vision                 Additional Comments: Pt maintained downward right gaze throughout session. Briefly achieved looking at target presented at midline with max verbal cues. Unable to maintain midline gaze with head in down and right position.   Perception     Praxis      Cognition   Behavior During Therapy: Flat affect;Impulsive (  attempted to grab NG tube at times) Overall Cognitive Status: Difficult to assess Area of Impairment: Attention;Following commands;Awareness;Safety/judgement   Current Attention Level: Focused    Following Commands: Follows one step commands inconsistently Safety/Judgement: Decreased awareness of safety;Decreased awareness of deficits Awareness: Intellectual Problem Solving: Slow processing;Decreased initiation;Difficulty sequencing;Requires verbal cues;Requires tactile cues General Comments: Difficult to assess due to impaired communication and lethargy.    Extremity/Trunk Assessment               Exercises     Shoulder Instructions       General Comments       Pertinent Vitals/ Pain       Pain Assessment: Faces Faces Pain Scale: No hurt  Home Living                                          Prior Functioning/Environment              Frequency Min 2X/week     Progress Toward Goals  OT Goals(current goals can now be found in the care plan section)  Progress towards OT goals: Progressing toward goals  Acute Rehab OT Goals Patient Stated Goal: unable OT Goal Formulation: Patient unable to participate in goal setting Time For Goal Achievement: 06/22/15 Potential to Achieve Goals: Fair ADL Goals Pt Will Perform Grooming: with min assist;sitting Pt Will Transfer to Toilet: with mod assist;with +2 assist;stand pivot transfer;bedside commode Pt Will Perform Toileting - Clothing Manipulation and hygiene: with mod assist;bed level Additional ADL Goal #1: Pt will maintain sustained attention x 4 mins during simple grooming tasks   Plan Discharge plan remains appropriate    Co-evaluation                 End of Session Equipment Utilized During Treatment: Oxygen;Other (comment) (Pt has feeding tube.)   Activity Tolerance Patient limited by lethargy;Treatment limited secondary to agitation   Patient Left in bed;with call bell/phone within reach;with restraints reapplied;with SCD's reapplied;with nursing/sitter in room   Nurse Communication Other (comment) (Sitter present during session.)        Time: 2595-6387 OT Time Calculation (min): 30 min  Charges: OT General Charges $OT Visit: 1 Procedure OT Treatments $Therapeutic Activity: 23-37 mins  Hortencia Pilar 06/23/2015, 2:08 PM

## 2015-06-24 ENCOUNTER — Inpatient Hospital Stay (HOSPITAL_COMMUNITY): Payer: Medicaid Other

## 2015-06-24 LAB — GLUCOSE, CAPILLARY
GLUCOSE-CAPILLARY: 147 mg/dL — AB (ref 65–99)
Glucose-Capillary: 101 mg/dL — ABNORMAL HIGH (ref 65–99)
Glucose-Capillary: 107 mg/dL — ABNORMAL HIGH (ref 65–99)
Glucose-Capillary: 123 mg/dL — ABNORMAL HIGH (ref 65–99)
Glucose-Capillary: 133 mg/dL — ABNORMAL HIGH (ref 65–99)
Glucose-Capillary: 95 mg/dL (ref 65–99)

## 2015-06-24 LAB — BASIC METABOLIC PANEL
ANION GAP: 7 (ref 5–15)
BUN: 22 mg/dL — ABNORMAL HIGH (ref 6–20)
CO2: 23 mmol/L (ref 22–32)
Calcium: 8 mg/dL — ABNORMAL LOW (ref 8.9–10.3)
Chloride: 109 mmol/L (ref 101–111)
Creatinine, Ser: 0.73 mg/dL (ref 0.61–1.24)
GFR calc Af Amer: >60 mL/min (ref >60)
Glucose, Bld: 104 mg/dL — ABNORMAL HIGH (ref 65–99)
Potassium: 3.6 mmol/L (ref 3.5–5.1)
SODIUM: 139 mmol/L (ref 135–145)

## 2015-06-24 MED ORDER — IOHEXOL 300 MG/ML  SOLN
50.0000 mL | Freq: Once | INTRAMUSCULAR | Status: DC | PRN
  Administered 2015-06-24: 30 mL
  Filled 2015-06-24: qty 50

## 2015-06-24 MED ORDER — HEPARIN SODIUM (PORCINE) 5000 UNIT/ML IJ SOLN
5000.0000 [IU] | Freq: Three times a day (TID) | INTRAMUSCULAR | Status: DC
  Administered 2015-06-25 – 2015-06-27 (×6): 5000 [IU] via SUBCUTANEOUS
  Filled 2015-06-24 (×6): qty 1

## 2015-06-24 MED ORDER — JEVITY 1.5 CAL/FIBER PO LIQD
1000.0000 mL | ORAL | Status: DC
  Administered 2015-06-24 – 2015-06-25 (×2): 1000 mL
  Filled 2015-06-24 (×5): qty 1000

## 2015-06-24 NOTE — Progress Notes (Signed)
STROKE TEAM PROGRESS NOTE   HISTORY Mike Rowe is an 59 y.o. male with no reported past medical history (hasn't been to doctors in years) presents with acute onset of slurred speech and left sided weakness. LSW 2130 on 06/05/2015 when he noted he was unable to stand or use his left side. EMS called, upon arrival noted BP of 240/140. Code stroke activated. CT head imaging reviewed, shows right sided thalamic/BG ICH. Denies taking any blood thinners. He denies any past medical history including hypertension. Denies any surgical history. Denies alcohol or drug abuse. Modified Rankin: 0 ICH Score: 1. Initial NIHSS of 12   SUBJECTIVE (INTERVAL HISTORY):   Patient headed to [place]...   Temp:  [97.5 F (36.4 C)-98 F (36.7 C)] 97.8 F (36.6 C) (08/10 1044) Pulse Rate:  [64-80] 68 (08/10 1044) Cardiac Rhythm:  [-] Normal sinus rhythm (08/10 0900) Resp:  [16-20] 18 (08/10 1044) BP: (123-170)/(77-91) 170/91 mmHg (08/10 1044) SpO2:  [96 %-99 %] 97 % (08/10 1044) Weight:  [69.174 kg (152 lb 8 oz)] 69.174 kg (152 lb 8 oz) (08/10 0500)   Recent Labs Lab 06/23/15 1617 06/23/15 1959 06/24/15 0022 06/24/15 0440 06/24/15 0904  GLUCAP 134* 87 101* 95 107*    Recent Labs Lab 06/19/15 0506 06/20/15 0443 06/21/15 0800 06/22/15 0759 06/23/15 1006 06/24/15 0550  NA 153* 151* 147* 141 139 139  K 3.4* 4.1 4.1 3.6 3.6 3.6  CL 126* 124* 116* 109 109 109  CO2 21* 17* 24 23 23 23   GLUCOSE 135* 170* 123* 176* 129* 104*  BUN 41* 39* 31* 24* 24* 22*  CREATININE 0.96 1.01 0.92 0.76 0.85 0.73  CALCIUM 8.6* 8.3* 8.4* 8.1* 8.3* 8.0*  MG 2.5*  --  2.2  --   --   --   PHOS 3.8  --   --   --   --   --     Recent Labs Lab 06/19/15 0506 06/20/15 0443  AST 236* 173*  ALT 346* 290*  ALKPHOS 58 54  BILITOT 0.7 0.9  PROT 6.2* 5.7*  ALBUMIN 2.7* 2.5*    Recent Labs Lab 06/19/15 0506 06/20/15 0443 06/21/15 0800  WBC 7.0 6.4 7.6  HGB 13.1 12.4* 12.0*  HCT 40.6 39.4 37.8*  MCV 97.1 99.5 99.2  PLT  115* 95* 90*    Recent Labs Lab 06/19/15 1415 06/19/15 1939 06/20/15 1210 06/20/15 1657  TROPONINI 0.09* 0.09* 0.08* 0.07*   No results for input(s): LABPROT, INR in the last 72 hours. No results for input(s): COLORURINE, LABSPEC, [place], GLUCOSEU, HGBUR, BILIRUBINUR, KETONESUR, PROTEINUR, UROBILINOGEN, NITRITE, LEUKOCYTESUR in the last 72 hours.  Invalid input(s): APPERANCEUR     Component Value Date/Time   CHOL 153 06/16/2015 0345   TRIG 176* 06/16/2015 1035   HDL 16* 06/16/2015 0345   CHOLHDL 9.6 06/16/2015 0345   VLDL 40 06/16/2015 0345   LDLCALC 97 06/16/2015 0345   Lab Results  Component Value Date   HGBA1C 5.2 06/07/2015      Component Value Date/Time   LABOPIA NONE DETECTED 06/06/2015 0100   COCAINSCRNUR POSITIVE* 06/06/2015 0100   LABBENZ NONE DETECTED 06/06/2015 0100   AMPHETMU NONE DETECTED 06/06/2015 0100   THCU NONE DETECTED 06/06/2015 0100   LABBARB NONE DETECTED 06/06/2015 0100    No results for input(s): ETH in the last 168 hours.   CT HEAD with CONSTRAST  06/05/2015    Acute right thalamic hemorrhage.    06/06/2015    ( Pre Fall ) Increase in  size of right thalamic/posterior right internal capsule hematoma now with maximal transverse dimension 2.3 x 2.2 cm versus prior 2.1 x 1.6 cm.  Increase surrounding vasogenic edema and local mass effect with mild compression the right lateral ventricle without evidence of midline shift. Interval development of blood within the deep dependent aspect of the right lateral ventricle consistent with breakthrough of the right thalamic hemorrhage into the right lateral ventricle.  06/06/2015  ( Post fall ) Right for thalamic hemorrhage with a small amount of intraventricular blood appears unchanged. No new abnormality is seen.  06/15/2015 Essentially no change compared to recent prior study. Hemorrhagic lesion measuring 4.5 x 3.6 arising from the right basal ganglia with surrounding edema, stable. Midline shift of the  third ventricle toward the left is stable. Slight ventricular prominence with layering hemorrhage in the occipital horns of the lateral ventricles remain without appreciable change. No change in overall ventricular size and configuration. No new infarct compared to recent prior Study.  06/19/15  Limited assessment due to patient positioning within the CT scanner. Evolving RIGHT thalamic hemorrhage with intraventricular extension, stable moderate hydrocephalus.  Carotid Ultrasound Technically limited due to left IJ line, pt position, and movement. Visualized bilaterally the bifurcation, ICA, and ICA. Appears to be 1-39% ICA stenosis.  2D echo - Left ventricle: The cavity size was normal. Wall thickness was increased in a pattern of mild LVH. Systolic function was normal.The estimated ejection fraction was in the range of 60% to 65%.Wall motion was normal; there were no regional wall motionabnormalities. Doppler parameters are consistent with abnormalleft ventricular relaxation (grade 1 diastolic dysfunction). Impressions:  No cardiac source of emboli was indentified.   PHYSICAL EXAM  Temp:  [97.5 F (36.4 C)-98 F (36.7 C)] 97.8 F (36.6 C) (08/10 1044) Pulse Rate:  [64-80] 68 (08/10 1044) Resp:  [16-20] 18 (08/10 1044) BP: (123-170)/(77-91) 170/91 mmHg (08/10 1044) SpO2:  [96 %-99 %] 97 % (08/10 1044) Weight:  [69.174 kg (152 lb 8 oz)] 69.174 kg (152 lb 8 oz) (08/10 0500)  General - Well nourished, well developed, intubated and not following commands.  Ophthalmologic - Fundi not visualized due to noncooperation.  Cardiovascular - Regular rate and rhythm.  Neuro -  on nasal cannula, with increased RR, mild respiratory failure.. Pt opens eyes on voice and follows some simple commands, such as wiggle toes and showing fingers. PERRL, right sided gaze preference, doll's eyes present, positive corneal and gag and cough, breathing over the vent. On pain stimulation, left UE and LE withdraw  to pain, right UE and LE spontaneous movement. LUE increased muscle tone. Babinski positive on the left. DTR decreased on the left. Sensation, coordination and gait not tested.   ASSESSMENT [name] is a 59 y.o. male with no significant past medical history no regular medical follow-up presenting with left hemiparesis really elevated blood pressures. He did not receive IV t-PA due to acute right thalamic hemorrhage.   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  2D Echo - unremarkable  LDL 97  HgbA1c 5.2  SCDs for VTE prophylaxis Diet NPO time specified.   no antithrombotic prior to admission  Ongoing aggressive stroke risk factor management  Therapy recommendations: SNF recommended  Disposition:  Pending. Medically ready for discharge once feeding route established. Dr. Leonie Man discussed  with SW. (has family but cannot find) SW has secured a bed. SW team pursuing guardianship. Await tube placement.  Patient does not have capacity to make own decisions.  Given R brain stroke, pt is impulsive, inconsistently follows 1-step commands, poor awareness of environment, decreased awareness of deficits (neglect), psychomotor delay, impaired communication and lethargy  Cerebral edema Induced Hypernatremia  Off 3% saline   Na normalized  Cocaine abuse  Positive on UDS  Malignant Hypertension  Home meds:  No antihypertensives medications prior to admission  BP 217/123 on arrival  On norvasc, clonidine, hydralazine, labetalol  Continues to improve  Hyperlipidemia  LDL 97  No home meds  Hold off statins due to elevated liver enzymes.  Tachypnea/respiratory distress/acute respiratory failure  On 8/5 pt w/ O2 desat with tachypnea  Suspect the patient has  aspiration pneumonitis given his encephalopathy and dysphagia  BNP t149.3 (H)  CXR - low lung volumes with mild bibasilar atelectasis - stable  Lactic acid 1.6 within normal range  Troponin 0.09 (H) - every 6 hours 3 - mildly elevated 3  CMP - sodium 153- potassium 3.4- chloride- 126 CO2- 21 BUN- 41 creatinine- 0.96 AST- 236 ALT- 346  CBC - WBCs 7.0  Treated with Unasyn, D#5/7 - recommend change to augmentin at d/c (if applicable)  Stable now  Dysphagia, malnutrition  Continue NG tube feedings   Speech therapy recommends PEG  Trauma consulted for PEG - plan placement 06/25/2015 due to scheduling issues  Will need 2 MDs to agree placement necessary given lack of family for consent  Other Stroke Risk Factors  Cigarette smoking  alcohol use  Cocaine use  Other Active Problems  Thrombocytopenia  Anxiety / agitation  Mildly elevated BUN  Transaminasemia secondary to chronic hepatitis C   Other Pertinent History  No family contact yet  Hospital day # [place] for Pager information 06/24/2015 12:10 PM  I have personally examined this patient, reviewed notes, independently viewed imaging studies, participated in medical decision making and plan of care. I have made any additions or clarifications directly to the above note. Agree with note above.  Plan PEG tube tomorrow and SNF transfer in next 2-3 days  Antony Contras, [facility] Pager: [phone] 06/24/2015 12:10 PM  06/24/2015 12:10 PM To contact Stroke Continuity provider, please refer to [email or web address] After hours, contact General Neurology

## 2015-06-24 NOTE — Progress Notes (Signed)
Patient ID: Mike Rowe, male   DOB: 1956/03/08, 59 y.o.   MRN: [id number]  LOS: 19 days   Subjective: Pt going for MBS.    Objective: Vital signs in last 24 hours: Temp:  [97.5 F (36.4 C)-98.2 F (36.8 C)] 97.8 F (36.6 C) (08/10 0623) Pulse Rate:  [64-80] 72 (08/10 0623) Resp:  [16-24] 20 (08/10 0623) BP: (123-148)/(77-84) 143/83 mmHg (08/10 0623) SpO2:  [94 %-99 %] 96 % (08/10 0623) Weight:  [69.174 kg (152 lb 8 oz)] 69.174 kg (152 lb 8 oz) (08/10 0500) Last BM Date: 06/23/15  Lab Results:  CBC No results for input(s): WBC, HGB, HCT, PLT in the last 72 hours. BMET  Recent Labs  06/23/15 1006 06/24/15 0550  NA 139 139  K 3.6 3.6  CL 109 109  CO2 23 23  GLUCOSE 129* 104*  BUN 24* 22*  CREATININE 0.85 0.73  CALCIUM 8.3* 8.0*    Imaging: No results found.   PE: General appearance: alert GI: soft, non-tender; bowel sounds normal; no masses,  no organomegaly   Assessment/Plan:   Patient Active Problem List   Diagnosis Date Noted  . Dysphagia   . Aspiration into airway   . Encounter for feeding tube placement   . Stroke due to intracerebral hemorrhage   . Tobacco abuse   . Cocaine abuse   . HTN (hypertension), malignant   . Elevated liver enzymes   . Nontraumatic subcortical hemorrhage of cerebral hemisphere   . Altered level of consciousness   . Atelectasis   . Acute respiratory failure with hypoxia   . Alcohol dependence with uncomplicated withdrawal   . ICH (intracerebral hemorrhage) 06/05/2015     Assessment/Plan: Right thalamic stroke Hypertension Hx substance abuse Dysphagia    Will await modified barium swallow Consent for procedure--we need 2 physicians to state necessity for PEG.  D/w Dr. Leonie Man yesterday.  Plan for PEG tomorrow at [facility], may be able to do it earlier in the day.  Will therefore hold his TF after midnight tonight. Hold 8/11 AM dose of heparin On Unasyn   Erby Pian, ANP-BC Pager: [place] PA Pager:  [phone]   06/24/2015 9:29 AM

## 2015-06-24 NOTE — Progress Notes (Signed)
Speech Language Pathology Treatment: Cognitive-Linquistic;Dysphagia  Patient Details Name: Mike Rowe MRN: [id number] DOB: [birth year] Today's Date: 06/24/2015 Time: 3151-7616 SLP Time Calculation (min) (ACUTE ONLY): 20 min  Assessment / Plan / Recommendation Clinical Impression  Goal of treatment session was to address dysphagia goals; however patient ultimately declined ice chips today and as a result, session's focus was on communication.  Patient was able to follow 1-step commands within the context of a task (oral care) with Mod multimodal cues.  Patient grimacing during treatment and was able to nod head yes to pain and identify location with right upper extremity.  Patient also able to identify 5/5 named body parts with right upper extremity today.  Via yes/no questions patient was able to identify he was at [facility] due to a stroke.  Patient unable to nod head no during today's session but expressed this by putting his right hand up in a stop or shooing motion.  Appropriate no gestures were used with different hospital name and diagnosis and well as when ice chips were offered.  Per chart review patient pending PEG placement for tomorrow.  Continue with current goals and plan of care.     HPI Other Pertinent Information: Mike Rowe is an 59 y.o. male with no reported past medical history (hasn't been to doctors in years) presents with acute onset of slurred speech and left sided weakness. LSW 2130 when he noted he was unable to stand or use his left side. EMS called, upon arrival noted BP of 240/140. Code stroke activated. CT head imaging reviewed, shows right sided thalamic/BG ICH. Denies taking any blood thinners. Pt was intubated 7/26-8/2.   Pertinent Vitals Pain Assessment:  (gestures headache) Pain Location: gestures head Pain Descriptors / Indicators: Grimacing Pain Intervention(s): Patient requesting pain meds-RN notified  SLP Plan  Continue with current plan of care     Recommendations Diet recommendations: NPO              Oral Care Recommendations: Oral care QID Follow up Recommendations: Skilled Nursing facility;24 hour supervision/assistance Plan: Continue with current plan of care    GO    Carmelia Roller., CCC-SLP [phone]  [place] 06/24/2015, 2:51 PM

## 2015-06-24 NOTE — Progress Notes (Signed)
Physical Therapy Treatment Patient Details Name: Mike Rowe MRN: [id number] DOB: 1956/04/04 Today's Date: 06/24/2015    History of Present Illness Mike Rowe is an 59 y.o. male with no reported past medical history (hasn't been to doctors in years) presents with acute onset of slurred speech and left sided weakness. LSW 2130 when he noted he was unable to stand or use his left side. EMS called, upon arrival noted BP of 240/140. Code stroke activated. CT head imaging reviewed, shows right sided thalamic/BG ICH. 7/26 Transfer to the ICU for intubation and 3% saline. Extubated 8/2    PT Comments    Pt tolerated OOB to chair this date but con't to have minimal voluntary mvmt. Pt did demo increased command follow with R UE and nodding of head to answer questions. Pt's head in R lateral rotation contraction and L UE/LE remains flaccid. con't to recommend SNF upon d/c as pt unable to care for self at this time.   Follow Up Recommendations  SNF;Supervision/Assistance - 24 hour     Equipment Recommendations  None recommended by PT    Recommendations for Other Services       Precautions / Restrictions Precautions Precautions: Fall Precaution Comments: L hemi, strong L lateral lean, R mit, bleeding from nose Restrictions Weight Bearing Restrictions: No    Mobility  Bed Mobility Overal bed mobility: +2 for physical assistance;Needs Assistance Bed Mobility: Supine to Sit;Sit to Supine Rolling: Total assist   Supine to sit: Total assist;+2 for physical assistance;HOB elevated Sit to supine: Total assist;+2 for physical assistance;HOB elevated Sit to sidelying: Max assist;+2 for physical assistance;+2 for safety/equipment General bed mobility comments: no voluntary mvmt, unable to use L UE/LE functionally  Transfers Overall transfer level: Needs assistance Equipment used:  (2 person dependent lift) Transfers: Sit to/from [facility] Sit to Stand: +2 physical  assistance;Total assist (3rd person for equipment) Stand pivot transfers: Total assist;+2 physical assistance;+2 safety/equipment       General transfer comment: pt with no assist at all  Ambulation/Gait                 Stairs            Wheelchair Mobility    Modified Rankin (Stroke Patients Only) Modified Rankin (Stroke Patients Only) Pre-Morbid Rankin Score: No symptoms Modified Rankin: Severe disability     Balance Overall balance assessment: Needs assistance Sitting-balance support: Feet supported;Single extremity supported Sitting balance-Leahy Scale: Zero Sitting balance - Comments: strong L lateral lean, unable to bring self to midline Postural control: Left lateral lean   Standing balance-Leahy Scale: Zero                      Cognition Arousal/Alertness: Lethargic Behavior During Therapy: Flat affect (was movign R arm agressively and then had BM) Overall Cognitive Status: Impaired/Different from baseline         Following Commands: Follows one step commands with increased time (followed 1 step commands with R UE 50% of time) Safety/Judgement: Decreased awareness of safety;Decreased awareness of deficits   Problem Solving: Slow processing;Decreased initiation;Difficulty sequencing;Requires verbal cues General Comments: pt keeps head turned to R side, stiffness setting in    Exercises      General Comments General comments (skin integrity, edema, etc.): pt had episodes of loose stool while sitting EOB, total assist for hygiene      Pertinent Vitals/Pain Pain Assessment:  (unable to report, but no grimacing during mvmt)    Home Living  Prior Function            PT Goals (current goals can now be found in the care plan section) Acute Rehab PT Goals Patient Stated Goal: unable Progress towards PT goals: Progressing toward goals    Frequency  Min 3X/week    PT Plan Current plan remains  appropriate    Co-evaluation             End of Session Equipment Utilized During Treatment: Gait belt Activity Tolerance: Patient tolerated treatment well Patient left: in chair;with call bell/phone within reach;with nursing/sitter in room     Time: 1139-1222 PT Time Calculation (min) (ACUTE ONLY): 43 min  Charges:  $Therapeutic Activity: 38-52 mins                    G Codes:      Mike Rowe 06/24/2015, 1:42 PM   Mike Rowe, PT, DPT Pager #: [phone] Office #: [phone]

## 2015-06-24 NOTE — Clinical Social Work Note (Signed)
CSW informed by CSW Surveyor, quantity that guardianship is being pursued. CSW spoke with Mike Rowe at [facility]. Mike Rowe confirmed that he can accept the pt. CSW informed CSW Surveyor, quantity.   [place], MSW, [place]

## 2015-06-24 NOTE — Progress Notes (Signed)
PROGRESS NOTE  Mike Rowe UVO:[id number] DOB: 1956-07-03 DOA: 06/05/2015 PCP: No primary care provider on file.  Brief history 59 y.o. male with no reported past medical history (hasn't been to doctors in years) presents with acute onset of slurred speech and left sided weakness. LSW 2130 when he noted he was unable to stand or use his left side. EMS called, upon arrival noted BP of 240/140. Code stroke activated. CT head imaging reviewed, shows right sided thalamic/BG ICH.   Assessment/Plan: Tachypnea/respiratory distress/acute respiratory failure -On 06/19/2015, the patient had oxygen desaturation with tachypnea -Suspect the patient has aspiration pneumonitis given his encephalopathy and dysphagia -Also suspect a component of Cheyne-Stokes respirations -06/19/2015 CT angiogram chest negative for pulmonary embolus or pulmonary edema -Does not appear to be clinically fluid overloaded -Presenlty stable on 2 L nasal cannula--> 98% -Discontinue vancomycin -Discontinue Fortaz -Start Unasyn D#5 of 7; can switch to augmentin when ready to d/c -Broncho-dilators -Lactic acid 1.8 -06/22/15--a little more alert--intermittenly follows one-step commands  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 and in setting of Cocaine use -Management per neurology service -LDL 97 -Hemoglobin A1c 5.2 -Patient has residual left hemiparesis -He is nonverbal at this time but intermittenly follows one step commands  Dysphagia -continue enteral feedings via Panda tube -06/23/15--pt pulled out Panda tube-->reconsulted IR to replace -plans for PEG on 06/25/15 if able to get consent/guardianship  Encephalopathy -Secondary to hemorrhagic stroke -More alert since correction of his electrolytes --He is nonverbal at this time but intermittenly follows one step commands  Hypernatremia -Start hypotonic fluid-->improving -more alert with improvement -8/816--saline lock and  observe -Continue free water flushes with enteral feedings  Hypertension -Continue clonidine, labetalol, hydralazine, and amlodipine  Polysubstance abuse -Including cocaine, alcohol, tobacco  Transaminasemia -Likely secondary to chronic hepatitis C--positive qualitative RNA  Hyperlipidemia -Hold statin at this time in the setting of elevated LFTs  Elevated troponin -Secondary to demand ischemia -Doubt ACS -EKG with ST depression insistent with LVH with repolarization abnormalities   Patient has safety sitter   Family Communication: Pt at beside Disposition Plan: Remain inpt. Will need SNF when stable     Procedures/Studies: Ct Head Wo Contrast  06/18/2015   CLINICAL DATA:  Follow-up intracranial hemorrhage. History of hypertension, cocaine abuse, alcohol dependence.  EXAM: CT HEAD WITHOUT CONTRAST  TECHNIQUE: Contiguous axial images were obtained from the base of the skull through the vertex without intravenous contrast.  COMPARISON:  CT head June 15, 2015 and CT head June 05, 2015  FINDINGS: Patient is obliqued in the scanner, limiting evaluation.  4.6 x 3.3 cm evolving RIGHT thalamus intraparenchymal hematoma with surrounding low-density vasogenic edema. Intraventricular extension with small amount of dependent blood products in the occipital horns. Partially effaced RIGHT temporal horn, stable moderate hydrocephalus with mild interstitial edema. Local mass effect, limited assessment for midline shift due to patient positioning. No acute large vascular territory infarct.  No abnormal extra-axial fluid collections. Stable density along the falx most consistent with calcification. Basal cisterns appear patent though, decreased by positioning. Moderate calcific atherosclerosis the carotid siphons. No skull fracture. Nasogastric tube in place.  IMPRESSION: Limited assessment due to patient positioning within the CT scanner.  Evolving RIGHT thalamic hemorrhage with intraventricular  extension, stable moderate hydrocephalus.   Electronically Signed   By: Elon Alas M.D.   On: 06/18/2015 23:54   Ct Head Wo Contrast  06/15/2015   CLINICAL DATA:  Intracranial hemorrhage  EXAM: CT HEAD WITHOUT CONTRAST  TECHNIQUE: Contiguous axial images were obtained from the base of the skull through the vertex without intravenous contrast.  COMPARISON:  June 12, 2015  FINDINGS: The hemorrhage arising from the right basal ganglia region measures 4.5 x 3.6 cm, essentially unchanged. There is surrounding vasogenic type edema. This hemorrhagic lesion impresses upon the third ventricle, deviating the third ventricle 8 mm from the midline toward the left. This degree of midline shift of the third ventricle is not appreciably changed. There is hemorrhage extending into the occipital horn of each lateral ventricle with layering hemorrhage in the occipital horns of the lateral ventricles, not significantly changed. The lateral ventricles remain borderline prominent but stable. Fourth ventricle is in the midline.  There is no new gray-white compartment lesion compared to recent prior study.  Bony calvarium appears intact.  The mastoid air cells are clear.  There is moderate edema inferior to the hemorrhage in the medial right temporal lobe, stable.  IMPRESSION: Essentially no change compared to recent prior study. Hemorrhagic lesion measuring 4.5 x 3.6 arising from the right basal ganglia with surrounding edema, stable. Midline shift of the third ventricle toward the left is stable. Slight ventricular prominence with layering hemorrhage in the occipital horns of the lateral ventricles remain without appreciable change. No change in overall ventricular size and configuration. No new infarct compared to recent prior study.   Electronically Signed   By: Lowella Grip III M.D.   On: 06/15/2015 07:08   Ct Head Wo Contrast  06/12/2015   CLINICAL DATA:  Followup hemorrhagic stroke.  EXAM: CT HEAD WITHOUT CONTRAST   TECHNIQUE: Contiguous axial images were obtained from the base of the skull through the vertex without intravenous contrast.  COMPARISON:  CT head June 09, 2015  FINDINGS: RIGHT thalamic/basal ganglia 4.5 x 3.5 cm dense intraparenchymal hematoma is unchanged in size, surrounding low-density vasogenic edema. Again noted is intraventricular extension with layering blood products in the occipital horn. Local mass effect, partially effaced lateral ventricle without obstructive hydrocephalus.  No acute large vascular territory infarct. Basal cisterns are patent. Mild calcific atherosclerosis of the carotid siphons.  Ocular globes and orbital contents are nonsuspicious, mild enophthalmos suspected. Visualized paranasal sinuses and mastoid air cells are well aerated.  IMPRESSION: Evolving RIGHT basal ganglia/ thalamic intraparenchymal hematoma with similar intraventricular extension and local mass effect. No hydrocephalus.   Electronically Signed   By: Elon Alas M.D.   On: 06/12/2015 03:00   Ct Head Wo Contrast  06/09/2015   CLINICAL DATA:  Right facial droop. Right intraparenchymal hemorrhage.  EXAM: CT HEAD WITHOUT CONTRAST  TECHNIQUE: Contiguous axial images were obtained from the base of the skull through the vertex without intravenous contrast.  COMPARISON:  CT scan of June 08, 2015.  FINDINGS: Bony calvarium appears intact. The intraparenchymal hemorrhage seen in the right thalamus and basal ganglia on prior exam is significantly enlarged currently, measuring 4.6 x 3.4 cm. There is an increased amount of hemorrhage seen within the right lateral ventricle. Small amount of hemorrhage is also seen in the left posterior horn which is new since prior exam. There appears to be hemorrhage in the third ventricle as well. Slightly increased left lateral ventricular dilatation is noted. There is approximately 6 mm of left-to-right midline shift which is increased compared to prior exam. Fourth ventricle appears  normal.  IMPRESSION: Intraparenchymal hemorrhage seen in right thalamus and basal ganglia on prior exam is significantly enlarged, with increased amount of hemorrhage seen within the right lateral ventricle. Small amount  of hemorrhage is now noted in the left lateral and third ventricles, with increased dilatation of the left lateral ventricle. These results will be called to the ordering clinician or representative by the Radiologist Assistant, and communication documented in the PACS or zVision Dashboard.   Electronically Signed   By: Marijo Conception, M.D.   On: 06/09/2015 10:08   Ct Head Wo Contrast  06/08/2015   CLINICAL DATA:  Intracranial hemorrhage.  Sudden behavioral change.  EXAM: CT HEAD WITHOUT CONTRAST  TECHNIQUE: Contiguous axial images were obtained from the base of the skull through the vertex without intravenous contrast.  COMPARISON:  06/07/2015 and 06/05/2015  FINDINGS: The right thalamic hemorrhage has increased in size. It currently measures 3.2 x 2.3 cm and previously measured 2.3 x 2.2 cm. There is mildly worsened mass effect on the third ventricle. There is slight increase in volume of intraventricular blood within the right occipital horn.  No other significant interval changes are evident. No extra-axial blood is evident.  IMPRESSION: Increased size of the right thalamic hemorrhage with worsened mass effect on the third ventricle. These results were called by telephone at the time of interpretation on 06/08/2015 at 2:41 am to the patient's nurse, who verbally acknowledged these results.   Electronically Signed   By: Andreas Newport M.D.   On: 06/08/2015 02:41   Ct Head Wo Contrast  06/07/2015   CLINICAL DATA:  59 year old with intracranial hemorrhage. Follow-up. Subsequent encounter.  EXAM: CT HEAD WITHOUT CONTRAST  TECHNIQUE: Contiguous axial images were obtained from the base of the skull through the vertex without intravenous contrast.  COMPARISON:  06/04/2013.  FINDINGS: Increase  in size of right thalamic/posterior right internal capsule hematoma now with maximal transverse dimension 2.3 x 2.2 cm versus prior 2.1 x 1.6 cm. Increase surrounding vasogenic edema and local mass effect with mild compression the right lateral ventricle without evidence of midline shift.  Interval development of blood within the deep dependent aspect of the right lateral ventricle consistent with breakthrough of the right thalamic hemorrhage into the right lateral ventricle.  No CT evidence of large acute thrombotic infarct.  No intracranial mass lesion separate from above described findings.  IMPRESSION: Increase in size of right thalamic/posterior right internal capsule hematoma now with maximal transverse dimension 2.3 x 2.2 cm versus prior 2.1 x 1.6 cm. Increase surrounding vasogenic edema and local mass effect with mild compression the right lateral ventricle without evidence of midline shift.  Interval development of blood within the deep dependent aspect of the right lateral ventricle consistent with breakthrough of the right thalamic hemorrhage into the right lateral ventricle.  These results were called by telephone at the time of interpretation on 06/07/2015 at 7:31 am to [facility], patient's nurse who verbally acknowledged these results.   Electronically Signed   By: Genia Del M.D.   On: 06/07/2015 07:31   Ct Head Wo Contrast  06/07/2015   CLINICAL DATA:  Status post fall last night. Patient diagnosed with intracranial hemorrhage 06/05/2015.  EXAM: CT HEAD WITHOUT CONTRAST  TECHNIQUE: Contiguous axial images were obtained from the base of the skull through the vertex without intravenous contrast.  COMPARISON:  Head CT scan 06/05/2015.  FINDINGS: Hemorrhage in the right thalamus is again seen. There is also a small amount of intraventricular blood. The appearance is unchanged. No new abnormality including new hemorrhage, infarct, mass lesion, midline shift or abnormal extra-axial fluid collection is  identified. No hydrocephalus or pneumocephalus. The calvarium is intact.  IMPRESSION: Right  for thalamic hemorrhage with a small amount of intraventricular blood appears unchanged. No new abnormality is seen.   Electronically Signed   By: Inge Rise M.D.   On: 06/07/2015 07:27   Ct Head Wo Contrast  06/05/2015   CLINICAL DATA:  59 year old male with left-sided weakness. Code stroke.  EXAM: CT HEAD WITHOUT CONTRAST  TECHNIQUE: Contiguous axial images were obtained from the base of the skull through the vertex without intravenous contrast.  COMPARISON:  None.  FINDINGS: There is a 2.1 x 1.6 cm acute intraparenchymal hemorrhage centered at the right thalamus. There is mild adjacent edema. No significant mass effect. No midline shift.  The ventricles and the sulci are appropriate in size for the patient's age. The The gray-white matter differentiation is preserved.  The visualized paranasal sinuses and mastoid air cells are well aerated. The calvarium is intact.  IMPRESSION: Acute right thalamic hemorrhage.  Critical Value/emergent results were called by telephone at the time of interpretation on 06/05/2015 at 10:39 pm to Dr. Janann Colonel , who verbally acknowledged these results.   Electronically Signed   By: Anner Crete M.D.   On: 06/05/2015 22:41   Ct Angio Chest Pe W/cm &/or Wo Cm  06/19/2015   CLINICAL DATA:  Shortness of Breath  EXAM: CT ANGIOGRAPHY CHEST WITH CONTRAST  TECHNIQUE: Multidetector CT imaging of the chest was performed using the standard protocol during bolus administration of intravenous contrast. Multiplanar CT image reconstructions and MIPs were obtained to evaluate the vascular anatomy.  CONTRAST:  9mL OMNIPAQUE IOHEXOL 350 MG/ML SOLN  COMPARISON:  Chest radiograph June 19, 2015  FINDINGS: There is no demonstrable pulmonary embolus. There is atherosclerotic change in the aortic arch region. There is no thoracic aortic aneurysm or dissection. The visualized great vessels appear  unremarkable.  There is patchy bibasilar atelectatic change. There is no frank edema or consolidation. There is a small bulla in the right upper lobe peripherally near the apex.  On axial slice 37 series 6, there is a 7 mm nodular opacity along the minor fissure laterally on the right.  Thyroid appears normal. There is no appreciable thoracic adenopathy.  There are foci of coronary artery calcification. There is left ventricular hypertrophy. The pericardium is not thickened.  In the visualized upper abdomen, there are several scattered subcentimeter liver cysts. The liver contour appears rather nodular. There is a feeding tube extending into the stomach ; tip is not seen an is below the level of imaging.  There are no blastic or lytic bone lesions.  Review of the MIP images confirms the above findings.  IMPRESSION: No demonstrable pulmonary embolus.  Patchy bibasilar atelectasis.  No frank edema or consolidation.  7 mm nodular opacity along the minor fissure on the right laterally. Followup of this nodular opacity should be based on Fleischner Society guidelines. If the patient is at high risk for bronchogenic carcinoma, follow-up chest CT at 3-51months is recommended. If the patient is at low risk for bronchogenic carcinoma, follow-up chest CT at 6-12 months is recommended. This recommendation follows the consensus statement: Guidelines for Management of Small Pulmonary Nodules Detected on CT Scans: A Statement from the [employer] as published in Radiology 2005; 237:395-400.  No appreciable adenopathy.  Foci of coronary artery calcification. There is left ventricular hypertrophy.  Liver contour appears rather nodular. Question a degree of underlying hepatic cirrhosis.   Electronically Signed   By: Lowella Grip III M.D.   On: 06/19/2015 15:30   Dg Chest [facility]  06/19/2015   CLINICAL DATA:  Respiratory distress.  EXAM: PORTABLE CHEST - 1 VIEW  COMPARISON:  One-view chest 06/18/2015  FINDINGS: The  heart size is exaggerated by low lung volumes. Mild pulmonary vascular congestion is again noted. Minimal right basilar atelectasis is present. The left IJ line has been removed. There is no pneumothorax. A feeding tube courses off the inferior border of the film.  IMPRESSION: 1. Persistent low lung volumes exaggerating the heart size. 2. Mild bibasilar atelectasis is stable. 3. Interval removal of left IJ line without complication.   Electronically Signed   By: San Morelle M.D.   On: 06/19/2015 11:44   Dg Chest Port 1 View  06/18/2015   CLINICAL DATA:  Subsequent evaluation airway aspiration  EXAM: PORTABLE CHEST - 1 VIEW  COMPARISON:  06/15/2015  FINDINGS: Endotracheal tube has been removed. Feeding tube is seen extending off the inferior edge of the film. No change left IJ line.  Mild cardiac enlargement. Vascular pattern normal. Mild airspace disease in the medial lung bases bilaterally.  IMPRESSION: Mild bibasilar medial airspace disease most consistent with atelectasis.   Electronically Signed   By: Skipper Cliche M.D.   On: 06/18/2015 11:52   Dg Chest Port 1 View  06/15/2015   CLINICAL DATA:  Acute respiratory failure, hypoxia, intercerebral hemorrhage, alcoholic dependence and withdrawal  EXAM: PORTABLE CHEST - 1 VIEW  COMPARISON:  Chest x-ray of June 14, 2015  FINDINGS: The lungs are well-expanded and clear. The heart and pulmonary vascularity are normal. The mediastinum is normal in width. The endotracheal tube tip lies 4.7 cm above the carina. The esophagogastric tube tip projects below the inferior margin of the image. The left internal jugular venous catheter tip projects over the midportion of the SVC. The bony thorax is unremarkable.  IMPRESSION: There is no active cardiopulmonary disease.   Electronically Signed   By: David  Martinique M.D.   On: 06/15/2015 07:35   Dg Chest Port 1 View  06/14/2015   CLINICAL DATA:  Acute respiratory failure with hypoxia  EXAM: PORTABLE CHEST - 1 VIEW   COMPARISON:  Radiograph 06/12/2015  FINDINGS: Endotracheal tube and NG tube are unchanged. LEFT central venous line unchanged. Normal cardiac silhouette. No pulmonary edema. No infiltrate. No pneumothorax.  IMPRESSION: 1. Stable support apparatus. 2. No interval change.   Electronically Signed   By: Suzy Bouchard M.D.   On: 06/14/2015 09:49   Dg Chest Port 1 View  06/12/2015   CLINICAL DATA:  Intubated patient, history of intracranial hemorrhage, alcohol withdrawal, acute respiratory failure and hypoxia.  EXAM: PORTABLE CHEST - 1 VIEW  COMPARISON:  Portable chest x-ray of June 11, 2015  FINDINGS: The right lung is adequately inflated and clear. On the left the retrocardiac region remains dense. There remains partial obscuration of the hemidiaphragm. The heart is normal in size. The pulmonary vascularity is not engorged. The endotracheal tube tip lies 3.8 cm above the carina. The esophagogastric tube tip projects below the inferior margin of the image. The left internal jugular venous catheter tip projects over the midportion of the SVC.  IMPRESSION: Stable appearance of the chest since yesterday's study. There is persistent left lower lobe atelectasis. The support tubes are in reasonable position.   Electronically Signed   By: David  Martinique M.D.   On: 06/12/2015 07:56   Dg Chest Port 1 View  06/11/2015   CLINICAL DATA:  Atelectasis described on previous chest x-ray report. Clinical data from recent chest x-ray indicating acute  respiratory failure with hypoxia and alcohol withdrawal.  EXAM: PORTABLE CHEST - 1 VIEW  COMPARISON:  Chest x-ray dated 06/10/2015.  FINDINGS: Lungs are again hyperexpanded suggesting COPD. Dense opacity at the left lung base is unchanged. Lungs otherwise clear. No pleural effusion visualized. No pneumothorax.  Endotracheal tube remains well positioned with tip just above the level of the carina. Nasogastric tube passes below the diaphragm. A left-sided internal jugular central line  appears adequately positioned with tip in the superior vena cava.  IMPRESSION: 1. Dense opacity at the left lung base without significant interval change. This is compatible with previous chest x-ray report description of atelectasis. If febrile, pneumonia could not be completely excluded. 2. Lungs again hyperexpanded suggesting some degree of COPD. 3. No new lung findings. 4. Endotracheal tube and nasogastric tube appear well positioned.   Electronically Signed   By: Franki Cabot M.D.   On: 06/11/2015 08:10   Dg Chest Port 1 View  06/10/2015   CLINICAL DATA:  Intubated patient, acute respiratory failure with hypoxia, alcohol withdrawal  EXAM: PORTABLE CHEST - 1 VIEW  COMPARISON:  Portable chest x-ray of June 08, 2014  FINDINGS: The lungs remain hyperinflated. There remain coarse infrahilar lung markings bilaterally consistent with subsegmental atelectasis. There is no pneumothorax or significant pleural effusion. The cardiac silhouette is normal in size. The pulmonary vascularity is not engorged.  The endotracheal tube tip lies 4 cm above the carina. The esophagogastric tube tip projects below the inferior margin of the image. The left internal jugular venous catheter tip projects over the midportion of the SVC  IMPRESSION: 1. COPD. Improving left lower lobe atelectasis. There is no CHF or pneumonia. 2. The support tubes are in reasonable position.   Electronically Signed   By: David  Martinique M.D.   On: 06/10/2015 08:02   Dg Chest Port 1 View  06/09/2015   CLINICAL DATA:  Hypoxia  EXAM: PORTABLE CHEST - 1 VIEW  COMPARISON:  June 08, 2015  FINDINGS: Endotracheal tube tip is 5.2 cm above the carina. Central catheter tip is in the superior vena cava. No pneumothorax. There is consolidation in the left lower lobe. Small left effusion. The lungs are otherwise clear. Heart size and pulmonary vascularity are normal. No adenopathy. No bone lesions.  IMPRESSION: Tube and catheter positions as described without  pneumothorax. Consolidation left lower lobe with small left effusion.   Electronically Signed   By: Lowella Grip III M.D.   On: 06/09/2015 13:29   Dg Chest Port 1 View  06/08/2015   CLINICAL DATA:  Tobacco abuse.  Intracranial hemorrhage.  EXAM: PORTABLE CHEST - 1 VIEW  COMPARISON:  None.  FINDINGS: The heart size is exaggerated by low lung volumes. Mild pulmonary vascular congestion is evident. No focal airspace disease is present. The radiopaque line is noted over the left neck. I spoke with the patient's nurse. The patient does not have intravenous ine in the left neck. This may be a peripheral IV line projecting over the patient. The visualized soft tissues and bony thorax are unremarkable otherwise.  IMPRESSION: Low lung volumes and mild pulmonary vascular congestion.   Electronically Signed   By: San Morelle M.D.   On: 06/08/2015 07:47   Dg Abd Portable 1v  06/18/2015   CLINICAL DATA:  Feeding tube replacement.  EXAM: PORTABLE ABDOMEN - 1 VIEW  COMPARISON:  One-view abdomen 06/17/2015.  FINDINGS: A single view the abdomen demonstrates the tip the small bore feeding tube in the distal stomach. The  bowel gas pattern is otherwise unremarkable. The lung bases are clear.  IMPRESSION: The tip of a small bore feeding tube is in the distal stomach.   Electronically Signed   By: San Morelle M.D.   On: 06/18/2015 07:21   Dg Abd Portable 1v  06/17/2015   CLINICAL DATA:  Enteric tube placement.  EXAM: PORTABLE ABDOMEN - 1 VIEW  COMPARISON:  Earlier 06/17/2015.  FINDINGS: Exam demonstrates an enteric feeding tube with tip over the stomach in the left mid abdomen. Bowel gas pattern is nonobstructive. Remainder of the exam is unchanged.  IMPRESSION: No acute findings.  Enteric tube with tip over the stomach in the left mid abdomen.   Electronically Signed   By: Marin Olp M.D.   On: 06/17/2015 16:42   Dg Abd Portable 1v  06/17/2015   CLINICAL DATA:  Feeding tube placement  EXAM: PORTABLE  ABDOMEN - 1 VIEW  COMPARISON:  June 09, 2015  FINDINGS: Feeding tube tip is in the stomach. Bowel gas pattern unremarkable. No obstruction or free air is seen on this supine examination. Lung bases are clear.  IMPRESSION: Feeding tube tip in stomach.  Bowel gas pattern unremarkable.   Electronically Signed   By: Lowella Grip III M.D.   On: 06/17/2015 12:36   Dg Abd Portable 1v  06/09/2015   CLINICAL DATA:  Orogastric tube placement  EXAM: PORTABLE ABDOMEN - 1 VIEW  COMPARISON:  None.  FINDINGS: Orogastric tube tip is in the proximal stomach. The side port is at the gastroesophageal junction. There is contrast in the distal half of the colon. There is no bowel dilatation or air-fluid level suggesting obstruction. No free air is seen on this supine examination.  IMPRESSION: Orogastric tube tip in proximal stomach. Side-port is at the gastroesophageal junction. Advise advancing tube approximately 6 cm to confirm that tube tip and side port are both well within the stomach. Overall bowel gas pattern unremarkable.   Electronically Signed   By: Lowella Grip III M.D.   On: 06/09/2015 16:34   Dg Swallowing Func-speech Pathology  06/06/2015    Objective Swallowing Evaluation: Modified Barium Swallow Study    Patient Details  Name: Mike Rowe MRN: [id number] Date of Birth: [birth year]  Today's Date: 06/06/2015 Time: SLP Start Time (ACUTE ONLY): 1245-SLP Stop Time (ACUTE ONLY): 1306 SLP Time Calculation (min) (ACUTE ONLY): 21 min  Past Medical History: No past medical history on file. Past Surgical History: No past surgical history on file. HPI:  Other Pertinent Information: Dyke Weible is an 59 y.o. male with no reported  past medical history (hasn't been to doctors in years) presents with acute  onset of slurred speech and left sided weakness. LSW 2130 when he noted he  was unable to stand or use his left side. EMS called, upon arrival noted  BP of 240/140. Code stroke activated. CT head imaging reviewed, shows   right sided thalamic/BG ICH. Denies taking any blood thinners  No Data Recorded  Assessment / Plan / Recommendation CHL IP CLINICAL IMPRESSIONS 06/06/2015  Therapy Diagnosis Mild to Moderate pharyngeal phase dysphagia;Moderate  oral phase dysphagia;  Clinical Impression Pt exhibiting moderate oral pharyngeal dysphagia which  is exacerbated by his impulsivity and agitation. Silent aspiration noted  when patient rapidly consumed multiple consecutive cup sips of thin  liquids. Trace aspirates remained below true vocal cords despite cueing  for voilitional cough. No penetration or aspiration noted in further  trials of thin liquid by cup and straw when sip  size and rate where better  controlled. Pts poor condition of dentition along with residual  left  sided decreased lingual and labial sensation, decreased left sided   Lingual and labial range of motion prohibit effective mastication of solid  POs. Recommend dysphagia 2 diet and thin liquids with strict aspiration  precautions. Medicines whole with thin liquid. Full supervision  recommended for adequate carry over of strategies. ST follow up warranted.         CHL IP TREATMENT RECOMMENDATION 06/06/2015  Treatment Recommendations Therapy as outlined in treatment plan below     CHL IP DIET RECOMMENDATION 06/06/2015  SLP Diet Recommendations Dysphagia 2 (Fine chop);Thin  Liquid Administration via (None)  Medication Administration Whole meds with liquid  Compensations Minimize environmental distractions;Slow rate;Small  sips/bites;Check for pocketing  Postural Changes and/or Swallow Maneuvers (None)     CHL IP OTHER RECOMMENDATIONS 06/06/2015  Recommended Consults (None)  Oral Care Recommendations Oral care BID  Other Recommendations (None)     No flowsheet data found.   CHL IP FREQUENCY AND DURATION 06/06/2015  Speech Therapy Frequency (ACUTE ONLY) min 2x/week  Treatment Duration 2 weeks     Pertinent Vitals/Pain        Arvil Chaco MA, CCC-SLP Acute Care Speech Language  Pathologist       Levi Aland 06/06/2015, 1:44 PM         Subjective: No overnight events safety sitter at bedside  Objective: Filed Vitals:   06/23/15 2105 06/24/15 0140 06/24/15 0500 06/24/15 0623  BP: 123/82 145/77  143/83  Pulse: 72 74  72  Temp: 97.5 F (36.4 C) 98 F (36.7 C)  97.8 F (36.6 C)  TempSrc: Axillary Axillary  Axillary  Resp: 20 18  20   Height:      Weight:   69.174 kg (152 lb 8 oz)   SpO2: 98% 97%  96%    Intake/Output Summary (Last 24 hours) at 06/24/15 0806 Last data filed at 06/24/15 0719  Gross per 24 hour  Intake      0 ml  Output   1525 ml  Net  -1525 ml   Weight change: 1.043 kg (2 lb 4.8 oz) Exam:   General:  Pt is alert, follows commands appropriately, not in acute distress  HEENT: lump on left neck  Cardiovascular: RRR, S1/S2, no rubs, no gallops  Respiratory: CTA bilaterally, no wheezing, no crackles, no rhonchi  Abdomen: Soft/+BS, non tender, non distended, no guarding  Extremities: No edema  Data Reviewed: Basic Metabolic Panel:  Recent Labs Lab 06/19/15 0506 06/20/15 0443 06/21/15 0800 06/22/15 0759 06/23/15 1006 06/24/15 0550  NA 153* 151* 147* 141 139 139  K 3.4* 4.1 4.1 3.6 3.6 3.6  CL 126* 124* 116* 109 109 109  CO2 21* 17* 24 23 23 23   GLUCOSE 135* 170* 123* 176* 129* 104*  BUN 41* 39* 31* 24* 24* 22*  CREATININE 0.96 1.01 0.92 0.76 0.85 0.73  CALCIUM 8.6* 8.3* 8.4* 8.1* 8.3* 8.0*  MG 2.5*  --  2.2  --   --   --   PHOS 3.8  --   --   --   --   --    Liver Function Tests:  Recent Labs Lab 06/19/15 0506 06/20/15 0443  AST 236* 173*  ALT 346* 290*  ALKPHOS 58 54  BILITOT 0.7 0.9  PROT 6.2* 5.7*  ALBUMIN 2.7* 2.5*   No results for input(s): LIPASE, AMYLASE in the last 168 hours. No results for input(s):  AMMONIA in the last 168 hours. CBC:  Recent Labs Lab 06/19/15 0506 06/20/15 0443 06/21/15 0800  WBC 7.0 6.4 7.6  HGB 13.1 12.4* 12.0*  HCT 40.6 39.4 37.8*  MCV 97.1 99.5 99.2  PLT  115* 95* 90*   Cardiac Enzymes:  Recent Labs Lab 06/19/15 1415 06/19/15 1939 06/20/15 1210 06/20/15 1657  TROPONINI 0.09* 0.09* 0.08* 0.07*   BNP: Invalid input(s): POCBNP CBG:  Recent Labs Lab 06/23/15 1138 06/23/15 1617 06/23/15 1959 06/24/15 0022 06/24/15 0440  GLUCAP 90 134* 87 101* 95    No results found for this or any previous visit (from the past 240 hour(s)).   Scheduled Meds: . amLODipine  10 mg Oral Daily  . ampicillin-sulbactam (UNASYN) IV  3 g Intravenous Q6H  . antiseptic oral rinse  7 mL Mouth Rinse q12n4p  . chlorhexidine  15 mL Mouth Rinse BID  . cloNIDine  0.2 mg Oral TID  . feeding supplement (PRO-STAT SUGAR FREE 64)  30 mL Per Tube Daily  . folic acid  1 mg Oral Daily  . free water  300 mL Per Tube QID  . heparin subcutaneous  5,000 Units Subcutaneous 3 times per day  . hydrALAZINE  100 mg Per Tube 3 times per day  . insulin aspart  2-6 Units Subcutaneous 6 times per day  . ipratropium-albuterol  3 mL Nebulization TID  . labetalol  300 mg Oral TID  . multivitamin  5 mL Per Tube Daily  . senna-docusate  1 tablet Oral BID  . thiamine  100 mg Oral Daily   Continuous Infusions: . feeding supplement (JEVITY 1.5 CAL/FIBER) 1,000 mL (06/23/15 1055)   25 min   Telena Peyser, DO  [facility] Pager [phone]  If 7PM-7AM, please contact night-coverage [email or web address] Password [email or web address] 06/24/2015, 8:06 AM   LOS: 19 days

## 2015-06-24 NOTE — Progress Notes (Signed)
Nutrition Follow-up  DOCUMENTATION CODES:   Severe malnutrition in context of chronic illness  INTERVENTION:   Increase Jevity 1.5 to 65 ml/hr via NGT. Tube feeding will regimen provides 2340 kcal (100% of needs), 100 grams of protein, and 1186 ml of H2O.  TF plus free water flushes (300 ml QID) provides 2385 ml of water daily.    NUTRITION DIAGNOSIS:   Malnutrition related to chronic illness as evidenced by severe depletion of body fat, severe depletion of muscle mass.  Ongoing  GOAL:   Patient will meet greater than or equal to 90% of their needs  Being met  MONITOR:   TF tolerance, Weight trends, Labs, I & O's, Skin  REASON FOR ASSESSMENT:   Consult Enteral/tube feeding initiation and management  ASSESSMENT:   60 year old male with no known PMH who presents on 7/22 with acute onset slurred speech and inability to stand. Code stroke called, hemorrhagic thalamic stroke noted. On 7/26 patient's CT and mental status worsened with cytotoxic edema and shift then was transferred to the ICU for inability to protect his airway. Patient will also need hypertonic saline.  Pt pulled NGT yesterday. NGT replaced and TF infusing at time of visit with Jevity 1.5@ 55 ml/hr; nurse tech at bedside. Pt is tolerating TF well.  Per tech, pt had nose bleed today through nostril without NGT. Pt's weight has remained fairly stable this past week around 150 lbs. Per MD note, plan for PEG 06/25/15.   Labs: low calcium, low hemoglobin  Diet Order:  Diet NPO time specified  Skin:  Reviewed, no issues  Last BM:  8/9  Height:   Ht Readings from Last 1 Encounters:  06/05/15 6' (1.829 m)    Weight:   Wt Readings from Last 1 Encounters:  06/24/15 152 lb 8 oz (69.174 kg)    Ideal Body Weight:  80.9 kg  BMI:  Body mass index is 20.68 kg/(m^2).  Estimated Nutritional Needs:   Kcal:  2100-2400  Protein:  90-105 grams  Fluid:  Per MD  EDUCATION NEEDS:   No education needs  identified at this time  [place], LDN Inpatient Clinical Dietitian Pager: [phone] After Hours Pager: [phone]

## 2015-06-25 ENCOUNTER — Encounter (HOSPITAL_COMMUNITY)
Admission: EM | Disposition: A | Discharge status: Skilled Nursing Facility | Payer: Self-pay | Source: Home / Self Care | Attending: Neurology

## 2015-06-25 ENCOUNTER — Inpatient Hospital Stay (HOSPITAL_COMMUNITY): Payer: Medicaid Other | Admitting: Anesthesiology

## 2015-06-25 ENCOUNTER — Encounter (HOSPITAL_COMMUNITY): Payer: Self-pay

## 2015-06-25 HISTORY — PX: PEG PLACEMENT: SHX5437

## 2015-06-25 LAB — GLUCOSE, CAPILLARY
GLUCOSE-CAPILLARY: 121 mg/dL — AB (ref 65–99)
GLUCOSE-CAPILLARY: 96 mg/dL (ref 65–99)
Glucose-Capillary: 121 mg/dL — ABNORMAL HIGH (ref 65–99)
Glucose-Capillary: 140 mg/dL — ABNORMAL HIGH (ref 65–99)
Glucose-Capillary: 100 mg/dL — ABNORMAL HIGH (ref 65–99)
Glucose-Capillary: 108 mg/dL — ABNORMAL HIGH (ref 65–99)

## 2015-06-25 SURGERY — INSERTION, PEG TUBE
Anesthesia: Monitor Anesthesia Care

## 2015-06-25 MED ORDER — CLONIDINE HCL 0.2 MG PO TABS: 0.2000 mg | ORAL_TABLET | Freq: Three times a day (TID) | ORAL | Status: DC

## 2015-06-25 MED ORDER — FOLIC ACID 1 MG PO TABS: 1.0000 mg | ORAL_TABLET | Freq: Every day | ORAL | Status: DC

## 2015-06-25 MED ORDER — ACETAMINOPHEN 160 MG/5ML PO SOLN: 650.0000 mg | ORAL | Status: DC | PRN

## 2015-06-25 MED ORDER — FREE WATER: 300.0000 mL | Freq: Four times a day (QID) | Status: DC

## 2015-06-25 MED ORDER — SODIUM CHLORIDE 0.9 % IV SOLN
INTRAVENOUS | Status: DC | PRN
  Administered 2015-06-25: 14:00:00 via INTRAVENOUS

## 2015-06-25 MED ORDER — CETYLPYRIDINIUM CHLORIDE 0.05 % MT LIQD: 7.0000 mL | Freq: Two times a day (BID) | OROMUCOSAL | Status: DC

## 2015-06-25 MED ORDER — THIAMINE HCL 100 MG PO TABS: 100.0000 mg | ORAL_TABLET | Freq: Every day | ORAL | Status: DC

## 2015-06-25 MED ORDER — IPRATROPIUM-ALBUTEROL 0.5-2.5 (3) MG/3ML IN SOLN: 3.0000 mL | Freq: Four times a day (QID) | RESPIRATORY_TRACT | Status: DC | PRN

## 2015-06-25 MED ORDER — INSULIN ASPART 100 UNIT/ML ~~LOC~~ SOLN: 2.0000 [IU] | SUBCUTANEOUS | Status: DC

## 2015-06-25 MED ORDER — BISACODYL 10 MG RE SUPP: 10.0000 mg | Freq: Every day | RECTAL | Status: DC | PRN

## 2015-06-25 MED ORDER — HYDRALAZINE HCL 100 MG PO TABS: 100.0000 mg | ORAL_TABLET | Freq: Three times a day (TID) | ORAL | Status: DC

## 2015-06-25 MED ORDER — CHLORHEXIDINE GLUCONATE 0.12 % MT SOLN: 15.0000 mL | Freq: Two times a day (BID) | OROMUCOSAL | Status: DC

## 2015-06-25 MED ORDER — PROPOFOL INFUSION 10 MG/ML OPTIME
INTRAVENOUS | Status: DC | PRN
  Administered 2015-06-25: 100 ug/kg/min via INTRAVENOUS

## 2015-06-25 MED ORDER — HEPARIN SODIUM (PORCINE) 5000 UNIT/ML IJ SOLN: 5000.0000 [IU] | Freq: Three times a day (TID) | INTRAMUSCULAR | Status: DC

## 2015-06-25 MED ORDER — PROPOFOL 10 MG/ML IV BOLUS
INTRAVENOUS | Status: DC | PRN
  Administered 2015-06-25: 50 mg via INTRAVENOUS

## 2015-06-25 MED ORDER — PRO-STAT SUGAR FREE PO LIQD: 30.0000 mL | Freq: Every day | ORAL | Status: DC

## 2015-06-25 MED ORDER — AMLODIPINE BESYLATE 10 MG PO TABS: 10.0000 mg | ORAL_TABLET | Freq: Every day | ORAL | Status: DC

## 2015-06-25 MED ORDER — JEVITY 1.5 CAL/FIBER PO LIQD: 1000.0000 mL | ORAL | Status: DC

## 2015-06-25 MED ORDER — ADULT MULTIVITAMIN LIQUID CH: 5.0000 mL | Freq: Every day | ORAL | Status: DC

## 2015-06-25 MED ORDER — LABETALOL HCL 300 MG PO TABS: 300.0000 mg | ORAL_TABLET | Freq: Three times a day (TID) | ORAL | Status: AC

## 2015-06-25 SURGICAL SUPPLY — 1 items: 24 French PEG ×2 IMPLANT

## 2015-06-25 NOTE — Progress Notes (Signed)
Patient ID: Mike Rowe, male   DOB: [birth year], 59 y.o.   MRN: [id number]  LOS: 20 days   Subjective: Afebrile.  More alert today and follows commands.   Objective: Vital signs in last 24 hours: Temp:  [97.8 F (36.6 C)-98.3 F (36.8 C)] 98.1 F (36.7 C) (08/11 0545) Pulse Rate:  [68-84] 84 (08/11 0545) Resp:  [16-20] 20 (08/11 0545) BP: (124-170)/(63-91) 144/63 mmHg (08/11 0545) SpO2:  [94 %-100 %] 99 % (08/11 0545) Weight:  [72.264 kg (159 lb 5 oz)] 72.264 kg (159 lb 5 oz) (08/11 0619) Last BM Date: 06/24/15  Lab Results:  CBC No results for input(s): WBC, HGB, HCT, PLT in the last 72 hours. BMET  Recent Labs  06/23/15 1006 06/24/15 0550  NA 139 139  K 3.6 3.6  CL 109 109  CO2 23 23  GLUCOSE 129* 104*  BUN 24* 22*  CREATININE 0.85 0.73  CALCIUM 8.3* 8.0*    Imaging: Dg Abd 1 View  06/24/2015   CLINICAL DATA:  A single supine abdominal film is submitted. The patient has apparently undergone placement of an enteric feeding tube.  EXAM: ABDOMEN - 1 VIEW  COMPARISON:  Abdominal film of June 18, 2015  FINDINGS: The radiodense tipped feeding tube lies in the proximal jejunum. Contrast outlines a normal to jejunal mucosal pattern.  IMPRESSION: The tip of the feeding tube appears to lie in the proximal jejunum.   Electronically Signed   By: David  Martinique M.D.   On: 06/24/2015 10:57   Dg Addison Bailey G Tube Plc W/fl-no Rad  06/24/2015   CLINICAL DATA:    NASO G TUBE PLACEMENT WITH FLUORO  Fluoroscopy was utilized by the requesting physician.  No radiographic  interpretation.      PE: General appearance: alert, cooperative and non verbal. Resp: clear to auscultation bilaterally Cardio: regular rate and rhythm, S1, S2 normal, no murmur, click, rub or gallop GI: soft, non-tender; bowel sounds normal; no masses,  no organomegaly. PANDA in place.  Extremities: extremities normal, atraumatic, no cyanosis or edema Neurologic:follows commands.  Left side flaccid.      Patient Active  Problem List   Diagnosis Date Noted  . Dysphagia   . Aspiration into airway   . Encounter for feeding tube placement   . Stroke due to intracerebral hemorrhage   . Tobacco abuse   . Cocaine abuse   . HTN (hypertension), malignant   . Elevated liver enzymes   . Nontraumatic subcortical hemorrhage of cerebral hemisphere   . Altered level of consciousness   . Atelectasis   . Acute respiratory failure with hypoxia   . Alcohol dependence with uncomplicated withdrawal   . ICH (intracerebral hemorrhage) 06/05/2015    Assessment/Plan: Right thalamic stroke Hypertension Hx substance abuse Dysphagia    TF on hold since 8AM, heparin on hold On Unasyn Obtain consent PEG tube at 1400 today, unfortunately, TF were not stopped as ordered Order for abdominal binder   Erby Pian, ANP-BC Pager: [phone] General Trauma PA Pager: [phone]   06/25/2015 8:45 AM

## 2015-06-25 NOTE — Op Note (Signed)
06/05/2015 - 06/25/2015  2:26 PM  PATIENT:  Mike Rowe  59 y.o. male  PRE-OPERATIVE DIAGNOSIS:  Dysphagia S/P CVA  POST-OPERATIVE DIAGNOSIS:  Dysphagia S/P CVA  PROCEDURE:  Procedure(s): ESOPHAGOGASTRODUODENOSCOPY PERCUTANEOUS ENDOSCOPIC GASTROSTOMY (PEG) PLACEMENT  SURGEON:  Surgeon(s): Georganna Skeans, MD  ASSISTANTS: Estill Bakes [place], ARNP   ANESTHESIA:   local and IV sedation  EBL:  Total I/O In: 400 [Other:300; IV Piggyback:100] Out: 450 [Urine:450]  BLOOD ADMINISTERED:none  DRAINS: Gastrostomy Tube   SPECIMEN:  No Specimen  DISPOSITION OF SPECIMEN:  N/A  COUNTS:  YES  DICTATION: .Dragon Dictation [name] is brought for EGD and PEG tube placement. He was identified in the preop holding area. He is on IV antibiotics. Consent form confirming medical necessity was signed. He was brought to the endoscopy suite. IV sedation was administered by the anesthesia staff. We did time out procedure. Bite-block was placed and the scope was inserted via his mouth down into his esophagus. There were no gross lesions. The stomach was entered and insufflated. We then entered the first portion of the duodenum and the inspection there revealed no ulcers or obstructions. Scope was withdrawn back into the stomach. The stomach was insufflated and we identified an easy poke site. The abdominal wall was prepped and draped in sterile fashion. Angiocath was inserted under direct vision followed by the guidewire. Guidewire was grasped in the stomach and brought out through the mouth. PEG tube was attached and brought out through the abdominal wall. Scope was reinserted and the peritoneal position was confirmed. The flange was applied to appropriate depth. Photo was taken. Stomach was decompressed. Scope was withdrawn. Antibiotic ointment and a dressing was placed at the PEG site. Abdominal binder was applied. He tolerated the procedure without apparent complication and was taken recovery in stable  condition       PATIENT DISPOSITION:  PACU - hemodynamically stable.   Delay start of Pharmacological VTE agent (>24hrs) due to surgical blood loss or risk of bleeding:  no  Georganna Skeans, MD, MPH, FACS Pager: [phone]  8/11/20162:26 PM

## 2015-06-25 NOTE — Transfer of Care (Signed)
Immediate Anesthesia Transfer of Care Note  Patient: Mike Rowe  Procedure(s) Performed: Procedure(s): PERCUTANEOUS ENDOSCOPIC GASTROSTOMY (PEG) PLACEMENT (N/A)  Patient Location: PACU  Anesthesia Type:MAC  Level of Consciousness: sedated  Airway & Oxygen Therapy: Patient Spontanous Breathing and Patient connected to nasal cannula oxygen  Post-op Assessment: Report given to RN and Post -op Vital signs reviewed and stable  Post vital signs: Reviewed and stable  Last Vitals:  Filed Vitals:   06/25/15 1430  BP: 138/78  Pulse: 65  Temp:   Resp: 13    Complications: No apparent anesthesia complications

## 2015-06-25 NOTE — Anesthesia Preprocedure Evaluation (Addendum)
Anesthesia Evaluation  Patient identified by MRN, date of birth, ID band Patient awake    Reviewed: Allergy & Precautions, H&P , NPO status , Patient's Chart, lab work & pertinent test results  Airway Mallampati: II  TM Distance: >3 FB Neck ROM: Full   Comment: Pt will not follow commands to open mouth. Dental no notable dental hx.  Unable to evaluate.:   Pulmonary neg pulmonary ROS,  Acute resp failure end of July... breath sounds clear to auscultation  Pulmonary exam normal       Cardiovascular hypertension, Rhythm:Regular Rate:Normal  Malignant HTN   Neuro/Psych CVA, Residual Symptoms negative neurological ROS     GI/Hepatic negative GI ROS, (+)     substance abuse  cocaine use,   Endo/Other  negative endocrine ROS  Renal/GU negative Renal ROS  negative genitourinary   Musculoskeletal   Abdominal   Peds  Hematology negative hematology ROS (+)   Anesthesia Other Findings Cocaine abuse in past.  Impossible to assess dentition and airway; pt uncooperative  Reproductive/Obstetrics negative OB ROS                          Anesthesia Physical Anesthesia Plan  ASA: III  Anesthesia Plan: MAC   Post-op Pain Management:    Induction: Intravenous  Airway Management Planned: Nasal Cannula  Additional Equipment:   Intra-op Plan:   Post-operative Plan:   Informed Consent: I have reviewed the patients History and Physical, chart, labs and discussed the procedure including the risks, benefits and alternatives for the proposed anesthesia with the patient or authorized representative who has indicated his/her understanding and acceptance.   Dental advisory given  Plan Discussed with: CRNA  Anesthesia Plan Comments:        Anesthesia Quick Evaluation

## 2015-06-25 NOTE — Progress Notes (Signed)
STROKE TEAM PROGRESS NOTE   HISTORY Mike Rowe is an 59 y.o. male with no reported past medical history (hasn't been to doctors in years) presents with acute onset of slurred speech and left sided weakness. LSW 2130 on 06/05/2015 when he noted he was unable to stand or use his left side. EMS called, upon arrival noted BP of 240/140. Code stroke activated. CT head imaging reviewed, shows right sided thalamic/BG ICH. Denies taking any blood thinners. He denies any past medical history including hypertension. Denies any surgical history. Denies alcohol or drug abuse. Modified Rankin: 0 ICH Score: 1. Initial NIHSS of 12   SUBJECTIVE (INTERVAL HISTORY):   The patient remains poorly responsive. PEG placed today. Skilled nursing facility placement planned.   Temp:  [97.6 F (36.4 C)-98.3 F (36.8 C)] 97.6 F (36.4 C) (08/11 1500) Pulse Rate:  [65-84] 73 (08/11 1500) Cardiac Rhythm:  [-] Normal sinus rhythm (08/11 0425) Resp:  [13-20] 15 (08/11 1500) BP: (124-163)/(63-84) 155/68 mmHg (08/11 1500) SpO2:  [95 %-100 %] 96 % (08/11 1500) Weight:  [72.264 kg (159 lb 5 oz)] 72.264 kg (159 lb 5 oz) (08/11 0619)   Recent Labs Lab 06/24/15 1953 06/25/15 0027 06/25/15 0350 06/25/15 0759 06/25/15 1205  GLUCAP 133* 121* 121* 140* 100*    Recent Labs Lab 06/19/15 0506 06/20/15 0443 06/21/15 0800 06/22/15 0759 06/23/15 1006 06/24/15 0550  NA 153* 151* 147* 141 139 139  K 3.4* 4.1 4.1 3.6 3.6 3.6  CL 126* 124* 116* 109 109 109  CO2 21* 17* 24 23 23 23   GLUCOSE 135* 170* 123* 176* 129* 104*  BUN 41* 39* 31* 24* 24* 22*  CREATININE 0.96 1.01 0.92 0.76 0.85 0.73  CALCIUM 8.6* 8.3* 8.4* 8.1* 8.3* 8.0*  MG 2.5*  --  2.2  --   --   --   PHOS 3.8  --   --   --   --   --     Recent Labs Lab 06/19/15 0506 06/20/15 0443  AST 236* 173*  ALT 346* 290*  ALKPHOS 58 54  BILITOT 0.7 0.9  PROT 6.2* 5.7*  ALBUMIN 2.7* 2.5*    Recent Labs Lab 06/19/15 0506 06/20/15 0443 06/21/15 0800  WBC  7.0 6.4 7.6  HGB 13.1 12.4* 12.0*  HCT 40.6 39.4 37.8*  MCV 97.1 99.5 99.2  PLT 115* 95* 90*    Recent Labs Lab 06/19/15 1415 06/19/15 1939 06/20/15 1210 06/20/15 1657  TROPONINI 0.09* 0.09* 0.08* 0.07*   No results for input(s): LABPROT, INR in the last 72 hours. No results for input(s): COLORURINE, LABSPEC, [place], GLUCOSEU, HGBUR, BILIRUBINUR, KETONESUR, PROTEINUR, UROBILINOGEN, NITRITE, LEUKOCYTESUR in the last 72 hours.  Invalid input(s): APPERANCEUR     Component Value Date/Time   CHOL 153 06/16/2015 0345   TRIG 176* 06/16/2015 1035   HDL 16* 06/16/2015 0345   CHOLHDL 9.6 06/16/2015 0345   VLDL 40 06/16/2015 0345   LDLCALC 97 06/16/2015 0345   Lab Results  Component Value Date   HGBA1C 5.2 06/07/2015      Component Value Date/Time   LABOPIA NONE DETECTED 06/06/2015 0100   COCAINSCRNUR POSITIVE* 06/06/2015 0100   LABBENZ NONE DETECTED 06/06/2015 0100   AMPHETMU NONE DETECTED 06/06/2015 0100   THCU NONE DETECTED 06/06/2015 0100   LABBARB NONE DETECTED 06/06/2015 0100    No results for input(s): ETH in the last 168 hours.   CT HEAD with CONSTRAST  06/05/2015    Acute right thalamic hemorrhage.    06/06/2015    (  Pre Fall ) Increase in size of right thalamic/posterior right internal capsule hematoma now with maximal transverse dimension 2.3 x 2.2 cm versus prior 2.1 x 1.6 cm.  Increase surrounding vasogenic edema and local mass effect with mild compression the right lateral ventricle without evidence of midline shift. Interval development of blood within the deep dependent aspect of the right lateral ventricle consistent with breakthrough of the right thalamic hemorrhage into the right lateral ventricle.  06/06/2015  ( Post fall ) Right for thalamic hemorrhage with a small amount of intraventricular blood appears unchanged. No new abnormality is seen.  06/15/2015 Essentially no change compared to recent prior study. Hemorrhagic lesion measuring 4.5 x 3.6  arising from the right basal ganglia with surrounding edema, stable. Midline shift of the third ventricle toward the left is stable. Slight ventricular prominence with layering hemorrhage in the occipital horns of the lateral ventricles remain without appreciable change. No change in overall ventricular size and configuration. No new infarct compared to recent prior Study.  06/19/15  Limited assessment due to patient positioning within the CT scanner. Evolving RIGHT thalamic hemorrhage with intraventricular extension, stable moderate hydrocephalus.  Carotid Ultrasound Technically limited due to left IJ line, pt position, and movement. Visualized bilaterally the bifurcation, ICA, and ICA. Appears to be 1-39% ICA stenosis.  2D echo - Left ventricle: The cavity size was normal. Wall thickness was increased in a pattern of mild LVH. Systolic function was normal.The estimated ejection fraction was in the range of 60% to 65%.Wall motion was normal; there were no regional wall motionabnormalities. Doppler parameters are consistent with abnormalleft ventricular relaxation (grade 1 diastolic dysfunction). Impressions:  No cardiac source of emboli was indentified.   PHYSICAL EXAM  Temp:  [97.6 F (36.4 C)-98.3 F (36.8 C)] 97.6 F (36.4 C) (08/11 1500) Pulse Rate:  [65-84] 73 (08/11 1500) Resp:  [13-20] 15 (08/11 1500) BP: (124-163)/(63-84) 155/68 mmHg (08/11 1500) SpO2:  [95 %-100 %] 96 % (08/11 1500) Weight:  [72.264 kg (159 lb 5 oz)] 72.264 kg (159 lb 5 oz) (08/11 0619)  General - Well nourished, well developed, intubated and not following commands.  Ophthalmologic - Fundi not visualized due to noncooperation.  Cardiovascular - Regular rate and rhythm.  Neuro -  on nasal cannula, with increased RR, mild respiratory failure.. Pt opens eyes on voice and follows some simple commands, such as wiggle toes and showing fingers. PERRL, right sided gaze preference, doll's eyes present, positive  corneal and gag and cough, breathing over the vent. On pain stimulation, left UE and LE withdraw to pain, right UE and LE spontaneous movement. LUE increased muscle tone. Babinski positive on the left. DTR decreased on the left. Sensation, coordination and gait not tested.   ASSESSMENT [name] is a 59 y.o. male with no significant past medical history no regular medical follow-up presenting with left hemiparesis really elevated blood pressures. He did not receive IV t-PA due to acute right thalamic hemorrhage.   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  2D Echo - unremarkable  LDL 97  HgbA1c 5.2  SCDs for VTE prophylaxis Diet NPO time specified.   no antithrombotic prior to admission  Ongoing aggressive stroke risk factor management  Therapy recommendations: SNF recommended  Disposition:  Pending. Medically ready for discharge once feeding  route established. Dr. Leonie Man discussed with SW. (has family but cannot find) SW has secured a bed. SW team pursuing guardianship. Await tube placement.  Patient does not have capacity to make own decisions.  Given R brain stroke, pt is impulsive, inconsistently follows 1-step commands, poor awareness of environment, decreased awareness of deficits (neglect), psychomotor delay, impaired communication and lethargy  Cerebral edema Induced Hypernatremia  Off 3% saline   Na normalized  Cocaine abuse  Positive on UDS  Malignant Hypertension  Home meds:  No antihypertensives medications prior to admission  BP 217/123 on arrival  On norvasc, clonidine, hydralazine, labetalol  Continues to improve  Hyperlipidemia  LDL 97  No home meds  Hold off statins due to elevated liver enzymes.  Tachypnea/respiratory  distress/acute respiratory failure  On 8/5 pt w/ O2 desat with tachypnea  Suspect the patient has aspiration pneumonitis given his encephalopathy and dysphagia  BNP t149.3 (H)  CXR - low lung volumes with mild bibasilar atelectasis - stable  Lactic acid 1.6 within normal range  Troponin 0.09 (H) - every 6 hours 3 - mildly elevated 3  CMP - sodium 153- potassium 3.4- chloride- 126 CO2- 21 BUN- 41 creatinine- 0.96 AST- 236 ALT- 346  CBC - WBCs 7.0  Treated with Unasyn, D # 6/7 - recommend change to augmentin at d/c (if applicable)  Stable now  Dysphagia, malnutrition  Continue NG tube feedings   Speech therapy recommends PEG  Trauma consulted for PEG - plan placement 06/25/2015 due to scheduling issues  Will need 2 MDs to agree placement necessary given lack of family for consent  Other Stroke Risk Factors  Cigarette smoking  alcohol use  Cocaine use  Other Active Problems  Thrombocytopenia  Anxiety / agitation  Mildly elevated BUN  Transaminasemia secondary to chronic hepatitis C   Other Pertinent History  No family contact yet  PLAN  Check labs tomorrow a.m.  SNF - Friday if stable.  Hospital day # [place] for Pager information 06/25/2015 3:40 PM    I have personally examined this patient, reviewed notes, independently viewed imaging studies, participated in medical decision making and plan of care. I have made any additions or clarifications directly to the above note. Agree with note above.  Plan PEG tube tomorrow and SNF transfer in next 2-3 days I have personally examined this patient, reviewed notes, independently viewed imaging studies, participated in medical decision making and plan of care. I have made any additions or clarifications directly to the above note. Agree with note above.  Antony Contras, MD Medical Director [facility] Pager: [phone] 06/25/2015 11:22  PM   06/25/2015 3:40 PM To contact Stroke Continuity provider, please refer to [email or web address] After hours, contact General Neurology

## 2015-06-25 NOTE — Progress Notes (Signed)
Tube feeding stop, patient's NPO for Peg tube replacement.    Ave Filter, RN

## 2015-06-25 NOTE — Progress Notes (Addendum)
Patient readmitted to 4N28 from Peg Placement. No signs of pain or discomfort.

## 2015-06-25 NOTE — Anesthesia Postprocedure Evaluation (Signed)
  Anesthesia Post-op Note  Patient: Mike Rowe  Procedure(s) Performed: Procedure(s): PERCUTANEOUS ENDOSCOPIC GASTROSTOMY (PEG) PLACEMENT (N/A)  Patient Location: PACU  Anesthesia Type:MAC  Level of Consciousness: awake, alert  and oriented  Airway and Oxygen Therapy: Patient Spontanous Breathing  Post-op Pain: none  Post-op Assessment: Post-op Vital signs reviewed LLE Motor Response: Spastic LLE Sensation: Decreased RLE Motor Response: Purposeful movement RLE Sensation: Full sensation      Post-op Vital Signs: Reviewed  Last Vitals:  Filed Vitals:   06/25/15 1902  BP: 140/66  Pulse: 68  Temp: 36.7 C  Resp: 20    Complications: No apparent anesthesia complications

## 2015-06-25 NOTE — Clinical Social Work Note (Signed)
Guardianship court date scheduled for September 6 at [facility] will serve documents between today and tomorrow, 8/12. Patient has a bed at [facility] and [facility] once medically stable for discharge.   Patient having PEG tube placed today, 8/11 at [facility] will continue to follow patient for continued support and to facilitate pt's discharge needs once medically stable.   FL-2 on chart for MD signature.  Glendon Axe, MSW, LCSWA [phone] 06/25/2015 11:20 AM

## 2015-06-25 NOTE — Discharge Summary (Signed)
Stroke Discharge Summary  Patient ID: Mike Rowe   MRN: [id number]      DOB: 21-Feb-1956  Date of Admission: 06/05/2015 Date of Discharge: 06/26/2015  Attending Physician:  Garvin Fila, MD, Stroke MD  Consulting Physician(s):     pulmonary/intensive care, general surgery and internal medicine  Patient's PCP:  No primary care provider on file.  DISCHARGE DIAGNOSIS:  Right thalamic hemorrhage Active Problems:   ICH (intracerebral hemorrhage)   Alcohol dependence with uncomplicated withdrawal   Acute respiratory failure with hypoxia   Altered level of consciousness   Atelectasis   Nontraumatic subcortical hemorrhage of cerebral hemisphere   Stroke due to intracerebral hemorrhage   Tobacco abuse   Cocaine abuse   HTN (hypertension), malignant   Elevated liver enzymes   Encounter for feeding tube placement   Aspiration into airway   Dysphagia Cytotoxic cerebral edema  BMI: Body mass index is 20.66 kg/(m^2).  History reviewed. No pertinent past medical history. History reviewed. No pertinent past surgical history.    Medication List    TAKE these medications        acetaminophen 160 MG/5ML solution  Commonly known as:  TYLENOL  Place 20.3 mLs (650 mg total) into feeding tube every 4 (four) hours as needed for fever (temp >99).     amLODipine 10 MG tablet  Commonly known as:  NORVASC  Take 1 tablet (10 mg total) by mouth daily.     antiseptic oral rinse 0.05 % Liqd solution  Commonly known as:  CPC / CETYLPYRIDINIUM CHLORIDE 0.05%  7 mLs by Mouth Rinse route 2 times daily at 12 noon and 4 pm.     bisacodyl 10 MG suppository  Commonly known as:  DULCOLAX  Place 1 suppository (10 mg total) rectally daily as needed for moderate constipation.     cloNIDine 0.2 MG tablet  Commonly known as:  CATAPRES  Take 1 tablet (0.2 mg total) by mouth 3 (three) times daily.     feeding supplement (JEVITY 1.5 CAL/FIBER) Liqd  Place 1,000 mLs into feeding tube continuous.      feeding supplement (PRO-STAT SUGAR FREE 64) Liqd  Place 30 mLs into feeding tube daily.     folic acid 1 MG tablet  Commonly known as:  FOLVITE  Take 1 tablet (1 mg total) by mouth daily.     free water Soln  Place 300 mLs into feeding tube 4 (four) times daily.     heparin 5000 UNIT/ML injection  Inject 1 mL (5,000 Units total) into the skin every 8 (eight) hours.     hydrALAZINE 100 MG tablet  Commonly known as:  APRESOLINE  Place 1 tablet (100 mg total) into feeding tube every 8 (eight) hours.     insulin aspart 100 UNIT/ML injection  Commonly known as:  novoLOG  Inject 2-6 Units into the skin every 4 (four) hours.     labetalol 300 MG tablet  Commonly known as:  NORMODYNE  Take 1 tablet (300 mg total) by mouth 3 (three) times daily.     multivitamin Liqd  Place 5 mLs into feeding tube daily.     thiamine 100 MG tablet  Take 1 tablet (100 mg total) by mouth daily.        LABORATORY STUDIES CBC    Component Value Date/Time   WBC 6.7 06/26/2015 0646   RBC 3.86* 06/26/2015 0646   HGB 12.1* 06/26/2015 0646   HCT 35.7* 06/26/2015 0646   PLT  70* 06/26/2015 0646   MCV 92.5 06/26/2015 0646   MCH 31.3 06/26/2015 0646   MCHC 33.9 06/26/2015 0646   RDW 13.4 06/26/2015 0646   LYMPHSABS 1.2 06/26/2015 0646   MONOABS 0.5 06/26/2015 0646   EOSABS 0.1 06/26/2015 0646   BASOSABS 0.0 06/26/2015 0646   CMP    Component Value Date/Time   NA 135 06/26/2015 0646   K 3.4* 06/26/2015 0646   CL 106 06/26/2015 0646   CO2 20* 06/26/2015 0646   GLUCOSE 105* 06/26/2015 0646   BUN 17 06/26/2015 0646   CREATININE 0.64 06/26/2015 0646   CALCIUM 7.9* 06/26/2015 0646   PROT 5.6* 06/26/2015 0646   ALBUMIN 2.4* 06/26/2015 0646   AST 143* 06/26/2015 0646   ALT 204* 06/26/2015 0646   ALKPHOS 66 06/26/2015 0646   BILITOT 0.8 06/26/2015 0646   GFRNONAA >60 06/26/2015 0646   GFRAA >60 06/26/2015 0646   COAGS Lab Results  Component Value Date   INR 1.09 06/07/2015   INR 1.11  06/05/2015   Lipid Panel    Component Value Date/Time   CHOL 153 06/16/2015 0345   TRIG 176* 06/16/2015 1035   HDL 16* 06/16/2015 0345   CHOLHDL 9.6 06/16/2015 0345   VLDL 40 06/16/2015 0345   LDLCALC 97 06/16/2015 0345   HgbA1C  Lab Results  Component Value Date   HGBA1C 5.2 06/07/2015   Cardiac Panel (last 3 results) No results for input(s): CKTOTAL, CKMB, TROPONINI, RELINDX in the last 72 hours. Urinalysis    Component Value Date/Time   COLORURINE AMBER* 06/12/2015 1733   APPEARANCEUR CLOUDY* 06/12/2015 1733   LABSPEC 1.030 06/12/2015 1733   PHURINE 5.5 06/12/2015 1733   GLUCOSEU 100* 06/12/2015 1733   HGBUR LARGE* 06/12/2015 1733   BILIRUBINUR NEGATIVE 06/12/2015 1733   KETONESUR 15* 06/12/2015 1733   PROTEINUR 100* 06/12/2015 1733   UROBILINOGEN 1.0 06/12/2015 1733   NITRITE NEGATIVE 06/12/2015 1733   LEUKOCYTESUR NEGATIVE 06/12/2015 1733   Urine Drug Screen     Component Value Date/Time   LABOPIA NONE DETECTED 06/06/2015 0100   COCAINSCRNUR POSITIVE* 06/06/2015 0100   LABBENZ NONE DETECTED 06/06/2015 0100   AMPHETMU NONE DETECTED 06/06/2015 0100   THCU NONE DETECTED 06/06/2015 0100   LABBARB NONE DETECTED 06/06/2015 0100    Alcohol Level No results found for: [place] STUDIES       CT HEAD with CONSTRAST  06/05/2015  Acute right thalamic hemorrhage.   06/06/2015 ( Pre fall ) Increase in size of right thalamic/posterior right internal capsule hematoma now with maximal transverse dimension 2.3 x 2.2 cm versus prior 2.1 x 1.6 cm.  Increase surrounding vasogenic edema and local mass effect with mild compression the right lateral ventricle without evidence of midline shift. Interval development of blood within the deep dependent aspect of the right lateral ventricle consistent with breakthrough of the right thalamic hemorrhage into the right lateral ventricle.  06/06/2015 ( Post fall ) Right for thalamic hemorrhage with a  small amount of intraventricular blood appears unchanged. No new abnormality is seen.  06/15/2015 Essentially no change compared to recent prior study. Hemorrhagic lesion measuring 4.5 x 3.6 arising from the right basal ganglia with surrounding edema, stable. Midline shift of the third ventricle toward the left is stable. Slight ventricular prominence with layering hemorrhage in the occipital horns of the lateral ventricles remain without appreciable change. No change in overall ventricular size and configuration. No new infarct compared to recent prior Study.  06/19/15  Limited assessment due to patient positioning within the CT scanner. Evolving RIGHT thalamic hemorrhage with intraventricular extension, stable moderate hydrocephalus.  Carotid Ultrasound Technically limited due to left IJ line, pt position, and movement. Visualized bilaterally the bifurcation, ICA, and ICA. Appears to be 1-39% ICA stenosis.  2D echo - Left ventricle: The cavity size was normal. Wall thickness was increased in a pattern of mild LVH. Systolic function was normal.The estimated ejection fraction was in the range of 60% to 65%.Wall motion was normal; there were no regional wall motionabnormalities. Doppler parameters are consistent with abnormalleft ventricular relaxation (grade 1 diastolic dysfunction). Impressions: No cardiac source of emboli was indentified.         HISTORY OF PRESENT ILLNESS Mike Rowe is a 59 y.o. male with no reported past medical history (hasn't been to doctors in years) presented with acute onset of slurred speech and left sided weakness. LSW 2130 on the day of admission when he noted he was unable to stand or use his left side. EMS was called, upon arrival noted BP of 240/140. Code stroke activated. CT head imaging reviewed, showed right sided thalamic/BG ICH. Patient denied taking any blood thinners.   He deniedf any past medical history including hypertension. Denied any surgical  history. Denied alcohol or drug abuse.   Date last known well: 06/05/2015 Time last known well: 2130 tPA Given: no, ICH Modified Rankin: 0 ICH Score: 0  Initial NIHSS of 12  HOSPITAL COURSE Mr. Morgon Pamer is a 59 y.o. male with no significant past medical history no regular medical follow-up presenting with left hemiparesis and significantly elevated blood pressures. He did not receive IV t-PA due to an acute right thalamic hemorrhage.   Stroke: Right thalamic hemorrhage with IVH and cerebral edema secondary to uncontrolled hypertension   Resultant left hemiparesis, neglect  MRI not performed  MRA not performed  Carotid Doppler technically difficult, but seems unremarkable.  2D Echo - unremarkable  LDL 97  HgbA1c 5.2  SCDs and [place] Heparin for VTE prophylaxis  Diet NPO except tube feedings.  No antithrombotic prior to admission  Ongoing aggressive stroke risk factor management  Therapy recommendations: SNF recommended  Disposition: SNF - 06/27/2015. Medically ready for discharge. . Dr. Leonie Man discussed with SW. (has family but cannot find) SW has secured a bed. SW team pursuing guardianship.   Patient does not have capacity to make his own decisions.  Given R brain stroke, pt is impulsive, inconsistently follows 1-step commands, poor awareness of environment, decreased awareness of deficits (neglect), psychomotor delay, impaired communication and lethargy  Cerebral edema Induced Hypernatremia  Off 3% saline   Na normalized  Cocaine abuse  Positive on UDS  Malignant Hypertension  Home meds: No antihypertensives medications prior to admission  BP 217/123 on arrival  On norvasc, clonidine, hydralazine, labetalol  Continues to improve  Hyperlipidemia  LDL 97  No home meds  Hold off statins due to elevated liver enzymes.  Tachypnea/respiratory distress/acute respiratory failure  On 8/5 pt w/ O2 desat with tachypnea  Suspect the patient has  aspiration pneumonitis given his encephalopathy and dysphagia  BNP t149.3 (H)  CXR - low lung volumes with mild bibasilar atelectasis - stable  Lactic acid 1.6 within normal range  Treated with Unasyn for 7 days.  (06/26/2015 was day #7)  WBC's 6.7. Afebrile.  Stable now  Dysphagia, malnutrition  Speech therapy recommended PEG  Trauma surgeons consulted. PEG placed 06/25/2015.  Abdominal binder at all times to help prevent accidental removal  2 MDs agreed to placement being necessary due to lack of family for consent.  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 rate 20, height 6' (1.829 m), weight 69.128 kg (152 lb 6.4 oz), SpO2 95 %.   General - Well nourished, well developed poorly responsive.  Ophthalmologic - Fundi not visualized due to noncooperation.  Cardiovascular - Regular rate and rhythm.  Neuro -Pt opens eyes on voice and follows some simple commands, such as wiggle toes and showing fingers. PERRL, right sided gaze preference, doll's eyes present, positive corneal and gag and cough. On pain stimulation, left UE and LE withdraw to pain, right UE and LE spontaneous movement. LUE increased muscle tone. Babinski positive on the left. DTR decreased on the left. Sensation, coordination and gait not tested.  Discharge Diet - NPO (Feeding supplement, medications, and free water per PEG).  Jevity 1.5 (390 mls every 6 hours) via PEG. Tube feeding regimen provides 2340 kcal (100% of needs), 100 grams of protein, and 1186 ml of H2O. TF plus free water flushes (300 ml QID) provides 2385 ml of water daily.   Pro Stat Sugar Free 64 - 30 mls into PEG  daily.   DISCHARGE PLAN  Disposition:  Skilled nursing facility placement  no antithrombotic for secondary stroke prevention due to hemorrhagic stroke.    45 minutes minutes were spent preparing discharge.  [name] PA-C [facility] Pager [phone] 06/26/2015, 12:33 PM I have personally examined this patient, reviewed notes, independently viewed imaging studies, participated in medical decision making and plan of care. I have made any additions or clarifications directly to the above note. Agree with note above.    Antony Contras, MD Medical Director [facility] Pager: [phone] 06/26/2015 1:34 PM

## 2015-06-26 ENCOUNTER — Encounter (HOSPITAL_COMMUNITY): Payer: Self-pay | Admitting: General Surgery

## 2015-06-26 LAB — GLUCOSE, CAPILLARY
GLUCOSE-CAPILLARY: 92 mg/dL (ref 65–99)
GLUCOSE-CAPILLARY: 121 mg/dL — AB (ref 65–99)
Glucose-Capillary: 101 mg/dL — ABNORMAL HIGH (ref 65–99)
Glucose-Capillary: 106 mg/dL — ABNORMAL HIGH (ref 65–99)
Glucose-Capillary: 117 mg/dL — ABNORMAL HIGH (ref 65–99)

## 2015-06-26 LAB — COMPREHENSIVE METABOLIC PANEL
ALT: 204 U/L — AB (ref 17–63)
AST: 143 U/L — AB (ref 15–41)
Albumin: 2.4 g/dL — ABNORMAL LOW (ref 3.5–5.0)
Alkaline Phosphatase: 66 U/L (ref 38–126)
Anion gap: 9 (ref 5–15)
BILIRUBIN TOTAL: 0.8 mg/dL (ref 0.3–1.2)
BUN: 17 mg/dL (ref 6–20)
CO2: 20 mmol/L — AB (ref 22–32)
CREATININE: 0.64 mg/dL (ref 0.61–1.24)
Calcium: 7.9 mg/dL — ABNORMAL LOW (ref 8.9–10.3)
Chloride: 106 mmol/L (ref 101–111)
GFR calc Af Amer: >60 mL/min (ref >60)
GFR calc non Af Amer: >60 mL/min (ref >60)
GLUCOSE: 105 mg/dL — AB (ref 65–99)
Potassium: 3.4 mmol/L — ABNORMAL LOW (ref 3.5–5.1)
Sodium: 135 mmol/L (ref 135–145)
Total Protein: 5.6 g/dL — ABNORMAL LOW (ref 6.5–8.1)

## 2015-06-26 LAB — CBC WITH DIFFERENTIAL/PLATELET
Basophils Absolute: 0 10*3/uL (ref 0.0–0.1)
Basophils Relative: 0 % (ref 0–1)
Eosinophils Absolute: 0.1 10*3/uL (ref 0.0–0.7)
Eosinophils Relative: 2 % (ref 0–5)
HCT: 35.7 % — ABNORMAL LOW (ref 39.0–52.0)
Hemoglobin: 12.1 g/dL — ABNORMAL LOW (ref 13.0–17.0)
Lymphocytes Relative: 18 % (ref 12–46)
Lymphs Abs: 1.2 10*3/uL (ref 0.7–4.0)
MCH: 31.3 pg (ref 26.0–34.0)
MCHC: 33.9 g/dL (ref 30.0–36.0)
MCV: 92.5 fL (ref 78.0–100.0)
MONOS PCT: 8 % (ref 3–12)
Monocytes Absolute: 0.5 10*3/uL (ref 0.1–1.0)
NEUTROS ABS: 4.9 10*3/uL (ref 1.7–7.7)
Neutrophils Relative %: 72 % (ref 43–77)
Platelets: 70 10*3/uL — ABNORMAL LOW (ref 150–400)
RBC: 3.86 MIL/uL — ABNORMAL LOW (ref 4.22–5.81)
RDW: 13.4 % (ref 11.5–15.5)
WBC: 6.7 10*3/uL (ref 4.0–10.5)

## 2015-06-26 MED ORDER — CLONIDINE HCL 0.1 MG PO TABS
0.3000 mg | ORAL_TABLET | Freq: Three times a day (TID) | ORAL | Status: DC
  Administered 2015-06-26 – 2015-06-27 (×3): 0.3 mg via ORAL
  Filled 2015-06-26 (×3): qty 3

## 2015-06-26 MED ORDER — JEVITY 1.5 CAL/FIBER PO LIQD
1000.0000 mL | ORAL | Status: DC
  Administered 2015-06-26: 1000 mL
  Filled 2015-06-26 (×4): qty 1000

## 2015-06-26 MED ORDER — POTASSIUM CHLORIDE 20 MEQ PO PACK
20.0000 meq | PACK | Freq: Two times a day (BID) | ORAL | Status: AC
  Administered 2015-06-26 – 2015-06-27 (×2): 20 meq via ORAL
  Filled 2015-06-26 (×3): qty 1

## 2015-06-26 MED ORDER — NICOTINE 21 MG/24HR TD PT24
21.0000 mg | MEDICATED_PATCH | Freq: Every day | TRANSDERMAL | Status: DC
  Administered 2015-06-27: 21 mg via TRANSDERMAL
  Filled 2015-06-26: qty 1

## 2015-06-26 NOTE — Clinical Social Work Note (Signed)
Clinical Social Worker has sent discharge summary to [facility]. PEG tube placed. MD anticipates patient will discharge tomorrow, 8/12 during afternoon hours.   CSW will continue to follow patient for continued support and to facilitate discharge needs.   Glendon Axe, MSW, LCSWA [phone] 06/26/2015 2:54 PM

## 2015-06-26 NOTE — Progress Notes (Signed)
Patient ID: Mike Rowe, male   DOB: 1956-02-28, 59 y.o.   MRN: [id number]  LOS: 21 days   Subjective: Pt awake.  Moving right arm.  abd binder in place.   Objective: Vital signs in last 24 hours: Temp:  [97.6 F (36.4 C)-98.7 F (37.1 C)] 98 F (36.7 C) (08/12 0603) Pulse Rate:  [65-82] 76 (08/12 0603) Resp:  [13-20] 20 (08/12 0603) BP: (130-163)/(66-87) 142/87 mmHg (08/12 0603) SpO2:  [95 %-100 %] 95 % (08/12 0603) Weight:  [69.128 kg (152 lb 6.4 oz)] 69.128 kg (152 lb 6.4 oz) (08/12 0500) Last BM Date: 06/27/15  Lab Results:  CBC  Recent Labs  06/26/15 0646  WBC 6.7  HGB 12.1*  HCT 35.7*  PLT 70*   BMET  Recent Labs  06/24/15 0550 06/26/15 0646  NA 139 135  K 3.6 3.4*  CL 109 106  CO2 23 20*  GLUCOSE 104* 105*  BUN 22* 17  CREATININE 0.73 0.64  CALCIUM 8.0* 7.9*    Imaging: Dg Abd 1 View  06/24/2015   CLINICAL DATA:  A single supine abdominal film is submitted. The patient has apparently undergone placement of an enteric feeding tube.  EXAM: ABDOMEN - 1 VIEW  COMPARISON:  Abdominal film of June 18, 2015  FINDINGS: The radiodense tipped feeding tube lies in the proximal jejunum. Contrast outlines a normal to jejunal mucosal pattern.  IMPRESSION: The tip of the feeding tube appears to lie in the proximal jejunum.   Electronically Signed   By: David  Martinique M.D.   On: 06/24/2015 10:57   Dg Addison Bailey G Tube Plc W/fl-no Rad  06/24/2015   CLINICAL DATA:    NASO G TUBE PLACEMENT WITH FLUORO  Fluoroscopy was utilized by the requesting physician.  No radiographic  interpretation.      PE: GI: +bs, abdomen is soft and non tender.  LUQ feeding tube in place, no erythema or bleeding around the site.     Patient Active Problem List   Diagnosis Date Noted  . Dysphagia   . Aspiration into airway   . Encounter for feeding tube placement   . Stroke due to intracerebral hemorrhage   . Tobacco abuse   . Cocaine abuse   . HTN (hypertension), malignant   . Elevated liver  enzymes   . Nontraumatic subcortical hemorrhage of cerebral hemisphere   . Altered level of consciousness   . Atelectasis   . Acute respiratory failure with hypoxia   . Alcohol dependence with uncomplicated withdrawal   . ICH (intracerebral hemorrhage) 06/05/2015     Assessment/Plan: POD#1 PEG tube placement for dysphagia, CVA -may start TF -abdominal binder at all times to help prevent accidental removal -please call trauma services for further assistance.  Signing off.   Erby Pian, ANP-BC Pager: [phone] General Trauma PA Pager: [phone]   06/26/2015 8:09 AM

## 2015-06-26 NOTE — Progress Notes (Signed)
Paged MD about tele strip Vtach however patient was alert showing no signs of distress. Strip was very [place] and charge aware. Reviewed and printed strip and re adjusted leads. Will continue to monitor. Joaquin Bend E, [place] 06/26/2015 3:35 AM

## 2015-06-26 NOTE — Progress Notes (Signed)
Occupational Therapy Treatment Patient Details Name: Mike Rowe MRN: [id number] DOB: 1956/03/10 Today's Date: 06/26/2015    History of present illness Mike Rowe is an 59 y.o. male with no reported past medical history (hasn't been to doctors in years) presents with acute onset of slurred speech and left sided weakness. LSW 2130 when he noted he was unable to stand or use his left side. EMS called, upon arrival noted BP of 240/140. Code stroke activated. CT head imaging reviewed, shows right sided thalamic/BG ICH. 7/26 Transfer to the ICU for intubation and 3% saline. Extubated 8/2. s/p PERCUTANEOUS ENDOSCOPIC GASTROSTOMY (PEG) PLACEMENT 8/11.   OT comments  Pt with significant deficits. 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 No active movement observed LUE. Continue to  recommend SNF. Will follow. Goals downgraded.   Follow Up Recommendations  SNF    Equipment Recommendations  None recommended by OT    Recommendations for Other Services      Precautions / Restrictions Precautions Precautions: Fall Precaution Comments: L hemi, strong L lateral lean, R mit, Restrictions Weight Bearing Restrictions: No       Mobility Bed Mobility Overal bed mobility: +2 for physical assistance Bed Mobility: Rolling;Sidelying to Sit;Sit to Sidelying Rolling: Total assist;+2 for physical assistance Sidelying to sit: Total assist;+2 for physical assistance Supine to sit: Total assist;+2 for physical assistance (Simultaneous filing. User may not have seen previous data.) Sit to supine: Total assist;+2 for physical assistance;HOB elevated Sit to sidelying: Total assist;+2 for physical assistance General bed mobility comments: Hand over hand assist to reach for rail while rolling but did not show ability to follow command. Able to grip rail briefly but not pushing self to seated position despite cues and assist.  Transfers Overall transfer  level: Needs assistance Equipment used:  (2 person dependent lift) Transfers: Sit to/from Stand Sit to Stand: +2 physical assistance;Total assist Stand pivot transfers: Total assist;+2 physical assistance;+2 safety/equipment       General transfer comment: no initiation or assist with tranfer. Appears to demonstrate tone in LLE with dominant adduction and internal rotation. Both therapists providing BIL knee block    Balance Overall balance assessment: Needs assistance Sitting-balance support: Feet supported;Single extremity supported Sitting balance-Leahy Scale: Zero Sitting balance - Comments: pusher syndrome; L lat lean. Focused on correction of balance with facilitory cues to find midline, heavy left neglect despite cues. Worked with patient on reaching towards right to stabilize self with RUE but does not sustain. Postural control: Left lateral lean   Standing balance-Leahy Scale: Zero                     ADL                                         General ADL Comments: Total A for all ADL  Pt given washcloth to wash face which he threw onto the bed.      Vision                 Additional Comments: R gaze preference   Perception     Praxis      Cognition   Behavior During Therapy: Flat affect Overall Cognitive Status: Difficult to assess Area of Impairment: Attention;Following commands;Safety/judgement;Awareness;Problem solving   Current Attention Level: Focused        Awareness: Intellectual  Not following commands appears agitated at times -  Said "what" when name called. Only verbalization during session        Extremity/Trunk Assessment     no movement LUE. Increased proximal tone. Poor positioning of humeral head          Exercises Other Exercises Other Exercises: A Other Exercises: general RUE AAROM Other Exercises: PROM LUE. shoudler to 90 FF. Increaed tone proximally    Shoulder Instructions        General Comments      Pertinent Vitals/ Pain       Pain Assessment:  (CPOT score of 1/8) Faces Pain Scale: No hurt Pain Intervention(s): Limited activity within patient's tolerance;Monitored during session;Repositioned  Home Living                                          Prior Functioning/Environment              Frequency Min 2X/week     Progress Toward Goals  OT Goals(current goals can now be found in the care plan section)  Progress towards OT goals: Goals drowngraded-see care plan  Acute Rehab OT Goals Patient Stated Goal: unable OT Goal Formulation: Patient unable to participate in goal setting Time For Goal Achievement: 07/10/15 Potential to Achieve Goals: Fair ADL Goals Additional ADL Goal #2: Pt will maintain upright midlien postural control x 5 min EOB with mod A Additional ADL Goal #3: Pt will track to midline with mod cuing during functional task  Plan Discharge plan remains appropriate    Co-evaluation    PT/OT/SLP Co-Evaluation/Treatment: Yes Reason for Co-Treatment: Complexity of the patient's impairments (multi-system involvement) PT goals addressed during session: Mobility/safety with mobility;Balance;Strengthening/ROM OT goals addressed during session: ADL's and self-care;Strengthening/ROM      End of Session Equipment Utilized During Treatment: Gait belt   Activity Tolerance Patient tolerated treatment well   Patient Left in bed;with call bell/phone within reach;with bed alarm set;with restraints reapplied   Nurse Communication Mobility status        Time: 7681-1572 OT Time Calculation (min): 27 min  Charges: OT General Charges $OT Visit: 1 Procedure OT Treatments $Self Care/Home Management : 8-22 mins  Reagann Dolce,H[place] 06/26/2015, 4:24 PM   [facility], OTR/L  [phone] 06/26/2015

## 2015-06-26 NOTE — Progress Notes (Signed)
Physical Therapy Treatment Patient Details Name: Mike Rowe MRN: [id number] DOB: Jan 07, 1956 Today's Date: 06/26/2015    History of Present Illness Mike Rowe is an 59 y.o. male with no reported past medical history (hasn't been to doctors in years) presents with acute onset of slurred speech and left sided weakness. LSW 2130 when he noted he was unable to stand or use his left side. EMS called, upon arrival noted BP of 240/140. Code stroke activated. CT head imaging reviewed, shows right sided thalamic/BG ICH. 7/26 Transfer to the ICU for intubation and 3% saline. Extubated 8/2. s/p PERCUTANEOUS ENDOSCOPIC GASTROSTOMY (PEG) PLACEMENT 8/11.    PT Comments    Slow progression with physical therapy. Focused on bed mobility and seated balance tasks. Pt with difficulty finding midline, pushing moderately with RUE, causing left lateral lean. Unable to follow commands for LE exercises. Will continue to follow and progress as tolerated.  Follow Up Recommendations  SNF;Supervision/Assistance - 24 hour     Equipment Recommendations  None recommended by PT    Recommendations for Other Services       Precautions / Restrictions Precautions Precautions: Fall Precaution Comments: L hemi, strong L lateral lean, R mit, Restrictions Weight Bearing Restrictions: No    Mobility  Bed Mobility Overal bed mobility: +2 for physical assistance Bed Mobility: Rolling;Sidelying to Sit;Sit to Sidelying Rolling: Total assist;+2 for physical assistance Sidelying to sit: Total assist;+2 for physical assistance Supine to sit: Total assist;+2 for physical assistance (Simultaneous filing. User may not have seen previous data.) Sit to supine: Total assist;+2 for physical assistance;HOB elevated Sit to sidelying: Total assist;+2 for physical assistance General bed mobility comments: Hand over hand assist to reach for rail while rolling but did not show ability to follow command. Able to grip rail briefly but not  pushing self to seated position despite cues and assist.  Transfers Overall transfer level: Needs assistance Equipment used:  (2 person dependent lift) Transfers: Sit to/from Stand Sit to Stand: +2 physical assistance;Total assist Stand pivot transfers: Total assist;+2 physical assistance;+2 safety/equipment       General transfer comment: no initiation or assist with tranfer. Appears to demonstrate tone in LLE with dominant adduction and internal rotation. Both therapists providing BIL knee block  Ambulation/Gait                 Stairs            Wheelchair Mobility    Modified Rankin (Stroke Patients Only) Modified Rankin (Stroke Patients Only) Pre-Morbid Rankin Score: No symptoms Modified Rankin: Severe disability     Balance Overall balance assessment: Needs assistance Sitting-balance support: Feet supported;Single extremity supported Sitting balance-Leahy Scale: Zero Sitting balance - Comments: pusher syndrome; L lat lean. Focused on correction of balance with facilitory cues to find midline, heavy left neglect despite cues. Worked with patient on reaching towards right to stabilize self with RUE but does not sustain. Postural control: Left lateral lean   Standing balance-Leahy Scale: Zero                      Cognition Arousal/Alertness: Lethargic Behavior During Therapy: Flat affect Overall Cognitive Status: Difficult to assess Area of Impairment: Attention;Following commands;Safety/judgement;Awareness;Problem solving   Current Attention Level: Focused       Awareness: Intellectual        Exercises Other Exercises Other Exercises: Attempted pt to perform hip and knee flexion/extension however no voluntary movement performed on command. Performed passively for ROM. Some resistance in RLE, Tone  noted at end range hip flexion in LLE Other Exercises: general RUE AAROM Other Exercises: PROM LUE. shoudler to 90 FF. Increaed tone  proximally    General Comments General comments (skin integrity, edema, etc.): LUE appears subluxed at glenohumeral joint. Very limited rotation, likely resulting from increased tone.around shoulder girdle      Pertinent Vitals/Pain Pain Assessment:  (CPOT score of 1/8) Faces Pain Scale: No hurt Pain Intervention(s): Limited activity within patient's tolerance;Monitored during session;Repositioned    Home Living                      Prior Function            PT Goals (current goals can now be found in the care plan section) Acute Rehab PT Goals Patient Stated Goal: unable PT Goal Formulation: Patient unable to participate in goal setting Time For Goal Achievement: 06/30/15 Potential to Achieve Goals: Fair Progress towards PT goals: Progressing toward goals    Frequency  Min 3X/week    PT Plan Current plan remains appropriate    Co-evaluation PT/OT/SLP Co-Evaluation/Treatment: Yes Reason for Co-Treatment: Complexity of the patient's impairments (multi-system involvement) PT goals addressed during session: Mobility/safety with mobility;Balance;Strengthening/ROM OT goals addressed during session: ADL's and self-care;Strengthening/ROM     End of Session Equipment Utilized During Treatment: Gait belt Activity Tolerance: Patient tolerated treatment well Patient left: with call bell/phone within reach;in bed;with bed alarm set     Time: 3500-9381 (Time split between PT/OT) PT Time Calculation (min) (ACUTE ONLY): 27 min  Charges:  $Therapeutic Activity: 8-22 mins                    G Codes:      Ellouise Newer 29-Jun-2015, 4:04 PM Camille Bal [place], [place]

## 2015-06-26 NOTE — Progress Notes (Signed)
Nutrition Follow-up  DOCUMENTATION CODES:   Severe malnutrition in context of chronic illness  INTERVENTION:  Initiate Jevity 1.5 @ 20 ml/hr via PEG and increase by 10 ml every 4 hours to goal rate of 65 ml/hr.   Tube feeding regimen provides 2340 kcal (100% of needs), 100 grams of protein, and 1186 ml of H2O.  TF plus free water flushes (300 ml QID) provides 2386 ml of water daily.   NUTRITION DIAGNOSIS:   Malnutrition related to chronic illness as evidenced by severe depletion of body fat, severe depletion of muscle mass.  Ongoing  GOAL:   Patient will meet greater than or equal to 90% of their needs  Unmet at this time  MONITOR:   TF tolerance, Weight trends, Labs, I & O's, Skin  REASON FOR ASSESSMENT:   Consult Enteral/tube feeding initiation and management  ASSESSMENT:   59 year old male with no known PMH who presents on 7/22 with acute onset slurred speech and inability to stand. Code stroke called, hemorrhagic thalamic stroke noted. On 7/26 patient's CT and mental status worsened with cytotoxic edema and shift then was transferred to the ICU for inability to protect his airway. Patient will also need hypertonic saline.  RD re-consulted for TF management as pt had PEG placed yesterday and it is now ready for use. Pt continues to appear severely malnourished. Weight remains stable. TF rate was increased on 8/10 to promote weight gain/muslce repletion. Will increase TF slowly today to goal rate of 65 ml/hr.   Labs: low potassium, low calcium, elevated AST/ALT, low hemoglobin  Diet Order:  Diet NPO time specified  Skin:  Reviewed, no issues  Last BM:  8/11  Height:   Ht Readings from Last 1 Encounters:  06/05/15 6' (1.829 m)    Weight:   Wt Readings from Last 1 Encounters:  06/26/15 152 lb 6.4 oz (69.128 kg)    Ideal Body Weight:  80.9 kg  BMI:  Body mass index is 20.66 kg/(m^2).  Estimated Nutritional Needs:   Kcal:  2100-2400  Protein:   90-105 grams  Fluid:  2.1-2.4 L/day  EDUCATION NEEDS:   No education needs identified at this time  [place], LDN Inpatient Clinical Dietitian Pager: [phone] After Hours Pager: [phone]

## 2015-06-26 NOTE — Progress Notes (Signed)
PROGRESS NOTE  Mike Rowe OIN:[id number] DOB: 1956/10/05 DOA: 06/05/2015 PCP: No primary care provider on file.  Brief history 59 y.o. male with no reported past medical history (hasn't been to doctors in years) presents with acute onset of slurred speech and left sided weakness. LSW 2130 when he noted he was unable to stand or use his left side. EMS called, upon arrival noted BP of 240/140. Code stroke activated. CT head imaging reviewed, shows right sided thalamic/BG ICH.   Assessment/Plan: Tachypnea/respiratory distress/acute respiratory failure Can d/c unasyn.   Right thalamic hemorrhage S/p PEG.  Ok to start TF  Hypernatremia resolved  Hypertension: blood pressure running high will increase clonidine  Transaminasemia -Likely secondary to chronic hepatitis C--positive qualitative RNA. improving  Hyperlipidemia -Hold statin at this time in the setting of elevated LFTs  Elevated troponin -Secondary to demand ischemia -Doubt ACS -EKG with ST depression insistent with LVH with repolarization abnormalities   Procedures/Studies: Dg Abd 1 View  06/24/2015   CLINICAL DATA:  A single supine abdominal film is submitted. The patient has apparently undergone placement of an enteric feeding tube.  EXAM: ABDOMEN - 1 VIEW  COMPARISON:  Abdominal film of June 18, 2015  FINDINGS: The radiodense tipped feeding tube lies in the proximal jejunum. Contrast outlines a normal to jejunal mucosal pattern.  IMPRESSION: The tip of the feeding tube appears to lie in the proximal jejunum.   Electronically Signed   By: David  Martinique M.D.   On: 06/24/2015 10:57   Ct Head Wo Contrast  06/18/2015   CLINICAL DATA:  Follow-up intracranial hemorrhage. History of hypertension, cocaine abuse, alcohol dependence.  EXAM: CT HEAD WITHOUT CONTRAST  TECHNIQUE: Contiguous axial images were obtained from the base of the skull through the vertex without intravenous contrast.  COMPARISON:  CT head June 15, 2015  and CT head June 05, 2015  FINDINGS: Patient is obliqued in the scanner, limiting evaluation.  4.6 x 3.3 cm evolving RIGHT thalamus intraparenchymal hematoma with surrounding low-density vasogenic edema. Intraventricular extension with small amount of dependent blood products in the occipital horns. Partially effaced RIGHT temporal horn, stable moderate hydrocephalus with mild interstitial edema. Local mass effect, limited assessment for midline shift due to patient positioning. No acute large vascular territory infarct.  No abnormal extra-axial fluid collections. Stable density along the falx most consistent with calcification. Basal cisterns appear patent though, decreased by positioning. Moderate calcific atherosclerosis the carotid siphons. No skull fracture. Nasogastric tube in place.  IMPRESSION: Limited assessment due to patient positioning within the CT scanner.  Evolving RIGHT thalamic hemorrhage with intraventricular extension, stable moderate hydrocephalus.   Electronically Signed   By: Elon Alas M.D.   On: 06/18/2015 23:54   Ct Head Wo Contrast  06/15/2015   CLINICAL DATA:  Intracranial hemorrhage  EXAM: CT HEAD WITHOUT CONTRAST  TECHNIQUE: Contiguous axial images were obtained from the base of the skull through the vertex without intravenous contrast.  COMPARISON:  June 12, 2015  FINDINGS: The hemorrhage arising from the right basal ganglia region measures 4.5 x 3.6 cm, essentially unchanged. There is surrounding vasogenic type edema. This hemorrhagic lesion impresses upon the third ventricle, deviating the third ventricle 8 mm from the midline toward the left. This degree of midline shift of the third ventricle is not appreciably changed. There is hemorrhage extending into the occipital horn of each lateral ventricle with layering hemorrhage in the occipital horns of the lateral ventricles, not significantly changed.  The lateral ventricles remain borderline prominent but stable. Fourth  ventricle is in the midline.  There is no new gray-white compartment lesion compared to recent prior study.  Bony calvarium appears intact.  The mastoid air cells are clear.  There is moderate edema inferior to the hemorrhage in the medial right temporal lobe, stable.  IMPRESSION: Essentially no change compared to recent prior study. Hemorrhagic lesion measuring 4.5 x 3.6 arising from the right basal ganglia with surrounding edema, stable. Midline shift of the third ventricle toward the left is stable. Slight ventricular prominence with layering hemorrhage in the occipital horns of the lateral ventricles remain without appreciable change. No change in overall ventricular size and configuration. No new infarct compared to recent prior study.   Electronically Signed   By: Lowella Grip III M.D.   On: 06/15/2015 07:08   Ct Head Wo Contrast  06/12/2015   CLINICAL DATA:  Followup hemorrhagic stroke.  EXAM: CT HEAD WITHOUT CONTRAST  TECHNIQUE: Contiguous axial images were obtained from the base of the skull through the vertex without intravenous contrast.  COMPARISON:  CT head June 09, 2015  FINDINGS: RIGHT thalamic/basal ganglia 4.5 x 3.5 cm dense intraparenchymal hematoma is unchanged in size, surrounding low-density vasogenic edema. Again noted is intraventricular extension with layering blood products in the occipital horn. Local mass effect, partially effaced lateral ventricle without obstructive hydrocephalus.  No acute large vascular territory infarct. Basal cisterns are patent. Mild calcific atherosclerosis of the carotid siphons.  Ocular globes and orbital contents are nonsuspicious, mild enophthalmos suspected. Visualized paranasal sinuses and mastoid air cells are well aerated.  IMPRESSION: Evolving RIGHT basal ganglia/ thalamic intraparenchymal hematoma with similar intraventricular extension and local mass effect. No hydrocephalus.   Electronically Signed   By: Elon Alas M.D.   On: 06/12/2015  03:00   Ct Head Wo Contrast  06/09/2015   CLINICAL DATA:  Right facial droop. Right intraparenchymal hemorrhage.  EXAM: CT HEAD WITHOUT CONTRAST  TECHNIQUE: Contiguous axial images were obtained from the base of the skull through the vertex without intravenous contrast.  COMPARISON:  CT scan of June 08, 2015.  FINDINGS: Bony calvarium appears intact. The intraparenchymal hemorrhage seen in the right thalamus and basal ganglia on prior exam is significantly enlarged currently, measuring 4.6 x 3.4 cm. There is an increased amount of hemorrhage seen within the right lateral ventricle. Small amount of hemorrhage is also seen in the left posterior horn which is new since prior exam. There appears to be hemorrhage in the third ventricle as well. Slightly increased left lateral ventricular dilatation is noted. There is approximately 6 mm of left-to-right midline shift which is increased compared to prior exam. Fourth ventricle appears normal.  IMPRESSION: Intraparenchymal hemorrhage seen in right thalamus and basal ganglia on prior exam is significantly enlarged, with increased amount of hemorrhage seen within the right lateral ventricle. Small amount of hemorrhage is now noted in the left lateral and third ventricles, with increased dilatation of the left lateral ventricle. These results will be called to the ordering clinician or representative by the Radiologist Assistant, and communication documented in the PACS or zVision Dashboard.   Electronically Signed   By: Marijo Conception, M.D.   On: 06/09/2015 10:08   Ct Head Wo Contrast  06/08/2015   CLINICAL DATA:  Intracranial hemorrhage.  Sudden behavioral change.  EXAM: CT HEAD WITHOUT CONTRAST  TECHNIQUE: Contiguous axial images were obtained from the base of the skull through the vertex without intravenous contrast.  COMPARISON:  06/07/2015 and 06/05/2015  FINDINGS: The right thalamic hemorrhage has increased in size. It currently measures 3.2 x 2.3 cm and  previously measured 2.3 x 2.2 cm. There is mildly worsened mass effect on the third ventricle. There is slight increase in volume of intraventricular blood within the right occipital horn.  No other significant interval changes are evident. No extra-axial blood is evident.  IMPRESSION: Increased size of the right thalamic hemorrhage with worsened mass effect on the third ventricle. These results were called by telephone at the time of interpretation on 06/08/2015 at 2:41 am to the patient's nurse, who verbally acknowledged these results.   Electronically Signed   By: Andreas Newport M.D.   On: 06/08/2015 02:41   Ct Head Wo Contrast  06/07/2015   CLINICAL DATA:  58 year old with intracranial hemorrhage. Follow-up. Subsequent encounter.  EXAM: CT HEAD WITHOUT CONTRAST  TECHNIQUE: Contiguous axial images were obtained from the base of the skull through the vertex without intravenous contrast.  COMPARISON:  06/04/2013.  FINDINGS: Increase in size of right thalamic/posterior right internal capsule hematoma now with maximal transverse dimension 2.3 x 2.2 cm versus prior 2.1 x 1.6 cm. Increase surrounding vasogenic edema and local mass effect with mild compression the right lateral ventricle without evidence of midline shift.  Interval development of blood within the deep dependent aspect of the right lateral ventricle consistent with breakthrough of the right thalamic hemorrhage into the right lateral ventricle.  No CT evidence of large acute thrombotic infarct.  No intracranial mass lesion separate from above described findings.  IMPRESSION: Increase in size of right thalamic/posterior right internal capsule hematoma now with maximal transverse dimension 2.3 x 2.2 cm versus prior 2.1 x 1.6 cm. Increase surrounding vasogenic edema and local mass effect with mild compression the right lateral ventricle without evidence of midline shift.  Interval development of blood within the deep dependent aspect of the right  lateral ventricle consistent with breakthrough of the right thalamic hemorrhage into the right lateral ventricle.  These results were called by telephone at the time of interpretation on 06/07/2015 at 7:31 am to [facility], patient's nurse who verbally acknowledged these results.   Electronically Signed   By: Genia Del M.D.   On: 06/07/2015 07:31   Ct Head Wo Contrast  06/07/2015   CLINICAL DATA:  Status post fall last night. Patient diagnosed with intracranial hemorrhage 06/05/2015.  EXAM: CT HEAD WITHOUT CONTRAST  TECHNIQUE: Contiguous axial images were obtained from the base of the skull through the vertex without intravenous contrast.  COMPARISON:  Head CT scan 06/05/2015.  FINDINGS: Hemorrhage in the right thalamus is again seen. There is also a small amount of intraventricular blood. The appearance is unchanged. No new abnormality including new hemorrhage, infarct, mass lesion, midline shift or abnormal extra-axial fluid collection is identified. No hydrocephalus or pneumocephalus. The calvarium is intact.  IMPRESSION: Right for thalamic hemorrhage with a small amount of intraventricular blood appears unchanged. No new abnormality is seen.   Electronically Signed   By: Inge Rise M.D.   On: 06/07/2015 07:27   Ct Head Wo Contrast  06/05/2015   CLINICAL DATA:  59 year old male with left-sided weakness. Code stroke.  EXAM: CT HEAD WITHOUT CONTRAST  TECHNIQUE: Contiguous axial images were obtained from the base of the skull through the vertex without intravenous contrast.  COMPARISON:  None.  FINDINGS: There is a 2.1 x 1.6 cm acute intraparenchymal hemorrhage centered at the right thalamus. There is mild adjacent edema. No significant mass effect.  No midline shift.  The ventricles and the sulci are appropriate in size for the patient's age. The The gray-white matter differentiation is preserved.  The visualized paranasal sinuses and mastoid air cells are well aerated. The calvarium is intact.   IMPRESSION: Acute right thalamic hemorrhage.  Critical Value/emergent results were called by telephone at the time of interpretation on 06/05/2015 at 10:39 pm to Dr. Janann Colonel , who verbally acknowledged these results.   Electronically Signed   By: Anner Crete M.D.   On: 06/05/2015 22:41   Ct Angio Chest Pe W/cm &/or Wo Cm  06/19/2015   CLINICAL DATA:  Shortness of Breath  EXAM: CT ANGIOGRAPHY CHEST WITH CONTRAST  TECHNIQUE: Multidetector CT imaging of the chest was performed using the standard protocol during bolus administration of intravenous contrast. Multiplanar CT image reconstructions and MIPs were obtained to evaluate the vascular anatomy.  CONTRAST:  81mL OMNIPAQUE IOHEXOL 350 MG/ML SOLN  COMPARISON:  Chest radiograph June 19, 2015  FINDINGS: There is no demonstrable pulmonary embolus. There is atherosclerotic change in the aortic arch region. There is no thoracic aortic aneurysm or dissection. The visualized great vessels appear unremarkable.  There is patchy bibasilar atelectatic change. There is no frank edema or consolidation. There is a small bulla in the right upper lobe peripherally near the apex.  On axial slice 37 series 6, there is a 7 mm nodular opacity along the minor fissure laterally on the right.  Thyroid appears normal. There is no appreciable thoracic adenopathy.  There are foci of coronary artery calcification. There is left ventricular hypertrophy. The pericardium is not thickened.  In the visualized upper abdomen, there are several scattered subcentimeter liver cysts. The liver contour appears rather nodular. There is a feeding tube extending into the stomach ; tip is not seen an is below the level of imaging.  There are no blastic or lytic bone lesions.  Review of the MIP images confirms the above findings.  IMPRESSION: No demonstrable pulmonary embolus.  Patchy bibasilar atelectasis.  No frank edema or consolidation.  7 mm nodular opacity along the minor fissure on the right  laterally. Followup of this nodular opacity should be based on Fleischner Society guidelines. If the patient is at high risk for bronchogenic carcinoma, follow-up chest CT at 3-45months is recommended. If the patient is at low risk for bronchogenic carcinoma, follow-up chest CT at 6-12 months is recommended. This recommendation follows the consensus statement: Guidelines for Management of Small Pulmonary Nodules Detected on CT Scans: A Statement from the [place] as published in Radiology 2005; 237:395-400.  No appreciable adenopathy.  Foci of coronary artery calcification. There is left ventricular hypertrophy.  Liver contour appears rather nodular. Question a degree of underlying hepatic cirrhosis.   Electronically Signed   By: Lowella Grip III M.D.   On: 06/19/2015 15:30   Dg Chest Port 1 View  06/19/2015   CLINICAL DATA:  Respiratory distress.  EXAM: PORTABLE CHEST - 1 VIEW  COMPARISON:  One-view chest 06/18/2015  FINDINGS: The heart size is exaggerated by low lung volumes. Mild pulmonary vascular congestion is again noted. Minimal right basilar atelectasis is present. The left IJ line has been removed. There is no pneumothorax. A feeding tube courses off the inferior border of the film.  IMPRESSION: 1. Persistent low lung volumes exaggerating the heart size. 2. Mild bibasilar atelectasis is stable. 3. Interval removal of left IJ line without complication.   Electronically Signed   By: Wynetta Fines.D.  On: 06/19/2015 11:44   Dg Chest Port 1 View  06/18/2015   CLINICAL DATA:  Subsequent evaluation airway aspiration  EXAM: PORTABLE CHEST - 1 VIEW  COMPARISON:  06/15/2015  FINDINGS: Endotracheal tube has been removed. Feeding tube is seen extending off the inferior edge of the film. No change left IJ line.  Mild cardiac enlargement. Vascular pattern normal. Mild airspace disease in the medial lung bases bilaterally.  IMPRESSION: Mild bibasilar medial airspace disease most consistent  with atelectasis.   Electronically Signed   By: Skipper Cliche M.D.   On: 06/18/2015 11:52   Dg Chest Port 1 View  06/15/2015   CLINICAL DATA:  Acute respiratory failure, hypoxia, intercerebral hemorrhage, alcoholic dependence and withdrawal  EXAM: PORTABLE CHEST - 1 VIEW  COMPARISON:  Chest x-ray of June 14, 2015  FINDINGS: The lungs are well-expanded and clear. The heart and pulmonary vascularity are normal. The mediastinum is normal in width. The endotracheal tube tip lies 4.7 cm above the carina. The esophagogastric tube tip projects below the inferior margin of the image. The left internal jugular venous catheter tip projects over the midportion of the SVC. The bony thorax is unremarkable.  IMPRESSION: There is no active cardiopulmonary disease.   Electronically Signed   By: David  Martinique M.D.   On: 06/15/2015 07:35   Dg Chest Port 1 View  06/14/2015   CLINICAL DATA:  Acute respiratory failure with hypoxia  EXAM: PORTABLE CHEST - 1 VIEW  COMPARISON:  Radiograph 06/12/2015  FINDINGS: Endotracheal tube and NG tube are unchanged. LEFT central venous line unchanged. Normal cardiac silhouette. No pulmonary edema. No infiltrate. No pneumothorax.  IMPRESSION: 1. Stable support apparatus. 2. No interval change.   Electronically Signed   By: Suzy Bouchard M.D.   On: 06/14/2015 09:49   Dg Chest Port 1 View  06/12/2015   CLINICAL DATA:  Intubated patient, history of intracranial hemorrhage, alcohol withdrawal, acute respiratory failure and hypoxia.  EXAM: PORTABLE CHEST - 1 VIEW  COMPARISON:  Portable chest x-ray of June 11, 2015  FINDINGS: The right lung is adequately inflated and clear. On the left the retrocardiac region remains dense. There remains partial obscuration of the hemidiaphragm. The heart is normal in size. The pulmonary vascularity is not engorged. The endotracheal tube tip lies 3.8 cm above the carina. The esophagogastric tube tip projects below the inferior margin of the image. The left  internal jugular venous catheter tip projects over the midportion of the SVC.  IMPRESSION: Stable appearance of the chest since yesterday's study. There is persistent left lower lobe atelectasis. The support tubes are in reasonable position.   Electronically Signed   By: David  Martinique M.D.   On: 06/12/2015 07:56   Dg Chest Port 1 View  06/11/2015   CLINICAL DATA:  Atelectasis described on previous chest x-ray report. Clinical data from recent chest x-ray indicating acute respiratory failure with hypoxia and alcohol withdrawal.  EXAM: PORTABLE CHEST - 1 VIEW  COMPARISON:  Chest x-ray dated 06/10/2015.  FINDINGS: Lungs are again hyperexpanded suggesting COPD. Dense opacity at the left lung base is unchanged. Lungs otherwise clear. No pleural effusion visualized. No pneumothorax.  Endotracheal tube remains well positioned with tip just above the level of the carina. Nasogastric tube passes below the diaphragm. A left-sided internal jugular central line appears adequately positioned with tip in the superior vena cava.  IMPRESSION: 1. Dense opacity at the left lung base without significant interval change. This is compatible with previous chest x-ray report  description of atelectasis. If febrile, pneumonia could not be completely excluded. 2. Lungs again hyperexpanded suggesting some degree of COPD. 3. No new lung findings. 4. Endotracheal tube and nasogastric tube appear well positioned.   Electronically Signed   By: Franki Cabot M.D.   On: 06/11/2015 08:10   Dg Chest Port 1 View  06/10/2015   CLINICAL DATA:  Intubated patient, acute respiratory failure with hypoxia, alcohol withdrawal  EXAM: PORTABLE CHEST - 1 VIEW  COMPARISON:  Portable chest x-ray of June 08, 2014  FINDINGS: The lungs remain hyperinflated. There remain coarse infrahilar lung markings bilaterally consistent with subsegmental atelectasis. There is no pneumothorax or significant pleural effusion. The cardiac silhouette is normal in size. The  pulmonary vascularity is not engorged.  The endotracheal tube tip lies 4 cm above the carina. The esophagogastric tube tip projects below the inferior margin of the image. The left internal jugular venous catheter tip projects over the midportion of the SVC  IMPRESSION: 1. COPD. Improving left lower lobe atelectasis. There is no CHF or pneumonia. 2. The support tubes are in reasonable position.   Electronically Signed   By: David  Martinique M.D.   On: 06/10/2015 08:02   Dg Chest Port 1 View  06/09/2015   CLINICAL DATA:  Hypoxia  EXAM: PORTABLE CHEST - 1 VIEW  COMPARISON:  June 08, 2015  FINDINGS: Endotracheal tube tip is 5.2 cm above the carina. Central catheter tip is in the superior vena cava. No pneumothorax. There is consolidation in the left lower lobe. Small left effusion. The lungs are otherwise clear. Heart size and pulmonary vascularity are normal. No adenopathy. No bone lesions.  IMPRESSION: Tube and catheter positions as described without pneumothorax. Consolidation left lower lobe with small left effusion.   Electronically Signed   By: Lowella Grip III M.D.   On: 06/09/2015 13:29   Dg Chest Port 1 View  06/08/2015   CLINICAL DATA:  Tobacco abuse.  Intracranial hemorrhage.  EXAM: PORTABLE CHEST - 1 VIEW  COMPARISON:  None.  FINDINGS: The heart size is exaggerated by low lung volumes. Mild pulmonary vascular congestion is evident. No focal airspace disease is present. The radiopaque line is noted over the left neck. I spoke with the patient's nurse. The patient does not have intravenous ine in the left neck. This may be a peripheral IV line projecting over the patient. The visualized soft tissues and bony thorax are unremarkable otherwise.  IMPRESSION: Low lung volumes and mild pulmonary vascular congestion.   Electronically Signed   By: San Morelle M.D.   On: 06/08/2015 07:47   Dg Abd Portable 1v  06/18/2015   CLINICAL DATA:  Feeding tube replacement.  EXAM: PORTABLE ABDOMEN - 1 VIEW   COMPARISON:  One-view abdomen 06/17/2015.  FINDINGS: A single view the abdomen demonstrates the tip the small bore feeding tube in the distal stomach. The bowel gas pattern is otherwise unremarkable. The lung bases are clear.  IMPRESSION: The tip of a small bore feeding tube is in the distal stomach.   Electronically Signed   By: San Morelle M.D.   On: 06/18/2015 07:21   Dg Abd Portable 1v  06/17/2015   CLINICAL DATA:  Enteric tube placement.  EXAM: PORTABLE ABDOMEN - 1 VIEW  COMPARISON:  Earlier 06/17/2015.  FINDINGS: Exam demonstrates an enteric feeding tube with tip over the stomach in the left mid abdomen. Bowel gas pattern is nonobstructive. Remainder of the exam is unchanged.  IMPRESSION: No acute findings.  Enteric  tube with tip over the stomach in the left mid abdomen.   Electronically Signed   By: Marin Olp M.D.   On: 06/17/2015 16:42   Dg Abd Portable 1v  06/17/2015   CLINICAL DATA:  Feeding tube placement  EXAM: PORTABLE ABDOMEN - 1 VIEW  COMPARISON:  June 09, 2015  FINDINGS: Feeding tube tip is in the stomach. Bowel gas pattern unremarkable. No obstruction or free air is seen on this supine examination. Lung bases are clear.  IMPRESSION: Feeding tube tip in stomach.  Bowel gas pattern unremarkable.   Electronically Signed   By: Lowella Grip III M.D.   On: 06/17/2015 12:36   Dg Abd Portable 1v  06/09/2015   CLINICAL DATA:  Orogastric tube placement  EXAM: PORTABLE ABDOMEN - 1 VIEW  COMPARISON:  None.  FINDINGS: Orogastric tube tip is in the proximal stomach. The side port is at the gastroesophageal junction. There is contrast in the distal half of the colon. There is no bowel dilatation or air-fluid level suggesting obstruction. No free air is seen on this supine examination.  IMPRESSION: Orogastric tube tip in proximal stomach. Side-port is at the gastroesophageal junction. Advise advancing tube approximately 6 cm to confirm that tube tip and side port are both well within the  stomach. Overall bowel gas pattern unremarkable.   Electronically Signed   By: Lowella Grip III M.D.   On: 06/09/2015 16:34   Dg Addison Bailey G Tube Plc W/fl-no Rad  06/24/2015   CLINICAL DATA:    NASO G TUBE PLACEMENT WITH FLUORO  Fluoroscopy was utilized by the requesting physician.  No radiographic  interpretation.    Dg Swallowing Func-speech Pathology  06/06/2015    Objective Swallowing Evaluation: Modified Barium Swallow Study    Patient Details  Name: Coltan Spinello MRN: [id number] Date of Birth: 28-Mar-1956  Today's Date: 06/06/2015 Time: SLP Start Time (ACUTE ONLY): 1245-SLP Stop Time (ACUTE ONLY): 1306 SLP Time Calculation (min) (ACUTE ONLY): 21 min  Past Medical History: No past medical history on file. Past Surgical History: No past surgical history on file. HPI:  Other Pertinent Information: Bodie Abernethy is an 59 y.o. male with no reported  past medical history (hasn't been to doctors in years) presents with acute  onset of slurred speech and left sided weakness. LSW 2130 when he noted he  was unable to stand or use his left side. EMS called, upon arrival noted  BP of 240/140. Code stroke activated. CT head imaging reviewed, shows  right sided thalamic/BG ICH. Denies taking any blood thinners  No Data Recorded  Assessment / Plan / Recommendation CHL IP CLINICAL IMPRESSIONS 06/06/2015  Therapy Diagnosis Mild to Moderate pharyngeal phase dysphagia;Moderate  oral phase dysphagia;  Clinical Impression Pt exhibiting moderate oral pharyngeal dysphagia which  is exacerbated by his impulsivity and agitation. Silent aspiration noted  when patient rapidly consumed multiple consecutive cup sips of thin  liquids. Trace aspirates remained below true vocal cords despite cueing  for voilitional cough. No penetration or aspiration noted in further  trials of thin liquid by cup and straw when sip size and rate where better  controlled. Pts poor condition of dentition along with residual  left  sided decreased lingual and labial  sensation, decreased left sided   Lingual and labial range of motion prohibit effective mastication of solid  POs. Recommend dysphagia 2 diet and thin liquids with strict aspiration  precautions. Medicines whole with thin liquid. Full supervision  recommended for adequate carry over of  strategies. ST follow up warranted.         CHL IP TREATMENT RECOMMENDATION 06/06/2015  Treatment Recommendations Therapy as outlined in treatment plan below     CHL IP DIET RECOMMENDATION 06/06/2015  SLP Diet Recommendations Dysphagia 2 (Fine chop);Thin  Liquid Administration via (None)  Medication Administration Whole meds with liquid  Compensations Minimize environmental distractions;Slow rate;Small  sips/bites;Check for pocketing  Postural Changes and/or Swallow Maneuvers (None)     CHL IP OTHER RECOMMENDATIONS 06/06/2015  Recommended Consults (None)  Oral Care Recommendations Oral care BID  Other Recommendations (None)     No flowsheet data found.   CHL IP FREQUENCY AND DURATION 06/06/2015  Speech Therapy Frequency (ACUTE ONLY) min 2x/week  Treatment Duration 2 weeks     Pertinent Vitals/Pain        Arvil Chaco MA, CCC-SLP Acute Care Speech Language Pathologist       Levi Aland 06/06/2015, 1:44 PM     Subjective: Unable. Per RN, more alert  Objective: Filed Vitals:   06/26/15 0153 06/26/15 0500 06/26/15 0603 06/26/15 1039  BP: 148/78  142/87 174/93  Pulse: 73  76 77  Temp: 97.6 F (36.4 C)  98 F (36.7 C) 97.7 F (36.5 C)  TempSrc: Oral  Oral Axillary  Resp: 20  20 20   Height:      Weight:  69.128 kg (152 lb 6.4 oz)    SpO2: 97%  95% 97%    Intake/Output Summary (Last 24 hours) at 06/26/15 1257 Last data filed at 06/26/15 1005  Gross per 24 hour  Intake    700 ml  Output   1475 ml  Net   -775 ml   Weight change: -3.135 kg (-6 lb 14.6 oz) Exam:   General:  Asleep. Arousable. nonverbal  Cardiovascular: RRR, S1/S2, no rubs, no gallops  Respiratory: CTA bilaterally, no wheezing, no crackles,  no rhonchi  Abdomen: abdominal binder  Extremities: No edema  Data Reviewed: Basic Metabolic Panel:  Recent Labs Lab 06/21/15 0800 06/22/15 0759 06/23/15 1006 06/24/15 0550 06/26/15 0646  NA 147* 141 139 139 135  K 4.1 3.6 3.6 3.6 3.4*  CL 116* 109 109 109 106  CO2 24 23 23 23  20*  GLUCOSE 123* 176* 129* 104* 105*  BUN 31* 24* 24* 22* 17  CREATININE 0.92 0.76 0.85 0.73 0.64  CALCIUM 8.4* 8.1* 8.3* 8.0* 7.9*  MG 2.2  --   --   --   --    Liver Function Tests:  Recent Labs Lab 06/20/15 0443 06/26/15 0646  AST 173* 143*  ALT 290* 204*  ALKPHOS 54 66  BILITOT 0.9 0.8  PROT 5.7* 5.6*  ALBUMIN 2.5* 2.4*   No results for input(s): LIPASE, AMYLASE in the last 168 hours. No results for input(s): AMMONIA in the last 168 hours. CBC:  Recent Labs Lab 06/20/15 0443 06/21/15 0800 06/26/15 0646  WBC 6.4 7.6 6.7  NEUTROABS  --   --  4.9  HGB 12.4* 12.0* 12.1*  HCT 39.4 37.8* 35.7*  MCV 99.5 99.2 92.5  PLT 95* 90* 70*   Cardiac Enzymes:  Recent Labs Lab 06/19/15 1415 06/19/15 1939 06/20/15 1210 06/20/15 1657  TROPONINI 0.09* 0.09* 0.08* 0.07*   BNP: Invalid input(s): POCBNP CBG:  Recent Labs Lab 06/25/15 2111 06/26/15 0041 06/26/15 0404 06/26/15 0801 06/26/15 1204  GLUCAP 96 106* 92 117* 121*    No results found for this or any previous visit (from the past 240 hour(s)).   Scheduled Meds: .  amLODipine  10 mg Oral Daily  . ampicillin-sulbactam (UNASYN) IV  3 g Intravenous Q6H  . antiseptic oral rinse  7 mL Mouth Rinse q12n4p  . chlorhexidine  15 mL Mouth Rinse BID  . cloNIDine  0.2 mg Oral TID  . feeding supplement (PRO-STAT SUGAR FREE 64)  30 mL Per Tube Daily  . folic acid  1 mg Oral Daily  . free water  300 mL Per Tube QID  . heparin subcutaneous  5,000 Units Subcutaneous 3 times per day  . hydrALAZINE  100 mg Per Tube 3 times per day  . insulin aspart  2-6 Units Subcutaneous 6 times per day  . labetalol  300 mg Oral TID  . multivitamin   5 mL Per Tube Daily  . senna-docusate  1 tablet Oral BID  . thiamine  100 mg Oral Daily   Continuous Infusions: . feeding supplement (JEVITY 1.5 CAL/FIBER)     15 min   Delfina Redwood, MD [facility]  [email or web address] Password [facility] 06/26/2015, 12:57 PM   LOS: 21 days

## 2015-06-26 NOTE — Progress Notes (Signed)
STROKE TEAM PROGRESS NOTE   HISTORY Mike Rowe is an 59 y.o. male with no reported past medical history (hasn't been to doctors in years) presents with acute onset of slurred speech and left sided weakness. LSW 2130 on 06/05/2015 when he noted he was unable to stand or use his left side. EMS called, upon arrival noted BP of 240/140. Code stroke activated. CT head imaging reviewed, shows right sided thalamic/BG ICH. Denies taking any blood thinners. He denies any past medical history including hypertension. Denies any surgical history. Denies alcohol or drug abuse. Modified Rankin: 0 ICH Score: 1. Initial NIHSS of 12   SUBJECTIVE (INTERVAL HISTORY):   The patient remains poorly responsive. PEG placed today. Skilled nursing facility placement planned.   Temp:  [97.6 F (36.4 C)-98.7 F (37.1 C)] 97.7 F (36.5 C) (08/12 1039) Pulse Rate:  [65-77] 77 (08/12 1039) Cardiac Rhythm:  [-] Normal sinus rhythm (08/11 1936) Resp:  [13-20] 20 (08/12 1039) BP: (138-174)/(66-93) 174/93 mmHg (08/12 1039) SpO2:  [95 %-100 %] 97 % (08/12 1039) Weight:  [69.128 kg (152 lb 6.4 oz)] 69.128 kg (152 lb 6.4 oz) (08/12 0500)   Recent Labs Lab 06/25/15 2111 06/26/15 0041 06/26/15 0404 06/26/15 0801 06/26/15 1204  GLUCAP 96 106* 92 117* 121*    Recent Labs Lab 06/21/15 0800 06/22/15 0759 06/23/15 1006 06/24/15 0550 06/26/15 0646  NA 147* 141 139 139 135  K 4.1 3.6 3.6 3.6 3.4*  CL 116* 109 109 109 106  CO2 24 23 23 23  20*  GLUCOSE 123* 176* 129* 104* 105*  BUN 31* 24* 24* 22* 17  CREATININE 0.92 0.76 0.85 0.73 0.64  CALCIUM 8.4* 8.1* 8.3* 8.0* 7.9*  MG 2.2  --   --   --   --     Recent Labs Lab 06/20/15 0443 06/26/15 0646  AST 173* 143*  ALT 290* 204*  ALKPHOS 54 66  BILITOT 0.9 0.8  PROT 5.7* 5.6*  ALBUMIN 2.5* 2.4*    Recent Labs Lab 06/20/15 0443 06/21/15 0800 06/26/15 0646  WBC 6.4 7.6 6.7  NEUTROABS  --   --  4.9  HGB 12.4* 12.0* 12.1*  HCT 39.4 37.8* 35.7*  MCV 99.5  99.2 92.5  PLT 95* 90* 70*    Recent Labs Lab 06/19/15 1415 06/19/15 1939 06/20/15 1210 06/20/15 1657  TROPONINI 0.09* 0.09* 0.08* 0.07*   No results for input(s): LABPROT, INR in the last 72 hours. No results for input(s): COLORURINE, LABSPEC, [place], GLUCOSEU, HGBUR, BILIRUBINUR, KETONESUR, PROTEINUR, UROBILINOGEN, NITRITE, LEUKOCYTESUR in the last 72 hours.  Invalid input(s): APPERANCEUR     Component Value Date/Time   CHOL 153 06/16/2015 0345   TRIG 176* 06/16/2015 1035   HDL 16* 06/16/2015 0345   CHOLHDL 9.6 06/16/2015 0345   VLDL 40 06/16/2015 0345   LDLCALC 97 06/16/2015 0345   Lab Results  Component Value Date   HGBA1C 5.2 06/07/2015      Component Value Date/Time   LABOPIA NONE DETECTED 06/06/2015 0100   COCAINSCRNUR POSITIVE* 06/06/2015 0100   LABBENZ NONE DETECTED 06/06/2015 0100   AMPHETMU NONE DETECTED 06/06/2015 0100   THCU NONE DETECTED 06/06/2015 0100   LABBARB NONE DETECTED 06/06/2015 0100    No results for input(s): ETH in the last 168 hours.   CT HEAD with CONSTRAST  06/05/2015    Acute right thalamic hemorrhage.    06/06/2015    ( Pre Fall ) Increase in size of right thalamic/posterior right internal capsule hematoma now with maximal  transverse dimension 2.3 x 2.2 cm versus prior 2.1 x 1.6 cm.  Increase surrounding vasogenic edema and local mass effect with mild compression the right lateral ventricle without evidence of midline shift. Interval development of blood within the deep dependent aspect of the right lateral ventricle consistent with breakthrough of the right thalamic hemorrhage into the right lateral ventricle.  06/06/2015  ( Post fall ) Right for thalamic hemorrhage with a small amount of intraventricular blood appears unchanged. No new abnormality is seen.  06/15/2015 Essentially no change compared to recent prior study. Hemorrhagic lesion measuring 4.5 x 3.6 arising from the right basal ganglia with surrounding edema, stable.  Midline shift of the third ventricle toward the left is stable. Slight ventricular prominence with layering hemorrhage in the occipital horns of the lateral ventricles remain without appreciable change. No change in overall ventricular size and configuration. No new infarct compared to recent prior Study.  06/19/15  Limited assessment due to patient positioning within the CT scanner. Evolving RIGHT thalamic hemorrhage with intraventricular extension, stable moderate hydrocephalus.  Carotid Ultrasound Technically limited due to left IJ line, pt position, and movement. Visualized bilaterally the bifurcation, ICA, and ICA. Appears to be 1-39% ICA stenosis.  2D echo - Left ventricle: The cavity size was normal. Wall thickness was increased in a pattern of mild LVH. Systolic function was normal.The estimated ejection fraction was in the range of 60% to 65%.Wall motion was normal; there were no regional wall motionabnormalities. Doppler parameters are consistent with abnormalleft ventricular relaxation (grade 1 diastolic dysfunction). Impressions:  No cardiac source of emboli was indentified.   PHYSICAL EXAM  Temp:  [97.6 F (36.4 C)-98.7 F (37.1 C)] 97.7 F (36.5 C) (08/12 1039) Pulse Rate:  [65-77] 77 (08/12 1039) Resp:  [13-20] 20 (08/12 1039) BP: (138-174)/(66-93) 174/93 mmHg (08/12 1039) SpO2:  [95 %-100 %] 97 % (08/12 1039) Weight:  [69.128 kg (152 lb 6.4 oz)] 69.128 kg (152 lb 6.4 oz) (08/12 0500)  General - Well nourished, well developed, intubated and not following commands.  Ophthalmologic - Fundi not visualized due to noncooperation.  Cardiovascular - Regular rate and rhythm.  Neuro -  sleepy.. Pt opens eyes on voice and follows some simple commands, such as wiggle toes and showing fingers. PERRL, right sided gaze preference, doll's eyes present, positive corneal and gag and cough, breathing over the vent. On pain stimulation, left UE and LE withdraw to pain, right UE and LE  spontaneous movement. LUE increased muscle tone. Babinski positive on the left. DTR decreased on the left. Sensation, coordination and gait not tested.   ASSESSMENT [name] is a 59 y.o. male with no significant past medical history no regular medical follow-up presenting with left hemiparesis really elevated blood pressures. He did not receive IV t-PA due to acute right thalamic hemorrhage.   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  2D Echo - unremarkable  LDL 97  HgbA1c 5.2  SCDs for VTE prophylaxis Diet NPO time specified.   no antithrombotic prior to admission  Ongoing aggressive stroke risk factor management  Therapy recommendations: SNF recommended  Disposition:  Pending. Medically ready for discharge once feeding route established. Dr. Leonie Man discussed with SW. (has family but cannot find) SW has secured a bed. SW team pursuing guardianship. Await tube placement.  Patient does not have capacity to make own decisions.  Given R brain stroke, pt is impulsive, inconsistently follows 1-step commands, poor awareness of environment, decreased awareness of deficits (neglect), psychomotor delay, impaired communication and lethargy  Cerebral edema Induced Hypernatremia  Off 3% saline   Na normalized  Cocaine abuse  Positive on UDS  Malignant Hypertension  Home meds:  No antihypertensives medications prior to admission  BP 217/123 on arrival  On norvasc, clonidine, hydralazine, labetalol  Continues to improve  Hyperlipidemia  LDL 97  No home meds  Hold off statins due to elevated liver enzymes.  Tachypnea/respiratory distress/acute respiratory failure  On 8/5 pt w/ O2 desat with tachypnea  Suspect the patient has aspiration pneumonitis given  his encephalopathy and dysphagia  BNP t149.3 (H)  CXR - low lung volumes with mild bibasilar atelectasis - stable  Lactic acid 1.6 within normal range  Troponin 0.09 (H) - every 6 hours 3 - mildly elevated 3  CMP - sodium 153- potassium 3.4- chloride- 126 CO2- 21 BUN- 41 creatinine- 0.96 AST- 236 ALT- 346  CBC - WBCs 7.0  Treated with Unasyn, D # 7/7   Stable - WBCs 6.7 - afebrile   Thrombocytopenia  Platelets 70,000 today. Recheck tomorrow (Saturday)   Dysphagia, malnutrition  Continue NG tube feedings   Speech therapy recommends PEG  Trauma consulted for PEG - plan placement 06/25/2015 due to scheduling issues  Will need 2 MDs to agree placement necessary given lack of family for consent  Change to bolus feeds tomorrow. The Inpatient Clinical Dietician already restarted tube feeds at a gradually increasing rate.  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  SNF - Saturday if stable.  Change to bolus feeds tomorrow.   Hospital day # [place] for Pager information 06/26/2015 12:34 PM    I have personally examined this patient, reviewed notes, independently viewed imaging studies, participated in medical decision making and plan of care. I have made any additions or clarifications directly to the above note. Agree with note above.  Plan start using PEG tube today and SNF transfer when bed available. Medically stable for discharge. I have personally examined this patient, reviewed notes, independently viewed imaging studies, participated in medical decision making and plan of care. I have made any additions or clarifications directly to the above note.  Agree with note above.    06/26/2015 12:34 PM To contact Stroke Continuity provider, please refer to [email or web address] After hours, contact General Neurology

## 2015-06-27 DIAGNOSIS — I639 Cerebral infarction, unspecified: Secondary | ICD-10-CM

## 2015-06-27 HISTORY — DX: Cerebral infarction, unspecified: I63.9

## 2015-06-27 LAB — GLUCOSE, CAPILLARY: Glucose-Capillary: 122 mg/dL — ABNORMAL HIGH (ref 65–99)

## 2015-06-27 LAB — PLATELET COUNT: Platelets: 83 10*3/uL — ABNORMAL LOW (ref 150–400)

## 2015-06-27 NOTE — Progress Notes (Signed)
STROKE TEAM PROGRESS NOTE   HISTORY Mike Rowe is an 59 y.o. male with no reported past medical history (hasn't been to doctors in years) presents with acute onset of slurred speech and left sided weakness. LSW 2130 on 06/05/2015 when he noted he was unable to stand or use his left side. EMS called, upon arrival noted BP of 240/140. Code stroke activated. CT head imaging reviewed, shows right sided thalamic/BG ICH. Denies taking any blood thinners. He denies any past medical history including hypertension. Denies any surgical history. Denies alcohol or drug abuse. Modified Rankin: 0 ICH Score: 1. Initial NIHSS of 12   SUBJECTIVE (INTERVAL HISTORY):   The patient remains poorly responsive. PEG placed Thursday. Skilled nursing facility placement planned for today.   Temp:  [97.6 F (36.4 C)-98 F (36.7 C)] 97.7 F (36.5 C) (08/13 0951) Pulse Rate:  [55-73] 60 (08/13 0951) Cardiac Rhythm:  [-] Normal sinus rhythm (08/13 0959) Resp:  [16-20] 16 (08/13 0951) BP: (128-145)/(63-91) 128/86 mmHg (08/13 0951) SpO2:  [98 %-100 %] 98 % (08/13 0951) Weight:  [66.906 kg (147 lb 8 oz)] 66.906 kg (147 lb 8 oz) (08/13 0500)   Recent Labs Lab 06/26/15 0404 06/26/15 0801 06/26/15 1204 06/26/15 1647 06/27/15 0629  GLUCAP 92 117* 121* 101* 122*    Recent Labs Lab 06/21/15 0800 06/22/15 0759 06/23/15 1006 06/24/15 0550 06/26/15 0646  NA 147* 141 139 139 135  K 4.1 3.6 3.6 3.6 3.4*  CL 116* 109 109 109 106  CO2 24 23 23 23  20*  GLUCOSE 123* 176* 129* 104* 105*  BUN 31* 24* 24* 22* 17  CREATININE 0.92 0.76 0.85 0.73 0.64  CALCIUM 8.4* 8.1* 8.3* 8.0* 7.9*  MG 2.2  --   --   --   --     Recent Labs Lab 06/26/15 0646  AST 143*  ALT 204*  ALKPHOS 66  BILITOT 0.8  PROT 5.6*  ALBUMIN 2.4*    Recent Labs Lab 06/21/15 0800 06/26/15 0646 06/27/15 0611  WBC 7.6 6.7  --   NEUTROABS  --  4.9  --   HGB 12.0* 12.1*  --   HCT 37.8* 35.7*  --   MCV 99.2 92.5  --   PLT 90* 70* 83*     Recent Labs Lab 06/20/15 1210 06/20/15 1657  TROPONINI 0.08* 0.07*   No results for input(s): LABPROT, INR in the last 72 hours. No results for input(s): COLORURINE, LABSPEC, [place], GLUCOSEU, HGBUR, BILIRUBINUR, KETONESUR, PROTEINUR, UROBILINOGEN, NITRITE, LEUKOCYTESUR in the last 72 hours.  Invalid input(s): APPERANCEUR     Component Value Date/Time   CHOL 153 06/16/2015 0345   TRIG 176* 06/16/2015 1035   HDL 16* 06/16/2015 0345   CHOLHDL 9.6 06/16/2015 0345   VLDL 40 06/16/2015 0345   LDLCALC 97 06/16/2015 0345   Lab Results  Component Value Date   HGBA1C 5.2 06/07/2015      Component Value Date/Time   LABOPIA NONE DETECTED 06/06/2015 0100   COCAINSCRNUR POSITIVE* 06/06/2015 0100   LABBENZ NONE DETECTED 06/06/2015 0100   AMPHETMU NONE DETECTED 06/06/2015 0100   THCU NONE DETECTED 06/06/2015 0100   LABBARB NONE DETECTED 06/06/2015 0100    No results for input(s): ETH in the last 168 hours.   CT HEAD with CONSTRAST  06/05/2015    Acute right thalamic hemorrhage.    06/06/2015    ( Pre Fall ) Increase in size of right thalamic/posterior right internal capsule hematoma now with maximal transverse dimension 2.3 x  2.2 cm versus prior 2.1 x 1.6 cm.  Increase surrounding vasogenic edema and local mass effect with mild compression the right lateral ventricle without evidence of midline shift. Interval development of blood within the deep dependent aspect of the right lateral ventricle consistent with breakthrough of the right thalamic hemorrhage into the right lateral ventricle.  06/06/2015  ( Post fall ) Right for thalamic hemorrhage with a small amount of intraventricular blood appears unchanged. No new abnormality is seen.  06/15/2015 Essentially no change compared to recent prior study. Hemorrhagic lesion measuring 4.5 x 3.6 arising from the right basal ganglia with surrounding edema, stable. Midline shift of the third ventricle toward the left is stable. Slight  ventricular prominence with layering hemorrhage in the occipital horns of the lateral ventricles remain without appreciable change. No change in overall ventricular size and configuration. No new infarct compared to recent prior Study.  06/19/15  Limited assessment due to patient positioning within the CT scanner. Evolving RIGHT thalamic hemorrhage with intraventricular extension, stable moderate hydrocephalus.  Carotid Ultrasound Technically limited due to left IJ line, pt position, and movement. Visualized bilaterally the bifurcation, ICA, and ICA. Appears to be 1-39% ICA stenosis.  2D echo - Left ventricle: The cavity size was normal. Wall thickness was increased in a pattern of mild LVH. Systolic function was normal.The estimated ejection fraction was in the range of 60% to 65%.Wall motion was normal; there were no regional wall motionabnormalities. Doppler parameters are consistent with abnormalleft ventricular relaxation (grade 1 diastolic dysfunction). Impressions:  No cardiac source of emboli was indentified.   PHYSICAL EXAM  Temp:  [97.6 F (36.4 C)-98 F (36.7 C)] 97.7 F (36.5 C) (08/13 0951) Pulse Rate:  [55-73] 60 (08/13 0951) Resp:  [16-20] 16 (08/13 0951) BP: (128-145)/(63-91) 128/86 mmHg (08/13 0951) SpO2:  [98 %-100 %] 98 % (08/13 0951) Weight:  [66.906 kg (147 lb 8 oz)] 66.906 kg (147 lb 8 oz) (08/13 0500)  General - Well nourished, well developed, intubated and not following commands.  Ophthalmologic - Fundi not visualized due to noncooperation.  Cardiovascular - Regular rate and rhythm.  Neuro - eyes are closed. He owns his eyes to deep painful stimuli. He does not follow commands. There is a right gaze preference which can be overcome passively. Pupils are midposition and reactive. There is a left hemiplegia. Moves his right side spontaneously.  Brain CT scan is reviewed in person and shows a large right basal ganglia hemorrhage with significant mass effect and  edema extending to the temporal lobe surrounding frontal white matter area and the thalamus causing effacement of the third ventricle and lateral ventricle on the left side.  ASSESSMENT [name] is a 59 y.o. male with no significant past medical history no regular medical follow-up presenting with left hemiparesis really elevated blood pressures. He did not receive IV t-PA due to acute right thalamic hemorrhage.   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  2D Echo - unremarkable  LDL 97  HgbA1c 5.2  SCDs for VTE prophylaxis Diet NPO time specified.   no antithrombotic prior to admission  Ongoing aggressive stroke risk factor management  Therapy recommendations: SNF recommended  Disposition:  Medically ready for discharge once feeding route established.   Patient does not have capacity to make own decisions.  Given R brain stroke, pt is impulsive, inconsistently follows 1-step commands, poor awareness of environment, decreased awareness of deficits (neglect), psychomotor delay, impaired communication and lethargy  Cerebral edema Induced Hypernatremia  Off 3% saline   Na normalized  Cocaine abuse  Positive on UDS  Malignant Hypertension  Home meds:  No antihypertensives medications prior to admission  BP 217/123 on arrival  On norvasc, clonidine, hydralazine, labetalol  Continues to improve  Hyperlipidemia  LDL 97  No home meds  Hold off statins due to elevated liver enzymes.  Tachypnea/respiratory distress/acute respiratory failure  On 8/5 pt w/ O2 desat with tachypnea  Suspect the patient has aspiration pneumonitis given his encephalopathy and dysphagia  BNP t149.3 (H)  CXR - low lung volumes with mild bibasilar atelectasis -  stable  Lactic acid 1.6 within normal range  Troponin 0.09 (H) - every 6 hours 3 - mildly elevated 3  CMP - sodium 153- potassium 3.4- chloride- 126 CO2- 21 BUN- 41 creatinine- 0.96 AST- 236 ALT- 346  CBC - WBCs 7.0  Treated with Unasyn, D # 7/7   Stable - WBCs 6.7 - afebrile   Thrombocytopenia  Platelets 70,000 today. Recheck tomorrow (Saturday)   Dysphagia, malnutrition  Continue NG tube feedings   Speech therapy recommends PEG  Trauma consulted for PEG - plan placement 06/25/2015 due to scheduling issues  Will need 2 MDs to agree placement necessary given lack of family for consent  Change to bolus feeds tomorrow. The Inpatient Clinical Dietician already restarted tube feeds at a gradually increasing rate.  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  SNF - Today  Change to bolus feeds today.   Hospital day # [place] for Pager information 06/27/2015 10:53 AM     I have personally examined this patient, reviewed notes, independently viewed imaging studies, participated in medical decision making and plan of care. I have made any additions or clarifications directly to the above note. Agree with note above.    06/27/2015 10:53 AM To contact Stroke Continuity provider, please refer to [email or web address] After hours, contact General Neurology

## 2015-06-27 NOTE — Clinical Social Work Placement (Signed)
   CLINICAL SOCIAL WORK PLACEMENT  NOTE  Date:  06/27/2015  Patient Details  Name: Mike Rowe MRN: [id number] Date of Birth: 1956-01-10  Clinical Social Work is seeking post-discharge placement for this patient at the [place] level of care (*CSW will initial, date and re-position this form in  chart as items are completed):  No   Patient/family provided with [place] Work Department's list of facilities offering this level of care within the geographic area requested by the patient (or if unable, by the patient's family).  No   Patient/family informed of their freedom to choose among providers that offer the needed level of care, that participate in Medicare, Medicaid or managed care program needed by the patient, have an available bed and are willing to accept the patient.  No   Patient/family informed of [facility]'s ownership interest in [facility] and [facility], as well as of the fact that they are under no obligation to receive care at these facilities.  PASRR submitted to EDS on 06/15/15     PASRR number received on 06/15/15     Existing PASRR number confirmed on       FL2 transmitted to all facilities in geographic area requested by pt/family on 06/15/15     FL2 transmitted to all facilities within larger geographic area on 06/15/15     Patient informed that his/her managed care company has contracts with or will negotiate with certain facilities, including the following:            Patient/family informed of bed offers received.  Patient chooses bed at  [facility] and [place] recommends and patient chooses bed at  [facility]    Patient to be transferred to  [facility] and [place] on  06/27/15.  Patient to be transferred to facility by  [facility]     Patient family notified on   NA of transfer.  Name of family member notified:   NA     PHYSICIAN Please sign FL2     Additional Comment:     _______________________________________________ Christene Lye, LCSW 06/27/2015, 1:36 PM

## 2015-06-28 ENCOUNTER — Inpatient Hospital Stay (HOSPITAL_COMMUNITY): Payer: Medicaid Other | Admitting: Anesthesiology

## 2015-06-28 ENCOUNTER — Inpatient Hospital Stay (HOSPITAL_COMMUNITY)
Admission: EM | Admit: 2015-06-28 | Discharge: 2015-07-03 | DRG: 326 | Disposition: A | Discharge status: Skilled Nursing Facility | Payer: Medicaid Other | Attending: Internal Medicine | Admitting: Internal Medicine

## 2015-06-28 ENCOUNTER — Encounter (HOSPITAL_COMMUNITY): Payer: Self-pay | Admitting: Anesthesiology

## 2015-06-28 ENCOUNTER — Encounter (HOSPITAL_COMMUNITY)
Admission: EM | Disposition: A | Discharge status: Skilled Nursing Facility | Payer: Self-pay | Source: Home / Self Care | Attending: Internal Medicine

## 2015-06-28 DIAGNOSIS — E860 Dehydration: Secondary | ICD-10-CM | POA: Diagnosis present

## 2015-06-28 DIAGNOSIS — B182 Chronic viral hepatitis C: Secondary | ICD-10-CM | POA: Diagnosis present

## 2015-06-28 DIAGNOSIS — G9340 Encephalopathy, unspecified: Secondary | ICD-10-CM | POA: Diagnosis present

## 2015-06-28 DIAGNOSIS — K9423 Gastrostomy malfunction: Secondary | ICD-10-CM

## 2015-06-28 DIAGNOSIS — F1410 Cocaine abuse, uncomplicated: Secondary | ICD-10-CM

## 2015-06-28 DIAGNOSIS — R1310 Dysphagia, unspecified: Secondary | ICD-10-CM | POA: Diagnosis present

## 2015-06-28 DIAGNOSIS — I619 Nontraumatic intracerebral hemorrhage, unspecified: Secondary | ICD-10-CM | POA: Diagnosis present

## 2015-06-28 DIAGNOSIS — Z794 Long term (current) use of insulin: Secondary | ICD-10-CM | POA: Diagnosis not present

## 2015-06-28 DIAGNOSIS — D62 Acute posthemorrhagic anemia: Secondary | ICD-10-CM | POA: Diagnosis not present

## 2015-06-28 DIAGNOSIS — E785 Hyperlipidemia, unspecified: Secondary | ICD-10-CM | POA: Diagnosis present

## 2015-06-28 DIAGNOSIS — R748 Abnormal levels of other serum enzymes: Secondary | ICD-10-CM | POA: Diagnosis present

## 2015-06-28 DIAGNOSIS — I10 Essential (primary) hypertension: Secondary | ICD-10-CM | POA: Diagnosis present

## 2015-06-28 DIAGNOSIS — I248 Other forms of acute ischemic heart disease: Secondary | ICD-10-CM | POA: Diagnosis present

## 2015-06-28 DIAGNOSIS — Z79899 Other long term (current) drug therapy: Secondary | ICD-10-CM

## 2015-06-28 DIAGNOSIS — E43 Unspecified severe protein-calorie malnutrition: Secondary | ICD-10-CM | POA: Diagnosis present

## 2015-06-28 DIAGNOSIS — Z431 Encounter for attention to gastrostomy: Secondary | ICD-10-CM | POA: Diagnosis present

## 2015-06-28 DIAGNOSIS — K659 Peritonitis, unspecified: Secondary | ICD-10-CM | POA: Diagnosis present

## 2015-06-28 DIAGNOSIS — D696 Thrombocytopenia, unspecified: Secondary | ICD-10-CM | POA: Diagnosis present

## 2015-06-28 DIAGNOSIS — I69354 Hemiplegia and hemiparesis following cerebral infarction affecting left non-dominant side: Secondary | ICD-10-CM | POA: Diagnosis not present

## 2015-06-28 HISTORY — PX: PEG PLACEMENT: SHX5437

## 2015-06-28 HISTORY — PX: LAPAROTOMY: SHX154

## 2015-06-28 LAB — CBC WITH DIFFERENTIAL/PLATELET
BASOS ABS: 0 10*3/uL (ref 0.0–0.1)
Basophils Relative: 0 % (ref 0–1)
Eosinophils Absolute: 0.1 10*3/uL (ref 0.0–0.7)
Eosinophils Relative: 1 % (ref 0–5)
HCT: 39.9 % (ref 39.0–52.0)
HEMOGLOBIN: 14.1 g/dL (ref 13.0–17.0)
LYMPHS PCT: 11 % — AB (ref 12–46)
Lymphs Abs: 1 10*3/uL (ref 0.7–4.0)
MCH: 32.3 pg (ref 26.0–34.0)
MCHC: 35.3 g/dL (ref 30.0–36.0)
MCV: 91.5 fL (ref 78.0–100.0)
Monocytes Absolute: 0.5 10*3/uL (ref 0.1–1.0)
Monocytes Relative: 5 % (ref 3–12)
NEUTROS ABS: 7.9 10*3/uL — AB (ref 1.7–7.7)
Neutrophils Relative %: 83 % — ABNORMAL HIGH (ref 43–77)
Platelets: 82 10*3/uL — ABNORMAL LOW (ref 150–400)
RBC: 4.36 MIL/uL (ref 4.22–5.81)
RDW: 13.6 % (ref 11.5–15.5)
WBC: 9.5 10*3/uL (ref 4.0–10.5)

## 2015-06-28 SURGERY — LAPAROTOMY, EXPLORATORY
Anesthesia: General | Site: Abdomen

## 2015-06-28 MED ORDER — FENTANYL CITRATE (PF) 100 MCG/2ML IJ SOLN
INTRAMUSCULAR | Status: DC | PRN
  Administered 2015-06-28 (×3): 50 ug via INTRAVENOUS
  Administered 2015-06-28: 100 ug via INTRAVENOUS

## 2015-06-28 MED ORDER — METOPROLOL TARTRATE 1 MG/ML IV SOLN
5.0000 mg | Freq: Four times a day (QID) | INTRAVENOUS | Status: DC
  Administered 2015-06-28 – 2015-06-30 (×8): 5 mg via INTRAVENOUS
  Filled 2015-06-28 (×8): qty 5

## 2015-06-28 MED ORDER — PROMETHAZINE HCL 25 MG/ML IJ SOLN: 6.2500 mg | INTRAMUSCULAR | Status: DC | PRN

## 2015-06-28 MED ORDER — PNEUMOCOCCAL VAC POLYVALENT 25 MCG/0.5ML IJ INJ
0.5000 mL | INJECTION | INTRAMUSCULAR | Status: AC
  Administered 2015-06-29: 0.5 mL via INTRAMUSCULAR
  Filled 2015-06-28: qty 0.5

## 2015-06-28 MED ORDER — ROCURONIUM BROMIDE 100 MG/10ML IV SOLN
INTRAVENOUS | Status: DC | PRN
  Administered 2015-06-28: 50 mg via INTRAVENOUS

## 2015-06-28 MED ORDER — ONDANSETRON HCL 4 MG PO TABS: 4.0000 mg | ORAL_TABLET | Freq: Four times a day (QID) | ORAL | Status: DC | PRN

## 2015-06-28 MED ORDER — ROCURONIUM BROMIDE 50 MG/5ML IV SOLN
INTRAVENOUS | Status: AC
  Filled 2015-06-28: qty 1

## 2015-06-28 MED ORDER — ALBUMIN HUMAN 5 % IV SOLN
INTRAVENOUS | Status: DC | PRN
  Administered 2015-06-28: 13:00:00 via INTRAVENOUS

## 2015-06-28 MED ORDER — HYDROMORPHONE HCL 1 MG/ML IJ SOLN
0.2500 mg | INTRAMUSCULAR | Status: DC | PRN
  Administered 2015-06-28 (×4): 0.5 mg via INTRAVENOUS

## 2015-06-28 MED ORDER — GLYCOPYRROLATE 0.2 MG/ML IJ SOLN
INTRAMUSCULAR | Status: DC | PRN
  Administered 2015-06-28: 0.6 mg via INTRAVENOUS

## 2015-06-28 MED ORDER — LIDOCAINE HCL (CARDIAC) 20 MG/ML IV SOLN
INTRAVENOUS | Status: DC | PRN
  Administered 2015-06-28: 50 mg via INTRAVENOUS
  Administered 2015-06-28: 10 mg via INTRAVENOUS

## 2015-06-28 MED ORDER — LIDOCAINE HCL (CARDIAC) 20 MG/ML IV SOLN
INTRAVENOUS | Status: AC
  Filled 2015-06-28: qty 5

## 2015-06-28 MED ORDER — SODIUM CHLORIDE 0.9 % IV SOLN
3.0000 g | Freq: Four times a day (QID) | INTRAVENOUS | Status: DC
  Administered 2015-06-28 – 2015-07-03 (×20): 3 g via INTRAVENOUS
  Filled 2015-06-28 (×24): qty 3

## 2015-06-28 MED ORDER — MIDAZOLAM HCL 5 MG/5ML IJ SOLN
INTRAMUSCULAR | Status: DC | PRN
  Administered 2015-06-28: 2 mg via INTRAVENOUS

## 2015-06-28 MED ORDER — ONDANSETRON HCL 4 MG/2ML IJ SOLN: 4.0000 mg | Freq: Four times a day (QID) | INTRAMUSCULAR | Status: DC | PRN

## 2015-06-28 MED ORDER — 0.9 % SODIUM CHLORIDE (POUR BTL) OPTIME
TOPICAL | Status: DC | PRN
  Administered 2015-06-28 (×3): 1000 mL

## 2015-06-28 MED ORDER — ONDANSETRON HCL 4 MG/2ML IJ SOLN
INTRAMUSCULAR | Status: DC | PRN
  Administered 2015-06-28: 4 mg via INTRAVENOUS

## 2015-06-28 MED ORDER — PIPERACILLIN-TAZOBACTAM 3.375 G IVPB
3.3750 g | Freq: Once | INTRAVENOUS | Status: AC
  Administered 2015-06-28: 3.375 g via INTRAVENOUS
  Filled 2015-06-28: qty 50

## 2015-06-28 MED ORDER — PROPOFOL 10 MG/ML IV BOLUS
INTRAVENOUS | Status: AC
  Filled 2015-06-28: qty 20

## 2015-06-28 MED ORDER — HYDROMORPHONE HCL 1 MG/ML IJ SOLN
INTRAMUSCULAR | Status: AC
  Administered 2015-06-28: 0.5 mg via INTRAVENOUS
  Filled 2015-06-28: qty 2

## 2015-06-28 MED ORDER — LACTATED RINGERS IV SOLN
INTRAVENOUS | Status: DC | PRN
  Administered 2015-06-28: 13:00:00 via INTRAVENOUS

## 2015-06-28 MED ORDER — HYDRALAZINE HCL 20 MG/ML IJ SOLN
5.0000 mg | Freq: Three times a day (TID) | INTRAMUSCULAR | Status: DC
  Administered 2015-06-28 – 2015-06-30 (×6): 5 mg via INTRAVENOUS
  Filled 2015-06-28 (×6): qty 1

## 2015-06-28 MED ORDER — NEOSTIGMINE METHYLSULFATE 10 MG/10ML IV SOLN
INTRAVENOUS | Status: DC | PRN
  Administered 2015-06-28: 4 mg via INTRAVENOUS

## 2015-06-28 MED ORDER — HYDROMORPHONE HCL 1 MG/ML IJ SOLN
0.5000 mg | INTRAMUSCULAR | Status: DC | PRN
  Administered 2015-06-28 – 2015-07-03 (×12): 1 mg via INTRAVENOUS
  Filled 2015-06-28 (×12): qty 1

## 2015-06-28 MED ORDER — ACETAMINOPHEN 325 MG PO TABS: 650.0000 mg | ORAL_TABLET | Freq: Four times a day (QID) | ORAL | Status: DC | PRN

## 2015-06-28 MED ORDER — MIDAZOLAM HCL 2 MG/2ML IJ SOLN
INTRAMUSCULAR | Status: AC
  Filled 2015-06-28: qty 4

## 2015-06-28 MED ORDER — PROPOFOL 10 MG/ML IV BOLUS
INTRAVENOUS | Status: DC | PRN
  Administered 2015-06-28: 200 mg via INTRAVENOUS

## 2015-06-28 MED ORDER — THIAMINE HCL 100 MG/ML IJ SOLN
100.0000 mg | Freq: Every day | INTRAMUSCULAR | Status: DC
  Administered 2015-06-28 – 2015-07-03 (×6): 100 mg via INTRAVENOUS
  Filled 2015-06-28 (×6): qty 2

## 2015-06-28 MED ORDER — FENTANYL CITRATE (PF) 250 MCG/5ML IJ SOLN
INTRAMUSCULAR | Status: AC
  Filled 2015-06-28: qty 5

## 2015-06-28 MED ORDER — ACETAMINOPHEN 650 MG RE SUPP: 650.0000 mg | Freq: Four times a day (QID) | RECTAL | Status: DC | PRN

## 2015-06-28 SURGICAL SUPPLY — 57 items
BLADE SURG ROTATE 9660 (MISCELLANEOUS) IMPLANT
CANISTER SUCTION 2500CC (MISCELLANEOUS) ×3 IMPLANT
CATH MALECOT BARD  24FR (CATHETERS) ×1
CATH MALECOT BARD 24FR (CATHETERS) ×2 IMPLANT
CHLORAPREP W/TINT 26ML (MISCELLANEOUS) IMPLANT
COVER MAYO STAND STRL (DRAPES) IMPLANT
COVER SURGICAL LIGHT HANDLE (MISCELLANEOUS) ×3 IMPLANT
DRAPE LAPAROSCOPIC ABDOMINAL (DRAPES) ×3 IMPLANT
DRAPE PROXIMA HALF (DRAPES) IMPLANT
DRAPE UTILITY XL STRL (DRAPES) ×6 IMPLANT
DRAPE WARM FLUID 44X44 (DRAPE) ×3 IMPLANT
DRSG OPSITE POSTOP 4X10 (GAUZE/BANDAGES/DRESSINGS) IMPLANT
DRSG OPSITE POSTOP 4X6 (GAUZE/BANDAGES/DRESSINGS) ×3 IMPLANT
DRSG OPSITE POSTOP 4X8 (GAUZE/BANDAGES/DRESSINGS) ×3 IMPLANT
ELECT BLADE 6.5 EXT (BLADE) IMPLANT
ELECT CAUTERY BLADE 6.4 (BLADE) ×3 IMPLANT
ELECT REM PT RETURN 9FT ADLT (ELECTROSURGICAL) ×3
ELECTRODE REM PT RTRN 9FT ADLT (ELECTROSURGICAL) ×2 IMPLANT
GAUZE SPONGE 4X4 12PLY STRL (GAUZE/BANDAGES/DRESSINGS) ×3 IMPLANT
GLOVE BIO SURGEON STRL SZ8 (GLOVE) ×6 IMPLANT
GLOVE BIOGEL PI IND STRL 8 (GLOVE) ×2 IMPLANT
GLOVE BIOGEL PI INDICATOR 8 (GLOVE) ×1
GOWN STRL REUS W/ TWL LRG LVL3 (GOWN DISPOSABLE) ×4 IMPLANT
GOWN STRL REUS W/ TWL XL LVL3 (GOWN DISPOSABLE) ×2 IMPLANT
GOWN STRL REUS W/TWL LRG LVL3 (GOWN DISPOSABLE) ×2
GOWN STRL REUS W/TWL XL LVL3 (GOWN DISPOSABLE) ×1
KIT BASIN OR (CUSTOM PROCEDURE TRAY) ×3 IMPLANT
KIT ROOM TURNOVER OR (KITS) ×3 IMPLANT
LIGASURE IMPACT 36 18CM CVD LR (INSTRUMENTS) IMPLANT
NEEDLE 22X1 1/2 (OR ONLY) (NEEDLE) IMPLANT
NS IRRIG 1000ML POUR BTL (IV SOLUTION) ×9 IMPLANT
PACK GENERAL/GYN (CUSTOM PROCEDURE TRAY) ×3 IMPLANT
PAD ARMBOARD 7.5X6 YLW CONV (MISCELLANEOUS) ×6 IMPLANT
PENCIL BUTTON HOLSTER BLD 10FT (ELECTRODE) ×3 IMPLANT
PLUG CATH AND CAP STER (CATHETERS) IMPLANT
SPECIMEN JAR LARGE (MISCELLANEOUS) IMPLANT
SPONGE DRAIN TRACH 4X4 STRL 2S (GAUZE/BANDAGES/DRESSINGS) ×6 IMPLANT
SPONGE LAP 18X18 X RAY DECT (DISPOSABLE) IMPLANT
STAPLER VISISTAT 35W (STAPLE) ×3 IMPLANT
SUCTION POOLE TIP (SUCTIONS) ×3 IMPLANT
SUT PDS AB 1 TP1 96 (SUTURE) ×6 IMPLANT
SUT SILK 2 0 (SUTURE) ×1
SUT SILK 2 0 SH (SUTURE) ×6 IMPLANT
SUT SILK 2 0 SH CR/8 (SUTURE) ×3 IMPLANT
SUT SILK 2 0 TIES 10X30 (SUTURE) ×6 IMPLANT
SUT SILK 2-0 18XBRD TIE 12 (SUTURE) ×2 IMPLANT
SUT SILK 3 0 (SUTURE)
SUT SILK 3 0 SH CR/8 (SUTURE) ×3 IMPLANT
SUT SILK 3-0 18XBRD TIE 12 (SUTURE) IMPLANT
SYR CONTROL 10ML LL (SYRINGE) IMPLANT
SYRINGE TOOMEY DISP (SYRINGE) ×3 IMPLANT
TOWEL OR 17X24 6PK STRL BLUE (TOWEL DISPOSABLE) ×3 IMPLANT
TOWEL OR 17X26 10 PK STRL BLUE (TOWEL DISPOSABLE) ×3 IMPLANT
TRAY FOLEY CATH 16FRSI W/METER (SET/KITS/TRAYS/PACK) IMPLANT
TUBE CONNECTING 12X1/4 (SUCTIONS) IMPLANT
WATER STERILE IRR 1000ML POUR (IV SOLUTION) ×3 IMPLANT
YANKAUER SUCT BULB TIP NO VENT (SUCTIONS) IMPLANT

## 2015-06-28 NOTE — Transfer of Care (Signed)
Immediate Anesthesia Transfer of Care Note  Patient: Mike Rowe  Procedure(s) Performed: Procedure(s): EXPLORATORY LAPAROTOMY (N/A) PERCUTANEOUS ENDOSCOPIC GASTROSTOMY (PEG) PLACEMENT (N/A)  Patient Location: PACU  Anesthesia Type:General  Level of Consciousness: awake  Airway & Oxygen Therapy: Patient Spontanous Breathing and Patient connected to nasal cannula oxygen  Post-op Assessment: Report given to RN, Post -op Vital signs reviewed and stable and Patient moving all extremities X 4  Post vital signs: Reviewed and stable  Last Vitals:  Filed Vitals:   06/28/15 1200  BP: 168/103  Pulse: 84  Resp: 15    Complications: No apparent anesthesia complications

## 2015-06-28 NOTE — Anesthesia Preprocedure Evaluation (Addendum)
Anesthesia Evaluation  Patient identified by MRN, date of birth, ID band Patient unresponsive    Reviewed: Allergy & Precautions, NPO status , Patient's Chart, lab work & pertinent test results, Unable to perform ROS - Chart review only  Airway Mallampati: II  TM Distance: >3 FB Neck ROM: Full    Dental no notable dental hx.    Pulmonary Current Smoker,  Acute respiratory failure 05/2015 breath sounds clear to auscultation  Pulmonary exam normal       Cardiovascular hypertension, On Medications Normal cardiovascular examRhythm:Regular Rate:Normal     Neuro/Psych PSYCHIATRIC DISORDERS CVA, Residual Symptoms negative psych ROS   GI/Hepatic negative GI ROS, (+)     substance abuse  alcohol use and cocaine use,   Endo/Other  negative endocrine ROS  Renal/GU negative Renal ROS  negative genitourinary   Musculoskeletal negative musculoskeletal ROS (+)   Abdominal   Peds negative pediatric ROS (+)  Hematology negative hematology ROS (+)   Anesthesia Other Findings See surgeons admission note.  Reproductive/Obstetrics negative OB ROS                            Anesthesia Physical Anesthesia Plan  ASA: III and emergent  Anesthesia Plan: General   Post-op Pain Management:    Induction: Intravenous  Airway Management Planned: Oral ETT  Additional Equipment:   Intra-op Plan:   Post-operative Plan: Possible Post-op intubation/ventilation  Informed Consent: I have reviewed the patients History and Physical, chart, labs and discussed the procedure including the risks, benefits and alternatives for the proposed anesthesia with the patient or authorized representative who has indicated his/her understanding and acceptance.   Dental advisory given and History available from chart only  Plan Discussed with: CRNA, Surgeon and Anesthesiologist  Anesthesia Plan Comments:        Anesthesia  Quick Evaluation

## 2015-06-28 NOTE — Anesthesia Procedure Notes (Signed)
Procedure Name: Intubation Date/Time: 06/28/2015 1:06 PM Performed by: Neldon Newport Pre-anesthesia Checklist: Patient being monitored, Suction available, Emergency Drugs available, Patient identified and Timeout performed Patient Re-evaluated:Patient Re-evaluated prior to inductionOxygen Delivery Method: Circle system utilized Preoxygenation: Pre-oxygenation with 100% oxygen Intubation Type: IV induction Ventilation: Mask ventilation without difficulty Laryngoscope Size: Mac and 3 Grade View: Grade II Tube type: Oral Tube size: 7.5 mm Number of attempts: 1 Placement Confirmation: positive ETCO2,  ETT inserted through vocal cords under direct vision and breath sounds checked- equal and bilateral Secured at: 23 cm Tube secured with: Tape Dental Injury: Teeth and Oropharynx as per pre-operative assessment

## 2015-06-28 NOTE — ED Notes (Signed)
Surgery at bedside.

## 2015-06-28 NOTE — Anesthesia Postprocedure Evaluation (Signed)
  Anesthesia Post-op Note  Patient: Mike Rowe  Procedure(s) Performed: Procedure(s) (LRB): EXPLORATORY LAPAROTOMY (N/A) PERCUTANEOUS ENDOSCOPIC GASTROSTOMY (PEG) PLACEMENT (N/A)  Patient Location: PACU  Anesthesia Type: General  Level of Consciousness: awake and alert   Airway and Oxygen Therapy: Patient Spontanous Breathing  Post-op Pain: mild  Post-op Assessment: Post-op Vital signs reviewed, Patient's Cardiovascular Status Stable, Respiratory Function Stable, Patent Airway and No signs of Nausea or vomiting  Last Vitals:  Filed Vitals:   06/28/15 1422  BP: 209/112  Pulse: 87  Temp: 36.6 C  Resp: 19    Post-op Vital Signs: stable   Complications: No apparent anesthesia complications

## 2015-06-28 NOTE — Op Note (Signed)
06/28/2015  2:07 PM  PATIENT:  Mike Rowe  59 y.o. male  PRE-OPERATIVE DIAGNOSIS: dislodged PEG tube  POST-OPERATIVE DIAGNOSIS:  Dislodged PEG tube  PROCEDURE:  Procedure(s): EXPLORATORY LAPAROTOMY GASTROSTOMY TUBE PLACEMENT 24 MALECOT  SURGEON:  Surgeon(s): Georganna Skeans, MD  ASSISTANTS: none   ANESTHESIA:   general  EBL:  Total I/O In: -  Out: 200 [Urine:150; Blood:50]  BLOOD ADMINISTERED:none  DRAINS: Gastrostomy Tube   SPECIMEN:  No Specimen  DISPOSITION OF SPECIMEN:  N/A  COUNTS:  YES  DICTATION: .Dragon Dictation [name] underwent PEG tube placement on 06/25/2015. He was discharged to [facility] rehabilitation on 06/26/2015. Within 36 hours his PEG tube was pulled out and he was sent to  [facility]. They tried to place a Foley catheter along the tract but tube study demonstrated this was intraperitoneal. He was transferred to [facility] for further care. He is brought for laparotomy and placement of gastrostomy tube. Emergency consent was obtained as he has no family and is status post CVA. He received intravenous antibiotics. He has not the operating room. Gen. anesthesia was administered by the anesthesia staff. Foley catheter was placed by nursing. Abdomen was prepped and draped in sterile fashion. We did a time out procedure. Upper midline incision was made. Subcutaneous tissues were dissected down revealing the anterior fascia. This was divided sharply along the midline. Peritoneal cavity was entered under direct vision. Fascia was opened to the length of the incision. Falciform ligament was divided between Howell clamps and tied. The peritoneal fluid was suctioned out. This appeared to be a contrast that they had used and not a lot of contamination. The abdomen was irrigated with several liters of warm saline. The stomach was inspected and this PEG site was identified. 2 concentric 2-0 silk sutures were placed around that site. A 24 French Malecot was passed through the  previous abdominal wall incision. This was inserted via his existing gastrotomy and the 2 pursestrings were sequentially tied. The sutures were then used to tack up the stomach to the anterior abdominal wall after reducing the slack in the tube. Multiple other 2-0 silk were used to tack the stomach to the anterior abdominal wall. The G-tube was secured to the skin with 2 silk sutures. Abdomen was again irrigated. Irrigation fluid was evacuated. Hemostasis was ensured. Fascia was closed with 2 lengths of #1 looped PDS and the skin was closed with staples. All counts were correct. G-tube was placed to gravity. He tolerated the procedure without apparent complications taken recovery in stable condition. PATIENT DISPOSITION:  PACU - hemodynamically stable.   Delay start of Pharmacological VTE agent (>24hrs) due to surgical blood loss or risk of bleeding:  no  Georganna Skeans, MD, MPH, FACS Pager: [phone]  8/14/20162:07 PM

## 2015-06-28 NOTE — ED Notes (Signed)
Pt. Presents as tx from [facility] [place]. Pt. With hx of peg tube, found to be displaced. Sent here for surgery consult. Pt. At baseline mental status.

## 2015-06-28 NOTE — Consult Note (Signed)
Reason for Consult:Dislodged PEG Referring Physician: Deklan Rowe is an 59 y.o. male.  HPI: Mike Rowe underwent PEG tube placement on 06/24/2013. He was discharged to [facility] rehabilitation on 06/25/2013. His PEG tube became dislodged and he was sent to [facility] emergency department. The ED physician there placed a Foley through the site but x-ray confirmed this was not in the stomach. He was transferred via [place]. He does not remember what happened to his PEG tube and he does complain of some abdominal pain.  No past medical history on file.  Past Surgical History  Procedure Laterality Date  . Peg placement N/A 06/25/2015    Procedure: PERCUTANEOUS ENDOSCOPIC GASTROSTOMY (PEG) PLACEMENT;  Surgeon: Mike Skeans, MD;  Location: [place];  Service: General;  Laterality: N/A;    No family history on file.  Social History:  reports that he has never smoked. He does not have any smokeless tobacco history on file. His alcohol and drug histories are not on file.  Allergies: No Known Allergies  Medications: Prior to Admission:  (Not in a hospital admission)  Results for orders placed or performed during the hospital encounter of 06/05/15 (from the past 48 hour(s))  Glucose, capillary     Status: Abnormal   Collection Time: 06/26/15 12:04 PM  Result Value Ref Range   Glucose-Capillary 121 (H) 65 - 99 mg/dL   Comment 1 Document in Chart   Glucose, capillary     Status: Abnormal   Collection Time: 06/26/15  4:47 PM  Result Value Ref Range   Glucose-Capillary 101 (H) 65 - 99 mg/dL   Comment 1 Document in Chart   Platelet count     Status: Abnormal   Collection Time: 06/27/15  6:11 AM  Result Value Ref Range   Platelets 83 (L) 150 - 400 K/uL    Comment: CONSISTENT WITH PREVIOUS RESULT  Glucose, capillary     Status: Abnormal   Collection Time: 06/27/15  6:29 AM  Result Value Ref Range   Glucose-Capillary 122 (H) 65 - 99 mg/dL   Comment 1 Notify RN    Comment 2 Document  in Chart     No results found.  Review of Systems  Unable to perform ROS: mental status change   Blood pressure 156/94, pulse 83, resp. rate 12, SpO2 98 %. Physical Exam  Constitutional: He appears well-developed. No distress.  HENT:  Head: Normocephalic and atraumatic.  Right Ear: External ear normal.  Left Ear: External ear normal.  Nose: Nose normal.  Mouth/Throat: Oropharynx is clear and moist.  Neck: Neck supple.  Cardiovascular: Normal rate and normal heart sounds.   Respiratory: Effort normal and breath sounds normal. No respiratory distress. He has no wheezes.  GI: Soft. He exhibits no distension. There is tenderness. There is no rebound and no guarding.  PEG site left upper quadrant without abnormality, some generalized tenderness without guarding  Neurological:  Left hemiparesis  Skin: Skin is warm.    Assessment/Plan: PEG tube dislodged - we'll give IV Zosyn and take him emergently to the operating room for exploratory laparotomy and gastrostomy tube placement. He is status post recent CVA and cannot sign consents for himself. He has no family. I have written an emergency consent and I did discuss this with him.  Recent CVA and other medical problems - appreciate medical admission  Mike Rowe E 06/28/2015, 10:16 AM

## 2015-06-28 NOTE — H&P (Signed)
History and Physical  Ayyan Sites ZOX:[id number] DOB: 1956/02/14 DOA: 06/28/2015   PCP: No primary care provider on file.  Referring Physician: ED/ Dr. Pattricia Boss  Chief Complaint: dislodge PEG  HPI:  59 year old male with no previous medical history except for recent intracerebral hemorrhage for which she was recently discharged from [facility] on 06/26/2015. He was discharged to [facility]. During his last hospitalization, the patient was seen by neurology for his right thalamic hemorrhage which was thought to be secondary to uncontrolled hypertension in the setting of cocaine use. His hospitalization was also complicated by aspiration pneumonitis and uncontrolled blood pressure. Gastrostomy tube was placed by Dr. Georganna Skeans on 06/26/15. The patient was on Jevity at 55 mL per hour. Unfortunately, the patient had dislodgment of his gastrostomy tube. He was sent to [facility] emergency department.   They attempted to reinsert gastrostomy tube. Gastrografin study post insertion revealed extravasation of the contrast around the spleen. Dr. Grandville Silos was consulted and requested transfer to [facility].  He was subsequently transferred to [facility] for definitive care by Dr. Grandville Silos. In the emergency department, the patient was afebrile and hemodynamically stable with oxygen saturation 98-99 percent on room air. BMP and CBC were unremarkable except for platelets of 65,000 which has been chronic. Sodium was 134. AST 165, ALT 264, phosphatase 87, total bilirubin 0.7.  TRH was asked to admit due to pt's medical complexity.  Assessment/Plan: Dislodged gastrostomy tube -Gen. surgery has been consulted and plans to take the patient for laparotomy and placement of new gastrostomy tube on 06/28/2015 History of respiratory distress/acute respiratory failure--Aspiration pneumonitis -Patient finished 1 week of Unasyn during his last hospitalization -Restart Unasyn for presumptive peritonitis Right thalamic  hemorrhage -Secondary to uncontrolled hypertension and in setting of Cocaine use -LDL 97 -Hemoglobin A1c 5.2 -Patient has residual left hemiplegia with left-sided neglect -He intermittenly verbalizes yes/not and intermittenly follows one step commands Dysphagia -reinsertion of PEG by CCS on 06/28/15 Encephalopathy -Secondary to hemorrhagic stroke -More alert since correction of his electrolytes Hypertension -Restart per PEG clonidine, labetalol, hydralazine, and amlodipine when new PEG is cleared for use -IV metoprolol and hydralazine for now Polysubstance abuse -Including cocaine, alcohol, tobacco Transaminasemia -Likely secondary to chronic hepatitis C--positive qualitative RNA Hyperlipidemia -Hold statin at this time in the setting of elevated LFTs Elevated troponin--last hospitalization -Secondary to demand ischemia -Finish cycling troponins -Doubt ACS -EKG with ST depression insistent with LVH with repolarization abnormalities         PMHx--hypertension, intracerebral hemorrhage Past Surgical History  Procedure Laterality Date  . Peg placement N/A 06/25/2015    Procedure: PERCUTANEOUS ENDOSCOPIC GASTROSTOMY (PEG) PLACEMENT;  Surgeon: Georganna Skeans, MD;  Location: [facility] ENDOSCOPY;  Service: General;  Laterality: N/A;   Social History: History of tobacco, alcohol, and cocaine use. Otherwise unobtainable secondary to patient's mental status  Family History--unobtainable secondary to the patient's mental status  No Known Allergies    Prior to Admission medications   Medication Sig Start Date End Date Taking? Authorizing Provider  acetaminophen (TYLENOL) 160 MG/5ML solution Place 20.3 mLs (650 mg total) into feeding tube every 4 (four) hours as needed for fever (temp >99). 06/25/15   Garvin Fila, MD  Amino Acids-Protein Hydrolys (FEEDING SUPPLEMENT, PRO-STAT SUGAR FREE 64,) LIQD Place 30 mLs into feeding tube daily. 06/25/15   Garvin Fila, MD  amLODipine (NORVASC) 10  MG tablet Take 1 tablet (10 mg total) by mouth daily. 06/25/15   Garvin Fila, MD  antiseptic oral  rinse (CPC / CETYLPYRIDINIUM CHLORIDE 0.05%) 0.05 % LIQD solution 7 mLs by Mouth Rinse route 2 times daily at 12 noon and 4 pm. 06/25/15   Garvin Fila, MD  bisacodyl (DULCOLAX) 10 MG suppository Place 1 suppository (10 mg total) rectally daily as needed for moderate constipation. 06/25/15   Garvin Fila, MD  cloNIDine (CATAPRES) 0.2 MG tablet Take 1 tablet (0.2 mg total) by mouth 3 (three) times daily. 06/25/15   Garvin Fila, MD  folic acid (FOLVITE) 1 MG tablet Take 1 tablet (1 mg total) by mouth daily. 06/25/15   Garvin Fila, MD  heparin 5000 UNIT/ML injection Inject 1 mL (5,000 Units total) into the skin every 8 (eight) hours. 06/25/15   Garvin Fila, MD  hydrALAZINE (APRESOLINE) 100 MG tablet Place 1 tablet (100 mg total) into feeding tube every 8 (eight) hours. 06/25/15   Garvin Fila, MD  insulin aspart (NOVOLOG) 100 UNIT/ML injection Inject 2-6 Units into the skin every 4 (four) hours. 06/25/15   Garvin Fila, MD  labetalol (NORMODYNE) 300 MG tablet Take 1 tablet (300 mg total) by mouth 3 (three) times daily. 06/25/15   Garvin Fila, MD  Multiple Vitamin (MULTIVITAMIN) LIQD Place 5 mLs into feeding tube daily. 06/25/15   Garvin Fila, MD  Nutritional Supplements (FEEDING SUPPLEMENT, JEVITY 1.5 CAL/FIBER,) LIQD Place 1,000 mLs into feeding tube continuous. 06/25/15   Garvin Fila, MD  thiamine 100 MG tablet Take 1 tablet (100 mg total) by mouth daily. 06/25/15   Garvin Fila, MD  Water For Irrigation, Sterile (FREE WATER) SOLN Place 300 mLs into feeding tube 4 (four) times daily. 06/25/15   Garvin Fila, MD    Review of Systems:  Obtainable secondary to patient's mental status  Physical Exam: Filed Vitals:   06/28/15 0945 06/28/15 1000 06/28/15 1015  BP: 156/94 175/94 168/102  Pulse: 83 79 74  Resp: 12 17 14   SpO2: 98% 96% 97%   General:  Awake and alert NAD, nontoxic,  follows one-step commands Head/Eye: No conjunctival hemorrhage, no icterus, [place]/AT, No nystagmus ENT:  No icterus,  No thrush, poor dentition, no pharyngeal exudate Neck:  No masses, no lymphadenpathy, no bruits; no meningismus CV:  RRR, no rub, no gallop, no S3 Lung:  CTAB, good air movement, no wheeze, no rhonchi Abdomen: soft/NT, +BS, nondistended, no peritoneal signs Ext: No cyanosis, No rashes, No petechiae, No lymphangitis, No edema   Labs on Admission:  Basic Metabolic Panel:  Recent Labs Lab 06/22/15 0759 06/23/15 1006 06/24/15 0550 06/26/15 0646  NA 141 139 139 135  K 3.6 3.6 3.6 3.4*  CL 109 109 109 106  CO2 23 23 23  20*  GLUCOSE 176* 129* 104* 105*  BUN 24* 24* 22* 17  CREATININE 0.76 0.85 0.73 0.64  CALCIUM 8.1* 8.3* 8.0* 7.9*   Liver Function Tests:  Recent Labs Lab 06/26/15 0646  AST 143*  ALT 204*  ALKPHOS 66  BILITOT 0.8  PROT 5.6*  ALBUMIN 2.4*   No results for input(s): LIPASE, AMYLASE in the last 168 hours. No results for input(s): AMMONIA in the last 168 hours. CBC:  Recent Labs Lab 06/26/15 0646 06/27/15 0611  WBC 6.7  --   NEUTROABS 4.9  --   HGB 12.1*  --   HCT 35.7*  --   MCV 92.5  --   PLT 70* 83*   Cardiac Enzymes: No results for input(s): CKTOTAL, CKMB, CKMBINDEX, TROPONINI in the last  168 hours. BNP: Invalid input(s): POCBNP CBG:  Recent Labs Lab 06/26/15 0404 06/26/15 0801 06/26/15 1204 06/26/15 1647 06/27/15 0629  GLUCAP 92 117* 121* 101* 122*    Radiological Exams on Admission: No results found.      Time spent:60 minutes Code Status:   FULL Family Communication:  No Family at bedside   Kirstie Larsen, DO  [facility] Pager [phone]  If 7PM-7AM, please contact night-coverage [email or web address] Password [facility] 06/28/2015, 10:29 AM

## 2015-06-28 NOTE — ED Provider Notes (Signed)
Mike Rowe is a 59 year old male who had an intracerebral hemorrhage secondary to hypertension and cocaine use July 22. He was seen and evaluated here as an inpatient. During that hospitalization he had a PEG tube placed by Dr. Grandville Silos. Patient was discharged to a skilled nursing facility 2 days ago. He presented to the emergency department at [facility] last night due to peg tube dislodgment. Dr. Gilford Raid , ED doctor at [facility], called for patient transfer after attempting to replace PEG tube. Gastrografin study  there showed that the gastric graph and is accumulating around the spleen consistent with extraluminal position of distal tip of gastrostomy tube. She spoke with Dr. Grandville Silos who accepted the patient in transfer. Patient was, subsequently, transferred here to the emergency department. On arrival he is hemodynamically stable with heart rate of 78, blood pressure remains elevated at 167/105, oxygen saturations 97%.  Chronically ill-appearing male with right-sided gaze. He answers simple questions.  Abdomen decreased bowel sounds site consistent with PEG tube placement left upper quadrant, abdomen is soft he does not respond to palpation and it does not appear to be tender.  Labs sent with patient obtained today show white blood cell count of 6900, hemoglobin 14, INR 1.1, blood sugar 125, and elevated transaminases. Gen. surgery has been paged.  Discussed with Claiborne Billings, on call for general surgery, they will see ined but advise a general med consult for admission- repeat cbc ordered for platelet count and zosyn iv ordered.   Pattricia Boss, MD 06/28/15 1002

## 2015-06-29 ENCOUNTER — Encounter (HOSPITAL_COMMUNITY): Payer: Self-pay | Admitting: General Surgery

## 2015-06-29 DIAGNOSIS — R748 Abnormal levels of other serum enzymes: Secondary | ICD-10-CM

## 2015-06-29 DIAGNOSIS — E43 Unspecified severe protein-calorie malnutrition: Secondary | ICD-10-CM

## 2015-06-29 LAB — COMPREHENSIVE METABOLIC PANEL
ALBUMIN: 3.1 g/dL — AB (ref 3.5–5.0)
ALT: 174 U/L — AB (ref 17–63)
AST: 96 U/L — AB (ref 15–41)
Alkaline Phosphatase: 75 U/L (ref 38–126)
Anion gap: 10 (ref 5–15)
BILIRUBIN TOTAL: 1 mg/dL (ref 0.3–1.2)
BUN: 12 mg/dL (ref 6–20)
CHLORIDE: 103 mmol/L (ref 101–111)
CO2: 22 mmol/L (ref 22–32)
CREATININE: 0.67 mg/dL (ref 0.61–1.24)
Calcium: 8.5 mg/dL — ABNORMAL LOW (ref 8.9–10.3)
GFR calc Af Amer: >60 mL/min (ref >60)
GLUCOSE: 94 mg/dL (ref 65–99)
Potassium: 3.7 mmol/L (ref 3.5–5.1)
Sodium: 135 mmol/L (ref 135–145)
Total Protein: 6.5 g/dL (ref 6.5–8.1)

## 2015-06-29 LAB — CBC
HEMATOCRIT: 38.6 % — AB (ref 39.0–52.0)
Hemoglobin: 13.4 g/dL (ref 13.0–17.0)
MCH: 32.1 pg (ref 26.0–34.0)
MCHC: 34.7 g/dL (ref 30.0–36.0)
MCV: 92.3 fL (ref 78.0–100.0)
PLATELETS: 86 10*3/uL — AB (ref 150–400)
RBC: 4.18 MIL/uL — ABNORMAL LOW (ref 4.22–5.81)
RDW: 14.1 % (ref 11.5–15.5)
WBC: 11.1 10*3/uL — AB (ref 4.0–10.5)

## 2015-06-29 LAB — GLUCOSE, CAPILLARY
GLUCOSE-CAPILLARY: 122 mg/dL — AB (ref 65–99)
GLUCOSE-CAPILLARY: 101 mg/dL — AB (ref 65–99)
Glucose-Capillary: 104 mg/dL — ABNORMAL HIGH (ref 65–99)

## 2015-06-29 LAB — MRSA PCR SCREENING: MRSA by PCR: NEGATIVE

## 2015-06-29 MED ORDER — JEVITY 1.2 CAL PO LIQD
1000.0000 mL | ORAL | Status: DC
  Filled 2015-06-29 (×2): qty 1000

## 2015-06-29 MED ORDER — JEVITY 1.5 CAL/FIBER PO LIQD
1000.0000 mL | ORAL | Status: DC
  Administered 2015-06-29 – 2015-07-01 (×3): 1000 mL
  Filled 2015-06-29 (×11): qty 1000

## 2015-06-29 MED ORDER — CHLORHEXIDINE GLUCONATE 0.12 % MT SOLN
15.0000 mL | Freq: Two times a day (BID) | OROMUCOSAL | Status: DC
  Administered 2015-06-29 – 2015-07-03 (×9): 15 mL via OROMUCOSAL
  Filled 2015-06-29 (×9): qty 15

## 2015-06-29 MED ORDER — DEXTROSE-NACL 5-0.45 % IV SOLN
INTRAVENOUS | Status: DC
Start: 2015-06-29 — End: 2015-06-30
  Administered 2015-06-29: 02:00:00 via INTRAVENOUS

## 2015-06-29 MED ORDER — CETYLPYRIDINIUM CHLORIDE 0.05 % MT LIQD
7.0000 mL | Freq: Two times a day (BID) | OROMUCOSAL | Status: DC
  Administered 2015-06-29 – 2015-07-03 (×8): 7 mL via OROMUCOSAL

## 2015-06-29 NOTE — Progress Notes (Addendum)
[facility] PROGRESS NOTE  Mike Rowe JJK:[id number] DOB: Feb 13, 1956 DOA: 06/28/2015 PCP: No primary care provider on file.  Assessment/Plan: Dislodged gastrostomy tube -Gen. 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 -He intermittenly verbalizes yes/no and intermittenly follows one step commands -will need SNF at discharge  Dysphagia -reinsertion of PEG by CCS on 06/28/15 -plan is to start trickle feeding today -will follow CCS rec's on when tube is ready to use  Encephalopathy -Secondary to hemorrhagic stroke -close to baseline now  Hypertension -Plan is to Restart per PEG clonidine, labetalol, hydralazine, and amlodipine when new PEG is cleared for use -continue IV metoprolol and hydralazine for now  Polysubstance abuse -Including cocaine, alcohol, tobacco -patient has stopped these habits since last admisison  Transaminatis  -Likely secondary to chronic hepatitis C--positive qualitative RNA -worsen with ongoing stress demargination and dehydration -will monitor -holding statins   Severe protein malnutrition -continue TF when PEG is able to be use  Hyperlipidemia -Hold statin at this time in the setting of elevated LFTs -will follow LFT's and decide on best timing to resume statins if possible   Code Status: Full Family Communication: no family at bedside  Disposition Plan: back to SNF once medically stable    Consultants:  CCS   Procedures:  Exp lap and G-tube placement :  06/28/15  Antibiotics:  Unasyn  8/14  HPI/Subjective: Afebrile, no acute distress. Some bleeding reported out of his abd wound earlier today, appears to be stable now.  Objective: Filed Vitals:   06/29/15 0557  BP: 163/109  Pulse: 73  Temp: 98.6 F (37 C)  Resp: 14    Intake/Output Summary (Last 24 hours) at 06/29/15 1302 Last data filed at 06/29/15 0700  Gross per 24 hour  Intake    275 ml  Output   1700 ml  Net  -1425 ml   Filed Weights   06/28/15 1606  Weight: 70.3 kg (154 lb 15.7 oz)    Exam:   General:  able to follow simple commands and knot with his head; no fever and in no distress. Slight wound bleeding earlier; appears to be stable now.  Cardiovascular: S1 and S2, no rubs or gallops  Respiratory: CTA bilaterally  Abdomen: with dressing over wound in mid abd; G-tube in place; positive BS  Musculoskeletal: no edema, no cyanosis, no clubbing   Data Reviewed: Basic Metabolic Panel:  Recent Labs Lab 06/23/15 1006 06/24/15 0550 06/26/15 0646 06/29/15 0315  NA 139 139 135 135  K 3.6 3.6 3.4* 3.7  CL 109 109 106 103  CO2 23 23 20* 22  GLUCOSE 129* 104* 105* 94  BUN 24* 22* 17 12  CREATININE 0.85 0.73 0.64 0.67  CALCIUM 8.3* 8.0* 7.9* 8.5*   Liver Function Tests:  Recent Labs Lab 06/26/15 0646 06/29/15 0315  AST 143* 96*  ALT 204* 174*  ALKPHOS 66 75  BILITOT 0.8 1.0  PROT 5.6* 6.5  ALBUMIN 2.4* 3.1*   CBC:  Recent Labs Lab 06/26/15 0646 06/27/15 0611 06/28/15 1002 06/29/15 0315  WBC 6.7  --  9.5 11.1*  NEUTROABS 4.9  --  7.9*  --   HGB 12.1*  --  14.1 13.4  HCT 35.7*  --  39.9 38.6*  MCV 92.5  --  91.5 92.3  PLT 70* 83* 82* 86*   BNP (last 3 results)  Recent Labs  06/19/15 1415  BNP 349.3*   CBG:  Recent Labs Lab 06/26/15 0801 06/26/15 1204 06/26/15 1647 06/27/15 0629 06/29/15 1122  GLUCAP 117* 121* 101* 122* 104*    Recent Results (from the past 240 hour(s))  MRSA PCR Screening     Status: None    Collection Time: 06/29/15  6:02 AM  Result Value Ref Range Status   MRSA by PCR NEGATIVE NEGATIVE Final    Comment:        The GeneXpert MRSA Assay (FDA approved for NASAL specimens only), is one component of a comprehensive MRSA colonization surveillance program. It is not intended to diagnose MRSA infection nor to guide or monitor treatment for MRSA infections.      Studies: No results found.  Scheduled Meds: . ampicillin-sulbactam (UNASYN) IV  3 g Intravenous Q6H  . antiseptic oral rinse  7 mL Mouth Rinse q12n4p  . chlorhexidine  15 mL Mouth Rinse BID  . hydrALAZINE  5 mg Intravenous Q8H  . metoprolol  5 mg Intravenous Q6H  . thiamine  100 mg Intravenous Daily   Continuous Infusions: . dextrose 5 % and 0.45% NaCl 75 mL/hr at 06/29/15 0220  . feeding supplement (JEVITY 1.5 CAL/FIBER)      Active Problems:   Stroke due to intracerebral hemorrhage   Cocaine abuse   HTN (hypertension), malignant   Elevated liver enzymes   Dysphagia   Peritonitis   PEG tube malfunction   Protein-calorie malnutrition, severe    Time spent: 30 minutes    Barton Dubois  [facility] Pager [phone]. If 7PM-7AM, please contact night-coverage at [email or web address], password [email or web address] 06/29/2015, 1:02 PM  LOS: 1 day

## 2015-06-29 NOTE — Progress Notes (Signed)
Last night patient pulled off distal end of honeycomb dsg.  Small amount of bleeding noted.  Honeycomb covered with ABD pads and tape. Mitten applied to patient's rt hand;left arm contracted.  This am honeycomb and ABD pads saturated with bloody drainage.  Old dressing removed. Incision cleansed with saline and 4x4's and ABD pad and abdominal binder applied.  Noted blood seeping from incision.  Staples intact.  Dr. Grandville Silos notified. He will come see patient.

## 2015-06-29 NOTE — Progress Notes (Signed)
Initial Nutrition Assessment  DOCUMENTATION CODES:   Severe malnutrition in context of chronic illness  INTERVENTION:   Initiate Jevity 1.5 @ 20 ml/hr via PEG and increase by 10 ml every 4 hours to goal rate of 65 ml/hr.   Tube feeding regimen provides 2340 kcal (100% of needs), 100 grams of protein, and 1186 ml of H2O.   NUTRITION DIAGNOSIS:   Malnutrition related to chronic illness as evidenced by severe depletion of body fat, severe depletion of muscle mass.  GOAL:   Patient will meet greater than or equal to 90% of their needs  MONITOR:   Labs, Weight trends, TF tolerance, Skin, I & O's  REASON FOR ASSESSMENT:   Consult Enteral/tube feeding initiation and management  ASSESSMENT:   59 year old male with no previous medical history except for recent intracerebral hemorrhage for which she was recently discharged from [facility] on 06/26/2015. He was discharged to [facility]. During his last hospitalization, the patient was seen by neurology for his right thalamic hemorrhage which was thought to be secondary to uncontrolled hypertension in the setting of cocaine use. His hospitalization was also complicated by aspiration pneumonitis and uncontrolled blood pressure. Gastrostomy tube was placed by Dr. Georganna Skeans on 06/26/15. The patient was on Jevity at 55 mL per hour. Unfortunately, the patient had dislodgment of his gastrostomy tube. He was sent to [facility] emergency department. They attempted to reinsert gastrostomy tube. Gastrografin study post insertion revealed extravasation of the contrast around the spleen. Dr. Grandville Silos was consulted and requested transfer to [facility]. He was subsequently transferred to [facility] for definitive care by Dr. Grandville Silos. In the emergency department, the patient was afebrile and hemodynamically stable with oxygen saturation 98-99 percent on room air. BMP and CBC were unremarkable except for platelets of 65,000 which has been chronic. Sodium was 134.  AST 165, ALT 264, phosphatase 87, total bilirubin 0.7. TRH was asked to admit due to pt's medical complexity.  Pt re-admitted from [facility] after being discharged from [facility] on 06/27/15 after prolonged hospitalization on [facility] due to dislodged PEG.   S/p Procedure(s) 06/28/15: EXPLORATORY LAPAROTOMY GASTROSTOMY TUBE PLACEMENT 24 MALECOT  Pt lying in bed; unable to provide hx. Nutrition-Focused physical exam completed. Findings are severe fat depletion, severe muscle depletion, and no edema.   Previous TF regimen was Jevity 1.5 @ 65 ml/hr via PEG, providing provides 2340 kcal (100% of needs), 100 grams of protein, and 1186 ml of H2O.   Labs reviewed.   Diet Order:  Diet NPO time specified  Skin:  Wound (see comment) (closed abdominal incision)  Last BM:  06/25/15  Height:   Ht Readings from Last 1 Encounters:  06/05/15 6' (1.829 m)    Weight:   Wt Readings from Last 1 Encounters:  06/28/15 154 lb 15.7 oz (70.3 kg)    Ideal Body Weight:  80.9 kg  BMI:  Body mass index is 21.01 kg/(m^2).  Estimated Nutritional Needs:   Kcal:  2100-2400  Protein:  95-110 grams  Fluid:  >2.1 L  EDUCATION NEEDS:   No education needs identified at this time  Jimya Ciani A. Jimmye Norman, RD, LDN, CDE Pager: [phone] After hours Pager: [phone]

## 2015-06-29 NOTE — Progress Notes (Signed)
Patient bleed through dressing, abdominal pad, and abdominal binder. Changed abdominal pads, notified MD on call of continued bleeding.

## 2015-06-29 NOTE — Progress Notes (Signed)
Patient ID: Mike Rowe, male   DOB: 1956/09/02, 59 y.o.   MRN: [id number]   LOS: 1 day   POD#1  Subjective: Minimally communicative. Denies significant pain. Mild nausea, no flatus.   Objective: Vital signs in last 24 hours: Temp:  [97.9 F (36.6 C)-98.6 F (37 C)] 98.6 F (37 C) (08/15 0557) Pulse Rate:  [71-95] 73 (08/15 0557) Resp:  [10-26] 14 (08/15 0557) BP: (139-209)/(81-112) 163/109 mmHg (08/15 0557) SpO2:  [90 %-100 %] 95 % (08/15 0557) Weight:  [70.3 kg (154 lb 15.7 oz)] 70.3 kg (154 lb 15.7 oz) (08/14 1606) Last BM Date: 06/25/15   Laboratory  CBC  Recent Labs  06/28/15 1002 06/29/15 0315  WBC 9.5 11.1*  HGB 14.1 13.4  HCT 39.9 38.6*  PLT 82* 86*   BMET  Recent Labs  06/29/15 0315  NA 135  K 3.7  CL 103  CO2 22  GLUCOSE 94  BUN 12  CREATININE 0.67  CALCIUM 8.5*    Physical Exam General appearance: no distress Resp: clear to auscultation bilaterally Cardio: regular rate and rhythm GI: Soft, +BS, incision intact but still bleeding from inferior portion. G-tube site WNL.   Assessment/Plan: S/p ex lap, open g-tube 2/2 premature PEG tube removal -- With good BS will start TF and see how things go. Check CBC in am with ongoing bleeding. Multiple medical problems -- per primary service    Lisette Abu, PA-C Pager: [phone] General Trauma PA Pager: [phone]  06/29/2015

## 2015-06-29 NOTE — Evaluation (Signed)
Physical Therapy Evaluation Patient Details Name: Mike Rowe MRN: [id number] DOB: August 08, 1956 Today's Date: 06/29/2015   History of Present Illness  Mike Rowe is a 60 y/o M w/ recent intracerebral hemorrhage 2/2 uncontrolled HTN in the setting of cocaine use and was d/c to [facility] on 06/26/15.  Gastrostomy tube was placed on 06/26/15 which unfortunately dislodged.  Pt now s/p ex lap and PEG placement (06/28/15).    Clinical Impression  Pt admitted with above diagnosis. Pt currently with functional limitations due to the deficits listed below (see PT Problem List). Mike Rowe was agitated this session which limited his ability to participate in therapeutic interventions.  Pt w/ strong pushers syndrome and requires total +2 assist for bed mobility.  Anticipate increased activity tolerance once pt becomes more calm. Pt will benefit from skilled PT to increase their independence and safety with mobility to allow discharge to the venue listed below.     Follow Up Recommendations SNF;Supervision/Assistance - 24 hour    Equipment Recommendations  None recommended by PT    Recommendations for Other Services       Precautions / Restrictions Precautions Precautions: Fall Precaution Comments: L hemi, strong L lateral lean, R mit Restrictions Weight Bearing Restrictions: No      Mobility  Bed Mobility Overal bed mobility: +2 for physical assistance Bed Mobility: Supine to Sit;Sit to Supine     Supine to sit: +2 for physical assistance;Total assist Sit to supine: Total assist;+2 for physical assistance   General bed mobility comments: Total assist w/ trunk posteriorly and B legs to sit upright. Pt grabbing onto bed rail w/ RUE and pushing posteriorly. Pt agitated and will not rest RUE on lap despite hand over hand.    Transfers                    Ambulation/Gait                Stairs            Wheelchair Mobility    Modified Rankin (Stroke Patients  Only) Modified Rankin (Stroke Patients Only) Pre-Morbid Rankin Score: Severe disability Modified Rankin: Severe disability     Balance Overall balance assessment: Needs assistance Sitting-balance support: Feet supported;Single extremity supported Sitting balance-Leahy Scale: Zero Sitting balance - Comments: pusher syndrome and requires total assist to maintain upright despite attempt for hand over hand positioning of BUE for suupport Postural control: Left lateral lean;Posterior lean                                   Pertinent Vitals/Pain Pain Assessment: No/denies pain (Pt shakes head no when asked if he has any pain) Faces Pain Scale: No hurt Pain Intervention(s): Monitored during session;Limited activity within patient's tolerance    Home Living Family/patient expects to be discharged to:: Skilled nursing facility Living Arrangements: Alone Available Help at Discharge: Other (Comment) (unsure as pt can't answer questions)             Additional Comments: Pt uanble to provide information as he is nonverbal.  Information gathered from chart review.    Prior Function Level of Independence: Needs assistance   Gait / Transfers Assistance Needed: Per chart review pt required total assist for bed mobility and transfers.     Comments: Pt from SNF and is planning to return to SNF     Hand Dominance  Extremity/Trunk Assessment     RUE Deficits / Details: strong pushers w/ RUE         Lower Extremity Assessment: RLE deficits/detail;LLE deficits/detail RLE Deficits / Details: + extensor tone.  Pt kicks his legs 2/2 agitation  LLE Deficits / Details: + extensor tone.  Pt kicks his legs 2/2 agitation      Communication   Communication: Receptive difficulties;Expressive difficulties  Cognition Arousal/Alertness: Lethargic Behavior During Therapy: Agitated Overall Cognitive Status: Difficult to assess Area of Impairment: Attention;Following  commands;Safety/judgement;Awareness;Problem solving Orientation Level: Disoriented to;Person;Place;Time;Situation Current Attention Level: Focused   Following Commands: Follows one step commands inconsistently;Follows one step commands with increased time Safety/Judgement: Decreased awareness of safety;Decreased awareness of deficits Awareness: Intellectual Problem Solving: Slow processing;Decreased initiation;Difficulty sequencing;Requires verbal cues;Requires tactile cues General Comments: Pt very agitated this session, unable to determine reason.  Pt pushing heavily to Lt and posteriorly w/ RUE.    General Comments General comments (skin integrity, edema, etc.): Pt limited by agitation this session and because of this was unable to participate in therapeutic exercises or transfers    Exercises        Assessment/Plan    PT Assessment Patient needs continued PT services  PT Diagnosis Hemiplegia non-dominant side   PT Problem List Decreased range of motion;Decreased activity tolerance;Decreased balance;Decreased mobility;Decreased coordination;Decreased cognition;Decreased knowledge of use of DME;Decreased safety awareness;Decreased knowledge of precautions;Impaired tone  PT Treatment Interventions DME instruction;Functional mobility training;Therapeutic activities;Therapeutic exercise;Balance training;Neuromuscular re-education;Cognitive remediation;Patient/family education   PT Goals (Current goals can be found in the Care Plan section) Acute Rehab PT Goals Patient Stated Goal: unable PT Goal Formulation: Patient unable to participate in goal setting Time For Goal Achievement: 07/13/15 Potential to Achieve Goals: Fair    Frequency Min 2X/week   Barriers to discharge Decreased caregiver support      Co-evaluation               End of Session   Activity Tolerance: Treatment limited secondary to agitation Patient left: with call bell/phone within reach;in bed;with bed  alarm set;with restraints reapplied;with SCD's reapplied (w/ mitts reapplied) Nurse Communication: Mobility status         Time: 7169-6789 PT Time Calculation (min) (ACUTE ONLY): 23 min   Charges:   PT Evaluation $Initial PT Evaluation Tier I: 1 Procedure PT Treatments $Therapeutic Activity: 8-22 mins   PT G Codes:       Joslyn Hy PT, DPT [phone] Pager: [phone] 06/29/2015, 12:17 PM

## 2015-06-30 DIAGNOSIS — I10 Essential (primary) hypertension: Secondary | ICD-10-CM

## 2015-06-30 LAB — GLUCOSE, CAPILLARY
GLUCOSE-CAPILLARY: 142 mg/dL — AB (ref 65–99)
GLUCOSE-CAPILLARY: 141 mg/dL — AB (ref 65–99)
Glucose-Capillary: 136 mg/dL — ABNORMAL HIGH (ref 65–99)
Glucose-Capillary: 152 mg/dL — ABNORMAL HIGH (ref 65–99)
Glucose-Capillary: 152 mg/dL — ABNORMAL HIGH (ref 65–99)
Glucose-Capillary: 169 mg/dL — ABNORMAL HIGH (ref 65–99)

## 2015-06-30 LAB — CBC
HCT: 38.7 % — ABNORMAL LOW (ref 39.0–52.0)
HEMOGLOBIN: 13.4 g/dL (ref 13.0–17.0)
MCH: 32.1 pg (ref 26.0–34.0)
MCHC: 34.6 g/dL (ref 30.0–36.0)
MCV: 92.6 fL (ref 78.0–100.0)
PLATELETS: 116 10*3/uL — AB (ref 150–400)
RBC: 4.18 MIL/uL — AB (ref 4.22–5.81)
RDW: 14.3 % (ref 11.5–15.5)
WBC: 14.5 10*3/uL — AB (ref 4.0–10.5)

## 2015-06-30 LAB — APTT: APTT: 39 s — AB (ref 24–37)

## 2015-06-30 LAB — PROTIME-INR
INR: 1.11 (ref 0.00–1.49)
PROTHROMBIN TIME: 14.5 s (ref 11.6–15.2)

## 2015-06-30 MED ORDER — VITAMIN B-1 100 MG PO TABS: 100.0000 mg | ORAL_TABLET | Freq: Every day | ORAL | Status: DC

## 2015-06-30 MED ORDER — AMLODIPINE BESYLATE 10 MG PO TABS
10.0000 mg | ORAL_TABLET | Freq: Every day | ORAL | Status: DC
  Administered 2015-06-30 – 2015-07-03 (×4): 10 mg via ORAL
  Filled 2015-06-30 (×4): qty 1

## 2015-06-30 MED ORDER — HYDRALAZINE HCL 50 MG PO TABS
100.0000 mg | ORAL_TABLET | Freq: Three times a day (TID) | ORAL | Status: DC
  Administered 2015-06-30 – 2015-07-03 (×9): 100 mg
  Filled 2015-06-30 (×9): qty 2

## 2015-06-30 MED ORDER — FREE WATER
200.0000 mL | Freq: Three times a day (TID) | Status: DC
  Administered 2015-06-30 – 2015-07-03 (×11): 200 mL

## 2015-06-30 MED ORDER — LABETALOL HCL 100 MG PO TABS
300.0000 mg | ORAL_TABLET | Freq: Three times a day (TID) | ORAL | Status: DC
  Administered 2015-06-30 – 2015-07-03 (×9): 300 mg via ORAL
  Filled 2015-06-30 (×9): qty 3

## 2015-06-30 MED ORDER — CLONIDINE HCL 0.2 MG PO TABS
0.2000 mg | ORAL_TABLET | Freq: Three times a day (TID) | ORAL | Status: DC
  Administered 2015-06-30 – 2015-07-03 (×9): 0.2 mg via ORAL
  Filled 2015-06-30 (×9): qty 1

## 2015-06-30 MED ORDER — HYDRALAZINE HCL 20 MG/ML IJ SOLN: 10.0000 mg | Freq: Three times a day (TID) | INTRAMUSCULAR | Status: DC | PRN

## 2015-06-30 NOTE — Progress Notes (Signed)
[facility] PROGRESS NOTE  Mike Rowe QQV:[id number] DOB: September 26, 1956 DOA: 06/28/2015 PCP: No primary care provider on file.  Assessment/Plan: Dislodged gastrostomy tube -Gen. 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 -He intermittenly verbalizes yes/no and intermittenly follows one step commands -will need SNF resumption at discharge  Dysphagia -reinsertion of PEG by CCS on 06/28/15 -will resume medications, nutrition and free water per tube  Encephalopathy -Secondary to hemorrhagic stroke -close to baseline  -will need SNF at discharge  Hypertension -uncontrolled and experiencing rebound after being off clonidine -now that PEG is ready for use will resume home antihypertensive regimen and adjust further as needed -continue IV hydralazine PRN  Polysubstance abuse -Including cocaine, alcohol, tobacco -patient has stopped these habits since last admisison  Transaminatis  -Likely secondary to chronic hepatitis C (positive qualitative RNA) -worsen with ongoing stress demargination and dehydration -will monitor -holding statins for now -will need GI or  ID outpatient follow up for final decision on treatment of Hep C  Severe protein malnutrition -continue TF through PEG  Hyperlipidemia -Hold statin at this time in the setting of elevated LFTs -will follow LFT's and decide on best timing to resume statins if possible   Leukocytosis -continue current antibiotics therapy  Code Status:  Full Family Communication: no family at bedside  Disposition Plan: back to SNF once medically stable    Consultants:  CCS   Procedures:  Exp lap and G-tube placement : 06/28/15  Antibiotics:  Unasyn  8/14  HPI/Subjective: Afebrile, no acute distress. Still with wound bleeding. Patient tolerating TF's  Objective: Filed Vitals:   06/30/15 0503  BP: 188/96  Pulse: 98  Temp:   Resp:     Intake/Output Summary (Last 24 hours) at 06/30/15 1211 Last data filed at 06/30/15 0500  Gross per 24 hour  Intake 1278.75 ml  Output   1550 ml  Net -271.25 ml   Filed Weights   06/28/15 1606 06/30/15 0500  Weight: 70.3 kg (154 lb 15.7 oz) 68.6 kg (151 lb 3.8 oz)    Exam:   General:  able to follow simple commands and knot with his head; no fever and in no distress. Wound bleeding continues. Tolerating tube feedings   Cardiovascular: S1 and S2, no rubs or gallops  Respiratory: CTA bilaterally  Abdomen: with dressing over wound in mid abd; G-tube in place; positive BS; tolerating TF's  Musculoskeletal: no edema, no cyanosis, no clubbing   Data Reviewed: Basic Metabolic Panel:  Recent Labs Lab 06/24/15 0550 06/26/15 0646 06/29/15 0315  NA 139 135 135  K 3.6 3.4* 3.7  CL 109 106 103  CO2 23 20* 22  GLUCOSE 104* 105* 94  BUN 22* 17 12  CREATININE 0.73 0.64 0.67  CALCIUM 8.0* 7.9* 8.5*   Liver Function Tests:  Recent Labs Lab 06/26/15 0646 06/29/15 0315  AST 143* 96*  ALT 204* 174*  ALKPHOS 66 75  BILITOT 0.8 1.0  PROT 5.6* 6.5  ALBUMIN 2.4* 3.1*   CBC:  Recent Labs Lab 06/26/15 0646 06/27/15 0611 06/28/15 1002 06/29/15 0315 06/30/15 0233  WBC 6.7  --  9.5 11.1* 14.5*  NEUTROABS 4.9  --  7.9*  --   --   HGB 12.1*  --  14.1 13.4 13.4  HCT 35.7*  --  39.9 38.6* 38.7*  MCV 92.5  --  91.5 92.3 92.6  PLT 70* 83* 82* 86* 116*   BNP (last 3 results)  Recent Labs  06/19/15 1415  BNP 349.3*   CBG:  Recent Labs Lab 06/29/15 1610 06/29/15 1946  06/30/15 0044 06/30/15 0503 06/30/15 0731  GLUCAP 122* 101* 136* 142* 141*    Recent Results (from the past 240 hour(s))  MRSA PCR Screening     Status: None   Collection Time: 06/29/15  6:02 AM  Result Value Ref Range Status   MRSA by PCR NEGATIVE NEGATIVE Final    Comment:        The GeneXpert MRSA Assay (FDA approved for NASAL specimens only), is one component of a comprehensive MRSA colonization surveillance program. It is not intended to diagnose MRSA infection nor to guide or monitor treatment for MRSA infections.      Studies: No results found.  Scheduled Meds: . amLODipine  10 mg Oral Daily  . ampicillin-sulbactam (UNASYN) IV  3 g Intravenous Q6H  . antiseptic oral rinse  7 mL Mouth Rinse q12n4p  . chlorhexidine  15 mL Mouth Rinse BID  . cloNIDine  0.2 mg Oral TID  . free water  200 mL Per Tube 3 times per day  . hydrALAZINE  100 mg Per Tube 3 times per day  . labetalol  300 mg Oral TID  . thiamine  100 mg Intravenous Daily  . thiamine  100 mg Oral Daily   Continuous Infusions: . feeding supplement (JEVITY 1.5 CAL/FIBER) 1,000 mL (06/29/15 1709)    Active Problems:   Stroke due to intracerebral hemorrhage   Cocaine abuse   HTN (hypertension), malignant   Elevated liver enzymes   Dysphagia   Peritonitis   PEG tube malfunction   Protein-calorie malnutrition, severe    Time spent: 30 minutes    Barton Dubois  [facility] Pager [phone]. If 7PM-7AM, please contact night-coverage at [email or web address], password [facility] 06/30/2015, 12:11 PM  LOS: 2 days

## 2015-06-30 NOTE — Progress Notes (Signed)
Utilization review completed. Vernee Baines, RN, BSN. 

## 2015-06-30 NOTE — Clinical Social Work Note (Signed)
Clinical Social Work Assessment  Patient Details  Name: Mike Rowe MRN: [id number] Date of Birth: [birth year]  Date of referral:  06/30/15               Reason for consult:  Facility Placement, Discharge Planning                Permission sought to share information with:  Facility Sport and exercise psychologist, Other (Patient with no family support.) Permission granted to share information::   (Patient disoriented x4 and no family support available.)  Name::      (n/a)  Agency::  [facility]  Relationship::   (n/a)  Contact Information:   (n/a)  Housing/Transportation Living arrangements for the past 2 months:  [place], [place] ([place] as of 06/27/2015.) Source of Information:  Facility, Other (Comment Required) (Medical records.) Patient Interpreter Needed:  None Criminal Activity/Legal Involvement Pertinent to Current Situation/Hospitalization:  No - Comment as needed Significant Relationships:  None Lives with:  Facility Resident Do you feel safe going back to the place where you live?   (Patient unable to answer due to disorientation x4.) Need for family participation in patient care:  No (Coment) (Patient with no family support.)  Care giving concerns:  Patient with no active caregiver. Per chart review, patient has court date arranged in September for Guardianship. Patient admitted from [facility] and [place]. Per [facility] admissions liaison, patient able to return once medically stable.   Social Worker assessment / plan:  CSW received referral for possible SNF placement at time of discharge. CSW completed chart review as patient disoriented x4 with no family contact information available. Per chart review, patient without support system. Patient recently discharged to [facility] with pending court date for Guardianship. CSW has contacted [place] who stated patient is able to return at time of  discharge. CSW has also attempted to contact CSW Surveyor, quantity regarding discharge disposition. CSW to continue to follow and assist with discharge.  Employment status:  Retired, [employer] information:  Self Pay (Medicaid Pending) PT Recommendations:  [place] / Referral to community resources:  [place]  Patient/Family's Response to care:  Patient to return to [facility] at time of discharge.  Patient/Family's Understanding of and Emotional Response to Diagnosis, Current Treatment, and Prognosis:  Patient to return to [facility] at time of discharge.  Emotional Assessment Appearance:  Other (Comment Required (Patient disoriented x4 and unable to participate in assessment.) Attitude/Demeanor/Rapport:  Other (Patient disoriented x4 and unable to participate in assessment.) Affect (typically observed):  Other (Patient disoriented x4 and unable to participate in assessment.) Orientation:    Alcohol / Substance use:  Not Applicable Psych involvement (Current and /or in the community):  No (Comment) (Not approrpriate on this admission.)  Discharge Needs  Concerns to be addressed:  Decision making concerns, Lack of Support, Financial / Insurance Concerns Readmission within the last 30 days:  Yes Current discharge risk:  Cognitively Impaired, Lack of support system Barriers to Discharge:  No Barriers Identified   Caroline Sauger, LCSW 06/30/2015, 11:40 AM [phone]

## 2015-06-30 NOTE — Progress Notes (Signed)
Patient ID: Mike Rowe, male   DOB: 26-Jun-1956, 59 y.o.   MRN: [id number]   LOS: 2 days   Subjective: Minimally interactive   Objective: Vital signs in last 24 hours: Temp:  [97.8 F (36.6 C)-98.3 F (36.8 C)] 97.8 F (36.6 C) (08/16 0327) Pulse Rate:  [87-100] 98 (08/16 0503) Resp:  [16-18] 18 (08/16 0327) BP: (182-193)/(96-103) 188/96 mmHg (08/16 0503) SpO2:  [96 %-98 %] 98 % (08/16 0327) Weight:  [68.6 kg (151 lb 3.8 oz)] 68.6 kg (151 lb 3.8 oz) (08/16 0500) Last BM Date: 06/25/15   Laboratory  CBC  Recent Labs  06/29/15 0315 06/30/15 0233  WBC 11.1* 14.5*  HGB 13.4 13.4  HCT 38.6* 38.7*  PLT 86* 116*   CBG (last 3)   Recent Labs  06/29/15 1946 06/30/15 0044 06/30/15 0503  GLUCAP 101* 136* 142*    Physical Exam General appearance: no distress Resp: clear to auscultation bilaterally Cardio: regular rate and rhythm GI: Soft, +BS, copious blood on dressing, still bleeding from inferior midline wound and now port site.   Assessment/Plan: S/p ex lap, open g-tube 2/2 premature PEG tube removal -- Tolerating TF. Surprisingly hgb stable and plts up but no question he's bleeding. Will check coags. Multiple medical problems -- per primary service    Lisette Abu, PA-C Pager: [phone] General Trauma PA Pager: [phone]  06/30/2015

## 2015-07-01 DIAGNOSIS — K9423 Gastrostomy malfunction: Secondary | ICD-10-CM

## 2015-07-01 DIAGNOSIS — R1310 Dysphagia, unspecified: Secondary | ICD-10-CM

## 2015-07-01 LAB — ABO/RH: ABO/RH(D): B POS

## 2015-07-01 LAB — CBC
HEMATOCRIT: 29.2 % — AB (ref 39.0–52.0)
HEMOGLOBIN: 10 g/dL — AB (ref 13.0–17.0)
MCH: 32.1 pg (ref 26.0–34.0)
MCHC: 34.2 g/dL (ref 30.0–36.0)
MCV: 93.6 fL (ref 78.0–100.0)
Platelets: 79 10*3/uL — ABNORMAL LOW (ref 150–400)
RBC: 3.12 MIL/uL — ABNORMAL LOW (ref 4.22–5.81)
RDW: 14.4 % (ref 11.5–15.5)
WBC: 10.7 10*3/uL — ABNORMAL HIGH (ref 4.0–10.5)

## 2015-07-01 LAB — BASIC METABOLIC PANEL
Anion gap: 7 (ref 5–15)
BUN: 18 mg/dL (ref 6–20)
CALCIUM: 8.1 mg/dL — AB (ref 8.9–10.3)
CHLORIDE: 104 mmol/L (ref 101–111)
CO2: 25 mmol/L (ref 22–32)
CREATININE: 0.63 mg/dL (ref 0.61–1.24)
GFR calc Af Amer: >60 mL/min (ref >60)
GFR calc non Af Amer: >60 mL/min (ref >60)
GLUCOSE: 152 mg/dL — AB (ref 65–99)
Potassium: 3.7 mmol/L (ref 3.5–5.1)
Sodium: 136 mmol/L (ref 135–145)

## 2015-07-01 LAB — GLUCOSE, CAPILLARY
GLUCOSE-CAPILLARY: 132 mg/dL — AB (ref 65–99)
GLUCOSE-CAPILLARY: 161 mg/dL — AB (ref 65–99)
GLUCOSE-CAPILLARY: 130 mg/dL — AB (ref 65–99)
Glucose-Capillary: 119 mg/dL — ABNORMAL HIGH (ref 65–99)
Glucose-Capillary: 145 mg/dL — ABNORMAL HIGH (ref 65–99)

## 2015-07-01 MED ORDER — SODIUM CHLORIDE 0.9 % IV SOLN
Freq: Once | INTRAVENOUS | Status: AC
  Administered 2015-07-01: 13:00:00 via INTRAVENOUS

## 2015-07-01 NOTE — Progress Notes (Deleted)
Pt discharged to home.  Discharge instructions explained to pt.  Pt has no questions at the time of discharge.  Pt states he has all belongings.  IV dc'd.  Pt ambulated off floor on his own.

## 2015-07-01 NOTE — Progress Notes (Signed)
[facility] PROGRESS NOTE  Mike Rowe [id number] DOB: May 27, 1956 DOA: 06/28/2015 PCP: No primary care provider on file.  Assessment/Plan: Dislodged gastrostomy tube -Gen. 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 -He intermittenly verbalizes yes/no and intermittenly follows one step commands -will need SNF resumption at discharge  Dysphagia -reinsertion of PEG by CCS on 06/28/15 -will resume medications, nutrition and free water per tube  Encephalopathy -Secondary to hemorrhagic stroke -close to baseline  -will need SNF at discharge  Hypertension -uncontrolled and experiencing rebound after being off clonidine -now that PEG is ready for use will resume home antihypertensive regimen and adjust further as needed -continue IV hydralazine PRN  Polysubstance abuse -Including cocaine, alcohol, tobacco -patient has stopped these habits since last admisison  Transaminatis  -Likely secondary to chronic hepatitis C (positive qualitative RNA) -worsen with ongoing stress demargination and dehydration -will monitor -holding statins for now -will need GI or  ID outpatient follow up for final decision on treatment of Hep C  Severe protein malnutrition -continue TF through PEG  Hyperlipidemia -Hold statin at this time in the setting of elevated LFTs -will follow LFT's and decide on best timing to resume statins if possible   Leukocytosis -continue current antibiotics  therapy  Thrombocytopenia and anemia of blood loss: hgb dropped 3 gms and platelets dropped to 79, last platelets hovering between 86 to 116.  prbc transfusion and platelets, FFP ordered. Repeat cbc in am.    Code Status: Full Family Communication: no family at bedside  Disposition Plan: back to SNF once medically stable    Consultants:  CCS   Procedures:  Exp lap and G-tube placement : 06/28/15  Antibiotics:  Unasyn  8/14  HPI/Subjective: Afebrile, no acute distress. Still with wound bleeding. Patient tolerating TF's  Objective: Filed Vitals:   07/01/15 1805  BP: 118/74  Pulse: 71  Temp: 98.4 F (36.9 C)  Resp: 16    Intake/Output Summary (Last 24 hours) at 07/01/15 1932 Last data filed at 07/01/15 1800  Gross per 24 hour  Intake   1282 ml  Output   1100 ml  Net    182 ml   Filed Weights   06/28/15 1606 06/30/15 0500 07/01/15 0500  Weight: 70.3 kg (154 lb 15.7 oz) 68.6 kg (151 lb 3.8 oz) 68.6 kg (151 lb 3.8 oz)    Exam:   General:  able to follow simple commands and knot with his head; no fever and in no distress. Wound bleeding continues. Tolerating tube feedings   Cardiovascular: S1 and S2, no rubs or gallops  Respiratory: CTA bilaterally  Abdomen: with dressing over wound in mid abd; G-tube in place; positive BS; tolerating TF's  Musculoskeletal: no edema, no cyanosis, no clubbing   Data Reviewed: Basic Metabolic Panel:  Recent Labs Lab 06/26/15 0646 06/29/15 0315 07/01/15 0535  NA 135 135 136  K 3.4* 3.7 3.7  CL 106 103 104  CO2 20* 22 25  GLUCOSE 105* 94 152*  BUN 17 12 18   CREATININE 0.64 0.67 0.63  CALCIUM 7.9* 8.5* 8.1*   Liver Function Tests:  Recent  Labs Lab 06/26/15 0646 06/29/15 0315  AST 143* 96*  ALT 204* 174*  ALKPHOS 66 75  BILITOT 0.8 1.0  PROT 5.6* 6.5  ALBUMIN 2.4* 3.1*   CBC:  Recent Labs Lab 06/26/15 0646 06/27/15 0611 06/28/15 1002 06/29/15 0315 06/30/15 0233 07/01/15 0535  WBC 6.7  --  9.5  11.1* 14.5* 10.7*  NEUTROABS 4.9  --  7.9*  --   --   --   HGB 12.1*  --  14.1 13.4 13.4 10.0*  HCT 35.7*  --  39.9 38.6* 38.7* 29.2*  MCV 92.5  --  91.5 92.3 92.6 93.6  PLT 70* 83* 82* 86* 116* 79*   BNP (last 3 results)  Recent Labs  06/19/15 1415  BNP 349.3*   CBG:  Recent Labs Lab 07/01/15 0008 07/01/15 0440 07/01/15 0720 07/01/15 1243 07/01/15 1709  GLUCAP 145* 132* 161* 130* 119*    Recent Results (from the past 240 hour(s))  MRSA PCR Screening     Status: None   Collection Time: 06/29/15  6:02 AM  Result Value Ref Range Status   MRSA by PCR NEGATIVE NEGATIVE Final    Comment:        The GeneXpert MRSA Assay (FDA approved for NASAL specimens only), is one component of a comprehensive MRSA colonization surveillance program. It is not intended to diagnose MRSA infection nor to guide or monitor treatment for MRSA infections.      Studies: No results found.  Scheduled Meds: . amLODipine  10 mg Oral Daily  . ampicillin-sulbactam (UNASYN) IV  3 g Intravenous Q6H  . antiseptic oral rinse  7 mL Mouth Rinse q12n4p  . chlorhexidine  15 mL Mouth Rinse BID  . cloNIDine  0.2 mg Oral TID  . free water  200 mL Per Tube 3 times per day  . hydrALAZINE  100 mg Per Tube 3 times per day  . labetalol  300 mg Oral TID  . thiamine  100 mg Intravenous Daily  . thiamine  100 mg Oral Daily   Continuous Infusions: . feeding supplement (JEVITY 1.5 CAL/FIBER) 1,000 mL (07/01/15 0245)    Active Problems:   Stroke due to intracerebral hemorrhage   Cocaine abuse   HTN (hypertension), malignant   Elevated liver enzymes   Dysphagia   Peritonitis   PEG tube malfunction   Protein-calorie malnutrition, severe    Time spent: 30 minutes    [facility] Pager [phone]. If 7PM-7AM, please contact night-coverage at [email or web address], password [facility] 07/01/2015, 7:32 PM  LOS: 3 days

## 2015-07-01 NOTE — Progress Notes (Signed)
Patient ID: Mike Rowe, male   DOB: 01-Oct-1956, 59 y.o.   MRN: [id number]   LOS: 3 days   Subjective: Minimally responsive.   Objective: Vital signs in last 24 hours: Temp:  [97.4 F (36.3 C)-99.4 F (37.4 C)] 98.8 F (37.1 C) (08/17 0500) Pulse Rate:  [77-89] 84 (08/17 0500) Resp:  [16-20] 16 (08/17 0500) BP: (137-159)/(82-95) 137/82 mmHg (08/17 0500) SpO2:  [93 %-95 %] 95 % (08/17 0500) Weight:  [68.6 kg (151 lb 3.8 oz)] 68.6 kg (151 lb 3.8 oz) (08/17 0500) Last BM Date: 06/25/15   Laboratory  CBC  Recent Labs  06/30/15 0233 07/01/15 0535  WBC 14.5* 10.7*  HGB 13.4 10.0*  HCT 38.7* 29.2*  PLT 116* 79*   BMET  Recent Labs  06/29/15 0315 07/01/15 0535  NA 135 136  K 3.7 3.7  CL 103 104  CO2 22 25  GLUCOSE 94 152*  BUN 12 18  CREATININE 0.67 0.63  CALCIUM 8.5* 8.1*    Physical Exam General appearance: no distress Resp: clear to auscultation bilaterally Cardio: regular rate and rhythm GI: Soft, +BS, continues to bleed   Assessment/Plan: S/p ex lap, open g-tube 2/2 premature PEG tube removal -- Tolerating TF ABL anemia -- Will try giving plts, FFP, and cryo; if that doesn't work suspect will need to return to OR to try and correct bleeding source Multiple medical problems -- per primary service    Lisette Abu, PA-C Pager: [phone] General Trauma PA Pager: [phone]  07/01/2015

## 2015-07-02 LAB — GLUCOSE, CAPILLARY
GLUCOSE-CAPILLARY: 124 mg/dL — AB (ref 65–99)
GLUCOSE-CAPILLARY: 125 mg/dL — AB (ref 65–99)
GLUCOSE-CAPILLARY: 146 mg/dL — AB (ref 65–99)
Glucose-Capillary: 146 mg/dL — ABNORMAL HIGH (ref 65–99)
Glucose-Capillary: 129 mg/dL — ABNORMAL HIGH (ref 65–99)
Glucose-Capillary: 126 mg/dL — ABNORMAL HIGH (ref 65–99)

## 2015-07-02 LAB — PREPARE PLATELET PHERESIS: UNIT DIVISION: 0

## 2015-07-02 LAB — CBC
HEMATOCRIT: 28.5 % — AB (ref 39.0–52.0)
HEMOGLOBIN: 9.8 g/dL — AB (ref 13.0–17.0)
MCH: 32.5 pg (ref 26.0–34.0)
MCHC: 34.4 g/dL (ref 30.0–36.0)
MCV: 94.4 fL (ref 78.0–100.0)
Platelets: 72 10*3/uL — ABNORMAL LOW (ref 150–400)
RBC: 3.02 MIL/uL — ABNORMAL LOW (ref 4.22–5.81)
RDW: 14.4 % (ref 11.5–15.5)
WBC: 6.2 10*3/uL (ref 4.0–10.5)

## 2015-07-02 LAB — PREPARE CRYOPRECIPITATE: Unit division: 0

## 2015-07-02 LAB — BASIC METABOLIC PANEL
Anion gap: 7 (ref 5–15)
BUN: 17 mg/dL (ref 6–20)
CHLORIDE: 107 mmol/L (ref 101–111)
CO2: 24 mmol/L (ref 22–32)
Calcium: 8.4 mg/dL — ABNORMAL LOW (ref 8.9–10.3)
Creatinine, Ser: 0.57 mg/dL — ABNORMAL LOW (ref 0.61–1.24)
GFR calc Af Amer: >60 mL/min (ref >60)
GFR calc non Af Amer: >60 mL/min (ref >60)
Glucose, Bld: 149 mg/dL — ABNORMAL HIGH (ref 65–99)
POTASSIUM: 3.9 mmol/L (ref 3.5–5.1)
SODIUM: 138 mmol/L (ref 135–145)

## 2015-07-02 LAB — PREPARE FRESH FROZEN PLASMA

## 2015-07-02 NOTE — Progress Notes (Addendum)
[facility] PROGRESS NOTE  Mike Rowe VFI:[id number] DOB: [birth year] DOA: 06/28/2015 PCP: No primary care provider on file.  Assessment/Plan: Dislodged gastrostomy tube -Gen. 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 -He intermittenly verbalizes yes/no and intermittenly follows one step commands -will need SNF resumption at discharge  Dysphagia -reinsertion of PEG by CCS on 06/28/15 -will resume medications, nutrition and free water per tube  Encephalopathy -Secondary to hemorrhagic stroke -close to baseline  -will need SNF at discharge  Hypertension -uncontrolled and experiencing rebound after being off clonidine -now that PEG is ready for use will resume home antihypertensive regimen and adjust further as needed -continue IV hydralazine PRN  Polysubstance abuse -Including cocaine, alcohol, tobacco -patient has stopped these habits since last admisison  Transaminatis  -Likely secondary to chronic hepatitis C (positive qualitative RNA) -worsen with ongoing stress demargination and dehydration -will monitor -holding statins for now -will need GI or  ID outpatient follow up for final decision on treatment of Hep C  Severe protein malnutrition -continue TF through PEG  Hyperlipidemia -Hold statin at this time in the setting of elevated LFTs -will follow LFT's and decide on best timing to resume statins if possible   Leukocytosis -continue current antibiotics  therapy  Thrombocytopenia and anemia of blood loss: hgb dropped 3 gms and platelets dropped to 79, last platelets hovering between 86 to 116.  prbc transfusion and platelets, FFP ordered. Repeat cbc in am shows stable hemoglobin. Bleeding appears to have stopped. Plan for SNF i n am.    Code Status: Full Family Communication: no family at bedside  Disposition Plan: back to SNF if counts are stable in am.    Consultants:  CCS   Procedures:  Exp lap and G-tube placement : 06/28/15  Antibiotics:  Unasyn  8/14  HPI/Subjective: Afebrile, no acute distress.  Patient tolerating TF's  Objective: Filed Vitals:   07/02/15 1708  BP: 129/68  Pulse: 66  Temp:   Resp:     Intake/Output Summary (Last 24 hours) at 07/02/15 1852 Last data filed at 07/02/15 1629  Gross per 24 hour  Intake 1385.5 ml  Output   1250 ml  Net  135.5 ml   Filed Weights   06/28/15 1606 06/30/15 0500 07/01/15 0500  Weight: 70.3 kg (154 lb 15.7 oz) 68.6 kg (151 lb 3.8 oz) 68.6 kg (151 lb 3.8 oz)    Exam:   General:  able to follow simple commands and knot with his head; no fever and in no distress. Wound bleeding continues. Tolerating tube feedings   Cardiovascular: S1 and S2, no rubs or gallops  Respiratory: CTA bilaterally  Abdomen: with dressing over wound in mid abd; G-tube in place; positive BS; tolerating TF's  Musculoskeletal: no edema, no cyanosis, no clubbing   Data Reviewed: Basic Metabolic Panel:  Recent Labs Lab 06/26/15 0646 06/29/15 0315 07/01/15 0535 07/02/15 0519  NA 135 135 136 138  K 3.4* 3.7 3.7 3.9  CL 106 103 104 107  CO2 20* 22 25 24   GLUCOSE 105* 94 152* 149*  BUN 17 12 18 17   CREATININE  0.64 0.67 0.63 0.57*  CALCIUM 7.9* 8.5* 8.1* 8.4*   Liver Function Tests:  Recent Labs Lab 06/26/15 0646 06/29/15 0315  AST 143* 96*  ALT 204* 174*  ALKPHOS 66 75  BILITOT 0.8 1.0  PROT 5.6* 6.5  ALBUMIN 2.4* 3.1*   CBC:  Recent Labs Lab 06/26/15 0646   06/28/15 1002 06/29/15 0315 06/30/15 0233 07/01/15 0535 07/02/15 0519  WBC 6.7  --  9.5 11.1* 14.5* 10.7* 6.2  NEUTROABS 4.9  --  7.9*  --   --   --   --   HGB 12.1*  --  14.1 13.4 13.4 10.0* 9.8*  HCT 35.7*  --  39.9 38.6* 38.7* 29.2* 28.5*  MCV 92.5  --  91.5 92.3 92.6 93.6 94.4  PLT 70*  < > 82* 86* 116* 79* 72*  < > = values in this interval not displayed. BNP (last 3 results)  Recent Labs  06/19/15 1415  BNP 349.3*   CBG:  Recent Labs Lab 07/02/15 0012 07/02/15 0412 07/02/15 0749 07/02/15 1225 07/02/15 1703  GLUCAP 125* 124* 146* 146* 129*    Recent Results (from the past 240 hour(s))  MRSA PCR Screening     Status: None   Collection Time: 06/29/15  6:02 AM  Result Value Ref Range Status   MRSA by PCR NEGATIVE NEGATIVE Final    Comment:        The GeneXpert MRSA Assay (FDA approved for NASAL specimens only), is one component of a comprehensive MRSA colonization surveillance program. It is not intended to diagnose MRSA infection nor to guide or monitor treatment for MRSA infections.      Studies: No results found.  Scheduled Meds: . amLODipine  10 mg Oral Daily  . ampicillin-sulbactam (UNASYN) IV  3 g Intravenous Q6H  . antiseptic oral rinse  7 mL Mouth Rinse q12n4p  . chlorhexidine  15 mL Mouth Rinse BID  . cloNIDine  0.2 mg Oral TID  . free water  200 mL Per Tube 3 times per day  . hydrALAZINE  100 mg Per Tube 3 times per day  . labetalol  300 mg Oral TID  . thiamine  100 mg Intravenous Daily  . thiamine  100 mg Oral Daily   Continuous Infusions: . feeding supplement (JEVITY 1.5 CAL/FIBER) 1,000 mL (07/02/15 1420)    Active Problems:   Stroke due to intracerebral hemorrhage   Cocaine abuse   HTN (hypertension), malignant   Elevated liver enzymes   Dysphagia   Peritonitis   PEG tube malfunction   Protein-calorie malnutrition, severe    Time spent: 30 minutes    [facility] Pager [phone]. If 7PM-7AM,  please contact night-coverage at [email or web address], password [facility] 07/02/2015, 6:52 PM  LOS: 4 days

## 2015-07-02 NOTE — Progress Notes (Signed)
Patient ID: Mike Rowe, male   DOB: 11/19/1955, 59 y.o.   MRN: [id number]   LOS: 4 days   Subjective: Minimally responsive. RN reports little to no further bleeding after products given yesterday.   Objective: Vital signs in last 24 hours: Temp:  [97.8 F (36.6 C)-98.6 F (37 C)] 98.2 F (36.8 C) (08/18 0514) Pulse Rate:  [55-96] 55 (08/18 0514) Resp:  [16-18] 16 (08/18 0514) BP: (108-152)/(55-82) 119/66 mmHg (08/18 0514) SpO2:  [95 %-100 %] 98 % (08/18 0514) Last BM Date: 06/25/15   Laboratory  CBC  Recent Labs  07/01/15 0535 07/02/15 0519  WBC 10.7* 6.2  HGB 10.0* 9.8*  HCT 29.2* 28.5*  PLT 79* 72*   BMET  Recent Labs  07/01/15 0535 07/02/15 0519  NA 136 138  K 3.7 3.9  CL 104 107  CO2 25 24  GLUCOSE 152* 149*  BUN 18 17  CREATININE 0.63 0.57*  CALCIUM 8.1* 8.4*    Physical Exam General appearance: no distress Resp: clear to auscultation bilaterally Cardio: regular rate and rhythm GI: Soft, g-tube in place, incision intact, minimal blood on dressing, +BS   Assessment/Plan: S/p ex lap, open g-tube 2/2 premature PEG tube removal -- Tolerating TF ABL anemia/thrombocytopenia -- Appears stable at this point Multiple medical problems -- per primary service Dispo -- [place] for discharge to SNF from surgical standpoint    Lisette Abu, PA-C Pager: [phone] General Trauma PA Pager: [phone]  07/02/2015

## 2015-07-02 NOTE — Progress Notes (Signed)
Physical Therapy Treatment Patient Details Name: Mike Rowe MRN: [id number] DOB: [birth year] Today's Date: 07/02/2015    History of Present Illness Mike Rowe is a 59 y/o M w/ recent intracerebral hemorrhage 2/2 uncontrolled HTN in the setting of cocaine use and was d/c to [facility] on 06/26/15.  Gastrostomy tube was placed on 06/26/15 which unfortunately dislodged.  Pt now s/p ex lap and PEG placement (06/28/15).      PT Comments    [name] showed very modest progress today w/ therapeutic activity.  Lt lateral lean in sitting was not as strong; however required max>total assist for sitting EOB as pt lean posteriorly.  He was able to follow one step commands inconsistently.  Pt will benefit from continued skilled PT services to increase functional independence and safety.   Follow Up Recommendations  SNF;Supervision/Assistance - 24 hour     Equipment Recommendations  None recommended by PT    Recommendations for Other Services       Precautions / Restrictions Precautions Precautions: Fall Precaution Comments: L hemi, strong L lateral lean, R mit Restrictions Weight Bearing Restrictions: No    Mobility  Bed Mobility Overal bed mobility: +2 for physical assistance;Needs Assistance Bed Mobility: Supine to Sit;Sit to Supine Rolling: Total assist;+2 for physical assistance   Supine to sit: +2 for physical assistance;Total assist Sit to supine: Total assist;+2 for physical assistance   General bed mobility comments: Total +2 assist w/ trunk posteriorly and Bil LEs.  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 to maintain upright posture sitting EOB Postural control: Posterior lean                          Cognition Arousal/Alertness: Awake/alert Behavior During Therapy: Agitated;Flat affect Overall Cognitive Status: Difficult to assess Area of Impairment: Attention;Following commands;Safety/judgement;Awareness;Problem solving Orientation Level: Time;Situation;Place;Disoriented to Current Attention Level: Focused   Following Commands: Follows one step commands inconsistently Safety/Judgement: Decreased awareness of safety;Decreased awareness of deficits Awareness: Intellectual Problem Solving: Slow processing;Decreased initiation;Difficulty sequencing;Requires verbal cues;Requires tactile cues General Comments: Pt w/ inconsistent response to verbal and tactile cues.  When asked to move Lt leg he moves his Rt (same for UEs)    Exercises Other Exercises Other Exercises: Reaching w/ Rt UE ipsilaterally and contralaterally.  (Requires total assist for face hygeine w/ Lt UE) Other Exercises: Passive stretch in to neutral cervical position (pt demonstrates Rt torticollis)    General Comments General comments (skin integrity, edema, etc.): Pt not as agitated this session.      Pertinent Vitals/Pain Pain Assessment: No/denies pain Faces Pain Scale: Hurts a little bit Pain Location: neck Pain Descriptors / Indicators: Grimacing (w/ propping up pillows on R side 2/2 Rt torticollis) Pain Intervention(s): Limited activity within patient's tolerance;Monitored during session;Repositioned  Home Living                      Prior Function            PT Goals (current goals can now be found in the care plan  section) Acute Rehab PT Goals Patient Stated Goal: none stated PT Goal Formulation: Patient unable to participate in goal setting Time For Goal Achievement: 07/13/15 Potential to Achieve Goals: Fair Progress towards PT goals: Progressing toward goals (very modestly)    Frequency  Min 2X/week    PT Plan Current plan remains appropriate    Co-evaluation             End of Session   Activity Tolerance: Patient tolerated treatment well;Treatment limited secondary to agitation Patient left: in bed;with call bell/phone within reach;with SCD's reapplied;with bed alarm set     Time: 6433-2951 PT Time Calculation (min) (ACUTE ONLY): 25 min  Charges:  $Therapeutic Activity: 23-37 mins                    G Codes:      Joslyn Hy PT, DPT [phone] Pager: [phone] 07/02/2015, 4:52 PM

## 2015-07-03 LAB — CBC
HEMATOCRIT: 27.1 % — AB (ref 39.0–52.0)
HEMOGLOBIN: 9.1 g/dL — AB (ref 13.0–17.0)
MCH: 31.8 pg (ref 26.0–34.0)
MCHC: 33.6 g/dL (ref 30.0–36.0)
MCV: 94.8 fL (ref 78.0–100.0)
Platelets: 82 10*3/uL — ABNORMAL LOW (ref 150–400)
RBC: 2.86 MIL/uL — ABNORMAL LOW (ref 4.22–5.81)
RDW: 14.4 % (ref 11.5–15.5)
WBC: 5.5 10*3/uL (ref 4.0–10.5)

## 2015-07-03 LAB — GLUCOSE, CAPILLARY
GLUCOSE-CAPILLARY: 128 mg/dL — AB (ref 65–99)
GLUCOSE-CAPILLARY: 138 mg/dL — AB (ref 65–99)
GLUCOSE-CAPILLARY: 113 mg/dL — AB (ref 65–99)
Glucose-Capillary: 144 mg/dL — ABNORMAL HIGH (ref 65–99)

## 2015-07-03 MED ORDER — JEVITY 1.5 CAL/FIBER PO LIQD: 1000.0000 mL | ORAL | Status: DC

## 2015-07-03 MED ORDER — FREE WATER: 200.0000 mL | Freq: Three times a day (TID) | Status: DC

## 2015-07-03 NOTE — Progress Notes (Signed)
Patient ID: Mike Rowe, male   DOB: January 27, 1956, 59 y.o.   MRN: [id number]   LOS: 5 days   Subjective: Minimally communicative.   Objective: Vital signs in last 24 hours: Temp:  [97.7 F (36.5 C)-98.9 F (37.2 C)] 97.7 F (36.5 C) (08/19 0537) Pulse Rate:  [60-72] 65 (08/19 0537) Resp:  [16-18] 16 (08/19 0537) BP: (116-140)/(64-77) 140/75 mmHg (08/19 0537) SpO2:  [95 %-99 %] 97 % (08/19 0537) Weight:  [67.8 kg (149 lb 7.6 oz)-68.5 kg (151 lb 0.2 oz)] 67.8 kg (149 lb 7.6 oz) (08/19 0300) Last BM Date: 06/25/15   Laboratory  CBC  Recent Labs  07/02/15 0519 07/03/15 0306  WBC 6.2 5.5  HGB 9.8* 9.1*  HCT 28.5* 27.1*  PLT 72* 82*    Physical Exam General appearance: no distress Resp: clear to auscultation bilaterally Cardio: regular rate and rhythm GI: Soft, +BS, dressing in place and clean   Assessment/Plan: S/p ex lap, open g-tube 2/2 premature PEG tube removal -- Tolerating TF ABL anemia/thrombocytopenia -- Appears stable at this point Multiple medical problems -- per primary service [place] for discharge to SNF from surgical standpoint. Staples will need to be removed between 8/24 and 8/28, ok for SNF to perform.    Lisette Abu, PA-C Pager: [phone] General Trauma PA Pager: [phone]  07/03/2015

## 2015-07-03 NOTE — Care Management Note (Signed)
Case Management Note  Patient Details  Name: Mike Rowe MRN: [id number] Date of Birth: [birth year]  Subjective/Objective:    Pt s/p exp lap and g-tube placement.  PTA, pt resided at [facility] and [facility].                  Action/Plan: Plan dc back to SNF today, per CSW arrangements.    Expected Discharge Date:   07/03/2015               Expected Discharge Plan:  Skilled Nursing Facility  In-House Referral:  Clinical Social Work  Discharge planning Services  CM Consult  Post Acute Care Choice:    Choice offered to:     DME Arranged:    DME Agency:     HH Arranged:    [place] Agency:     Status of Service:  Completed, signed off  Medicare Important Message Given:    Date Medicare IM Given:    Medicare IM give by:    Date Additional Medicare IM Given:    Additional Medicare Important Message give by:     If discussed at [place] of Stay Meetings, dates discussed:    Additional Comments:  Reinaldo Raddle, RN, BSN  Trauma/Neuro ICU Case Manager [phone]

## 2015-07-03 NOTE — Discharge Summary (Signed)
Physician Discharge Summary  Mike Rowe XFG:[id number] DOB: [birth year] DOA: 06/28/2015  PCP: No primary care provider on file.  Admit date: 06/28/2015 Discharge date: 07/03/2015  Time spent: 30 minutes  Recommendations for Outpatient Follow-up:  1. Follow up with PCP in one week.  2. Follow up with GI for treatment of hepatitis c 3. Staples will need to be removed between 8/24 and 8/28, ok for SNF to perform.  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   Filed Weights   07/01/15 0500 07/02/15 1900 07/03/15 0300  Weight: 68.6 kg (151 lb 3.8 oz) 68.5 kg (151 lb 0.2 oz) 67.8 kg (149 lb 7.6 oz)    History of present illness:   59 year old male with no previous medical history except for recent intracerebral hemorrhage for which she was recently discharged from [facility] on 06/26/2015. He was discharged to [facility]. During his last hospitalization, the patient was seen by neurology for his right thalamic hemorrhage which was thought to be secondary to uncontrolled hypertension in the setting of cocaine use. His hospitalization was also complicated by aspiration pneumonitis and uncontrolled blood pressure. Gastrostomy tube was placed by Dr. Georganna Skeans on 06/26/15. The patient was on Jevity at 55 mL per hour. Unfortunately, the patient had dislodgment of his gastrostomy tube. He was sent to [facility] emergency department. They attempted to reinsert gastrostomy tube. Gastrografin study post insertion revealed extravasation of the contrast around the spleen. Dr. Grandville Silos was consulted and requested transfer to [facility].  Hospital Course:  Dislodged gastrostomy tube -Gen. surgery has been consulted and took the patient for  laparotomy and placement of new gastrostomy tube on 06/28/2015 -patient tolerated procedure well overall - completed 5 days of Unasyn.  - Staples will need to be removed between 8/24 and 8/28, ok for SNF to perform. -Hgb is stable  Right thalamic hemorrhagic stroke -Secondary to uncontrolled hypertension and in setting of Cocaine use -LDL 97 -Hemoglobin A1c 5.2 -Patient has residual left hemiplegia with left-sided neglection and dysphagia -He intermittenly verbalizes yes/no and intermittenly follows one step commands -will need SNF resumption at discharge  Dysphagia -reinsertion of PEG by [facility] on 06/28/15 -will resume medications, nutrition and free water per tube  Encephalopathy -Secondary to hemorrhagic stroke -close to baseline  -will need SNF at discharge  Hypertension -uncontrolled and experiencing rebound after being off clonidine -now that PEG is ready for use will resume home antihypertensive regimen and adjust further as needed - resume clonidine.   Polysubstance abuse -Including cocaine, alcohol, tobacco -patient has stopped these habits since last admisison  Transaminatis  -Likely secondary to chronic hepatitis C (positive qualitative RNA) -worsen with ongoing stress demargination and dehydration -will monitor -holding statins for now -will need GI or ID outpatient follow up for final decision on treatment of Hep C  Severe protein malnutrition -continue TF through PEG  Hyperlipidemia -Hold statin at this time in the setting of elevated LFTs licer function tests are improving.   Leukocytosis - resolved.   Thrombocytopenia and anemia of blood loss: hgb dropped 3 gms and platelets dropped to 79, last platelets hovering between 86 to 116.  prbc transfusion and platelets, FFP ordered. Repeat cbc in am shows stable hemoglobin. Bleeding appears to have stopped.  Procedures:  Exp lap and G-tube placement : 06/28/15  Consultations: Surgery.  Discharge  Exam: Filed Vitals:   07/03/15 1321  BP: 122/77  Pulse: 70  Temp: 98.7 F (37.1 C)  Resp: 15    General: alert, confused, but comfortable Cardiovascular: s1s2 Respiratory: ctab  Discharge Instructions   Discharge Instructions    Discharge instructions    Complete by:  As directed   Follow up with PCP in 2 weeks.  Please check CBC in one week.  Please get staples removed as per surgery recommendations.          Current Discharge Medication List    CONTINUE these medications which have CHANGED   Details  Nutritional Supplements (FEEDING SUPPLEMENT, JEVITY 1.5 CAL/FIBER,) LIQD Place 1,000 mLs into feeding tube continuous.    Water For Irrigation, Sterile (FREE WATER) SOLN Place 200 mLs into feeding tube every 8 (eight) hours.      CONTINUE these medications which have NOT CHANGED   Details  acetaminophen (TYLENOL) 160 MG/5ML solution Place 20.3 mLs (650 mg total) into feeding tube every 4 (four) hours as needed for fever (temp >99). Qty: 120 mL, Refills: 0    Amino Acids-Protein Hydrolys (FEEDING SUPPLEMENT, PRO-STAT SUGAR FREE 64,) LIQD Place 30 mLs into feeding tube daily. Qty: 900 mL, Refills: 0    amLODipine (NORVASC) 10 MG tablet Take 1 tablet (10 mg total) by mouth daily. Qty: 30 tablet, Refills: 1    antiseptic oral rinse (CPC / CETYLPYRIDINIUM CHLORIDE 0.05%) 0.05 % LIQD solution 7 mLs by Mouth Rinse route 2 times daily at 12 noon and 4 pm. Qty: 60 mL, Refills: 1    bisacodyl (DULCOLAX) 10 MG suppository Place 1 suppository (10 mg total) rectally daily as needed for moderate constipation. Qty: 12 suppository, Refills: 0    cloNIDine (CATAPRES) 0.2 MG tablet Take 1 tablet (0.2 mg total) by mouth 3 (three) times daily. Qty: 100 tablet, Refills: 3    folic acid (FOLVITE) 1 MG tablet Take 1 tablet (1 mg total) by mouth daily. Qty: 30 tablet, Refills: 1    hydrALAZINE (APRESOLINE) 100 MG tablet Place 1 tablet (100 mg total) into feeding tube every 8 (eight)  hours. Qty: 100 tablet, Refills: 2    insulin aspart (NOVOLOG) 100 UNIT/ML injection Inject 2-6 Units into the skin every 4 (four) hours. Qty: 10 mL, Refills: 11    labetalol (NORMODYNE) 300 MG tablet Take 1 tablet (300 mg total) by mouth 3 (three) times daily. Qty: 100 tablet, Refills: 2    Multiple Vitamin (MULTIVITAMIN) LIQD Place 5 mLs into feeding tube daily. Qty: 60 mL, Refills: 3    thiamine 100 MG tablet Take 1 tablet (100 mg total) by mouth daily. Qty: 30 tablet, Refills: 2      STOP taking these medications     heparin 5000 UNIT/ML injection        No Known Allergies    The results of significant diagnostics from this hospitalization (including imaging, microbiology, ancillary and laboratory) are listed below for reference.    Significant Diagnostic Studies: Dg Abd 1 View  06/24/2015   CLINICAL DATA:  A single supine abdominal film is submitted. The patient has apparently undergone placement of an enteric feeding tube.  EXAM: ABDOMEN - 1 VIEW  COMPARISON:  Abdominal film of June 18, 2015  FINDINGS: The radiodense tipped feeding tube lies in the proximal jejunum. Contrast outlines a normal to jejunal mucosal pattern.  IMPRESSION: The tip of the feeding tube appears  to lie in the proximal jejunum.   Electronically Signed   By: David  Martinique M.D.   On: 06/24/2015 10:57   Ct Head Wo Contrast  06/18/2015   CLINICAL DATA:  Follow-up intracranial hemorrhage. History of hypertension, cocaine abuse, alcohol dependence.  EXAM: CT HEAD WITHOUT CONTRAST  TECHNIQUE: Contiguous axial images were obtained from the base of the skull through the vertex without intravenous contrast.  COMPARISON:  CT head June 15, 2015 and CT head June 05, 2015  FINDINGS: Patient is obliqued in the scanner, limiting evaluation.  4.6 x 3.3 cm evolving RIGHT thalamus intraparenchymal hematoma with surrounding low-density vasogenic edema. Intraventricular extension with small amount of dependent blood products  in the occipital horns. Partially effaced RIGHT temporal horn, stable moderate hydrocephalus with mild interstitial edema. Local mass effect, limited assessment for midline shift due to patient positioning. No acute large vascular territory infarct.  No abnormal extra-axial fluid collections. Stable density along the falx most consistent with calcification. Basal cisterns appear patent though, decreased by positioning. Moderate calcific atherosclerosis the carotid siphons. No skull fracture. Nasogastric tube in place.  IMPRESSION: Limited assessment due to patient positioning within the CT scanner.  Evolving RIGHT thalamic hemorrhage with intraventricular extension, stable moderate hydrocephalus.   Electronically Signed   By: Elon Alas M.D.   On: 06/18/2015 23:54   Ct Head Wo Contrast  06/15/2015   CLINICAL DATA:  Intracranial hemorrhage  EXAM: CT HEAD WITHOUT CONTRAST  TECHNIQUE: Contiguous axial images were obtained from the base of the skull through the vertex without intravenous contrast.  COMPARISON:  June 12, 2015  FINDINGS: The hemorrhage arising from the right basal ganglia region measures 4.5 x 3.6 cm, essentially unchanged. There is surrounding vasogenic type edema. This hemorrhagic lesion impresses upon the third ventricle, deviating the third ventricle 8 mm from the midline toward the left. This degree of midline shift of the third ventricle is not appreciably changed. There is hemorrhage extending into the occipital horn of each lateral ventricle with layering hemorrhage in the occipital horns of the lateral ventricles, not significantly changed. The lateral ventricles remain borderline prominent but stable. Fourth ventricle is in the midline.  There is no new gray-white compartment lesion compared to recent prior study.  Bony calvarium appears intact.  The mastoid air cells are clear.  There is moderate edema inferior to the hemorrhage in the medial right temporal lobe, stable.  IMPRESSION:  Essentially no change compared to recent prior study. Hemorrhagic lesion measuring 4.5 x 3.6 arising from the right basal ganglia with surrounding edema, stable. Midline shift of the third ventricle toward the left is stable. Slight ventricular prominence with layering hemorrhage in the occipital horns of the lateral ventricles remain without appreciable change. No change in overall ventricular size and configuration. No new infarct compared to recent prior study.   Electronically Signed   By: Lowella Grip III M.D.   On: 06/15/2015 07:08   Ct Head Wo Contrast  06/12/2015   CLINICAL DATA:  Followup hemorrhagic stroke.  EXAM: CT HEAD WITHOUT CONTRAST  TECHNIQUE: Contiguous axial images were obtained from the base of the skull through the vertex without intravenous contrast.  COMPARISON:  CT head June 09, 2015  FINDINGS: RIGHT thalamic/basal ganglia 4.5 x 3.5 cm dense intraparenchymal hematoma is unchanged in size, surrounding low-density vasogenic edema. Again noted is intraventricular extension with layering blood products in the occipital horn. Local mass effect, partially effaced lateral ventricle without obstructive hydrocephalus.  No acute large vascular territory infarct.  Basal cisterns are patent. Mild calcific atherosclerosis of the carotid siphons.  Ocular globes and orbital contents are nonsuspicious, mild enophthalmos suspected. Visualized paranasal sinuses and mastoid air cells are well aerated.  IMPRESSION: Evolving RIGHT basal ganglia/ thalamic intraparenchymal hematoma with similar intraventricular extension and local mass effect. No hydrocephalus.   Electronically Signed   By: Elon Alas M.D.   On: 06/12/2015 03:00   Ct Head Wo Contrast  06/09/2015   CLINICAL DATA:  Right facial droop. Right intraparenchymal hemorrhage.  EXAM: CT HEAD WITHOUT CONTRAST  TECHNIQUE: Contiguous axial images were obtained from the base of the skull through the vertex without intravenous contrast.   COMPARISON:  CT scan of June 08, 2015.  FINDINGS: Bony calvarium appears intact. The intraparenchymal hemorrhage seen in the right thalamus and basal ganglia on prior exam is significantly enlarged currently, measuring 4.6 x 3.4 cm. There is an increased amount of hemorrhage seen within the right lateral ventricle. Small amount of hemorrhage is also seen in the left posterior horn which is new since prior exam. There appears to be hemorrhage in the third ventricle as well. Slightly increased left lateral ventricular dilatation is noted. There is approximately 6 mm of left-to-right midline shift which is increased compared to prior exam. Fourth ventricle appears normal.  IMPRESSION: Intraparenchymal hemorrhage seen in right thalamus and basal ganglia on prior exam is significantly enlarged, with increased amount of hemorrhage seen within the right lateral ventricle. Small amount of hemorrhage is now noted in the left lateral and third ventricles, with increased dilatation of the left lateral ventricle. These results will be called to the ordering clinician or representative by the Radiologist Assistant, and communication documented in the PACS or zVision Dashboard.   Electronically Signed   By: Marijo Conception, M.D.   On: 06/09/2015 10:08   Ct Head Wo Contrast  06/08/2015   CLINICAL DATA:  Intracranial hemorrhage.  Sudden behavioral change.  EXAM: CT HEAD WITHOUT CONTRAST  TECHNIQUE: Contiguous axial images were obtained from the base of the skull through the vertex without intravenous contrast.  COMPARISON:  06/07/2015 and 06/05/2015  FINDINGS: The right thalamic hemorrhage has increased in size. It currently measures 3.2 x 2.3 cm and previously measured 2.3 x 2.2 cm. There is mildly worsened mass effect on the third ventricle. There is slight increase in volume of intraventricular blood within the right occipital horn.  No other significant interval changes are evident. No extra-axial blood is evident.   IMPRESSION: Increased size of the right thalamic hemorrhage with worsened mass effect on the third ventricle. These results were called by telephone at the time of interpretation on 06/08/2015 at 2:41 am to the patient's nurse, who verbally acknowledged these results.   Electronically Signed   By: Andreas Newport M.D.   On: 06/08/2015 02:41   Ct Head Wo Contrast  06/07/2015   CLINICAL DATA:  59 year old with intracranial hemorrhage. Follow-up. Subsequent encounter.  EXAM: CT HEAD WITHOUT CONTRAST  TECHNIQUE: Contiguous axial images were obtained from the base of the skull through the vertex without intravenous contrast.  COMPARISON:  06/04/2013.  FINDINGS: Increase in size of right thalamic/posterior right internal capsule hematoma now with maximal transverse dimension 2.3 x 2.2 cm versus prior 2.1 x 1.6 cm. Increase surrounding vasogenic edema and local mass effect with mild compression the right lateral ventricle without evidence of midline shift.  Interval development of blood within the deep dependent aspect of the right lateral ventricle consistent with breakthrough of the right thalamic hemorrhage  into the right lateral ventricle.  No CT evidence of large acute thrombotic infarct.  No intracranial mass lesion separate from above described findings.  IMPRESSION: Increase in size of right thalamic/posterior right internal capsule hematoma now with maximal transverse dimension 2.3 x 2.2 cm versus prior 2.1 x 1.6 cm. Increase surrounding vasogenic edema and local mass effect with mild compression the right lateral ventricle without evidence of midline shift.  Interval development of blood within the deep dependent aspect of the right lateral ventricle consistent with breakthrough of the right thalamic hemorrhage into the right lateral ventricle.  These results were called by telephone at the time of interpretation on 06/07/2015 at 7:31 am to [facility], patient's nurse who verbally acknowledged these results.    Electronically Signed   By: Genia Del M.D.   On: 06/07/2015 07:31   Ct Head Wo Contrast  06/07/2015   CLINICAL DATA:  Status post fall last night. Patient diagnosed with intracranial hemorrhage 06/05/2015.  EXAM: CT HEAD WITHOUT CONTRAST  TECHNIQUE: Contiguous axial images were obtained from the base of the skull through the vertex without intravenous contrast.  COMPARISON:  Head CT scan 06/05/2015.  FINDINGS: Hemorrhage in the right thalamus is again seen. There is also a small amount of intraventricular blood. The appearance is unchanged. No new abnormality including new hemorrhage, infarct, mass lesion, midline shift or abnormal extra-axial fluid collection is identified. No hydrocephalus or pneumocephalus. The calvarium is intact.  IMPRESSION: Right for thalamic hemorrhage with a small amount of intraventricular blood appears unchanged. No new abnormality is seen.   Electronically Signed   By: Inge Rise M.D.   On: 06/07/2015 07:27   Ct Head Wo Contrast  06/05/2015   CLINICAL DATA:  59 year old male with left-sided weakness. Code stroke.  EXAM: CT HEAD WITHOUT CONTRAST  TECHNIQUE: Contiguous axial images were obtained from the base of the skull through the vertex without intravenous contrast.  COMPARISON:  None.  FINDINGS: There is a 2.1 x 1.6 cm acute intraparenchymal hemorrhage centered at the right thalamus. There is mild adjacent edema. No significant mass effect. No midline shift.  The ventricles and the sulci are appropriate in size for the patient's age. The The gray-white matter differentiation is preserved.  The visualized paranasal sinuses and mastoid air cells are well aerated. The calvarium is intact.  IMPRESSION: Acute right thalamic hemorrhage.  Critical Value/emergent results were called by telephone at the time of interpretation on 06/05/2015 at 10:39 pm to Dr. Janann Colonel , who verbally acknowledged these results.   Electronically Signed   By: Anner Crete M.D.   On: 06/05/2015  22:41   Ct Angio Chest Pe W/cm &/or Wo Cm  06/19/2015   CLINICAL DATA:  Shortness of Breath  EXAM: CT ANGIOGRAPHY CHEST WITH CONTRAST  TECHNIQUE: Multidetector CT imaging of the chest was performed using the standard protocol during bolus administration of intravenous contrast. Multiplanar CT image reconstructions and MIPs were obtained to evaluate the vascular anatomy.  CONTRAST:  91mL OMNIPAQUE IOHEXOL 350 MG/ML SOLN  COMPARISON:  Chest radiograph June 19, 2015  FINDINGS: There is no demonstrable pulmonary embolus. There is atherosclerotic change in the aortic arch region. There is no thoracic aortic aneurysm or dissection. The visualized great vessels appear unremarkable.  There is patchy bibasilar atelectatic change. There is no frank edema or consolidation. There is a small bulla in the right upper lobe peripherally near the apex.  On axial slice 37 series 6, there is a 7 mm nodular opacity along the  minor fissure laterally on the right.  Thyroid appears normal. There is no appreciable thoracic adenopathy.  There are foci of coronary artery calcification. There is left ventricular hypertrophy. The pericardium is not thickened.  In the visualized upper abdomen, there are several scattered subcentimeter liver cysts. The liver contour appears rather nodular. There is a feeding tube extending into the stomach ; tip is not seen an is below the level of imaging.  There are no blastic or lytic bone lesions.  Review of the MIP images confirms the above findings.  IMPRESSION: No demonstrable pulmonary embolus.  Patchy bibasilar atelectasis.  No frank edema or consolidation.  7 mm nodular opacity along the minor fissure on the right laterally. Followup of this nodular opacity should be based on Fleischner Society guidelines. If the patient is at high risk for bronchogenic carcinoma, follow-up chest CT at 3-12months is recommended. If the patient is at low risk for bronchogenic carcinoma, follow-up chest CT at 6-12  months is recommended. This recommendation follows the consensus statement: Guidelines for Management of Small Pulmonary Nodules Detected on CT Scans: A Statement from the [employer] as published in Radiology 2005; 237:395-400.  No appreciable adenopathy.  Foci of coronary artery calcification. There is left ventricular hypertrophy.  Liver contour appears rather nodular. Question a degree of underlying hepatic cirrhosis.   Electronically Signed   By: Lowella Grip III M.D.   On: 06/19/2015 15:30   Dg Chest Port 1 View  06/19/2015   CLINICAL DATA:  Respiratory distress.  EXAM: PORTABLE CHEST - 1 VIEW  COMPARISON:  One-view chest 06/18/2015  FINDINGS: The heart size is exaggerated by low lung volumes. Mild pulmonary vascular congestion is again noted. Minimal right basilar atelectasis is present. The left IJ line has been removed. There is no pneumothorax. A feeding tube courses off the inferior border of the film.  IMPRESSION: 1. Persistent low lung volumes exaggerating the heart size. 2. Mild bibasilar atelectasis is stable. 3. Interval removal of left IJ line without complication.   Electronically Signed   By: San Morelle M.D.   On: 06/19/2015 11:44   Dg Chest Port 1 View  06/18/2015   CLINICAL DATA:  Subsequent evaluation airway aspiration  EXAM: PORTABLE CHEST - 1 VIEW  COMPARISON:  06/15/2015  FINDINGS: Endotracheal tube has been removed. Feeding tube is seen extending off the inferior edge of the film. No change left IJ line.  Mild cardiac enlargement. Vascular pattern normal. Mild airspace disease in the medial lung bases bilaterally.  IMPRESSION: Mild bibasilar medial airspace disease most consistent with atelectasis.   Electronically Signed   By: Skipper Cliche M.D.   On: 06/18/2015 11:52   Dg Chest Port 1 View  06/15/2015   CLINICAL DATA:  Acute respiratory failure, hypoxia, intercerebral hemorrhage, alcoholic dependence and withdrawal  EXAM: PORTABLE CHEST - 1 VIEW  COMPARISON:   Chest x-ray of June 14, 2015  FINDINGS: The lungs are well-expanded and clear. The heart and pulmonary vascularity are normal. The mediastinum is normal in width. The endotracheal tube tip lies 4.7 cm above the carina. The esophagogastric tube tip projects below the inferior margin of the image. The left internal jugular venous catheter tip projects over the midportion of the SVC. The bony thorax is unremarkable.  IMPRESSION: There is no active cardiopulmonary disease.   Electronically Signed   By: David  Martinique M.D.   On: 06/15/2015 07:35   Dg Chest Port 1 View  06/14/2015   CLINICAL DATA:  Acute respiratory  failure with hypoxia  EXAM: PORTABLE CHEST - 1 VIEW  COMPARISON:  Radiograph 06/12/2015  FINDINGS: Endotracheal tube and NG tube are unchanged. LEFT central venous line unchanged. Normal cardiac silhouette. No pulmonary edema. No infiltrate. No pneumothorax.  IMPRESSION: 1. Stable support apparatus. 2. No interval change.   Electronically Signed   By: Suzy Bouchard M.D.   On: 06/14/2015 09:49   Dg Chest Port 1 View  06/12/2015   CLINICAL DATA:  Intubated patient, history of intracranial hemorrhage, alcohol withdrawal, acute respiratory failure and hypoxia.  EXAM: PORTABLE CHEST - 1 VIEW  COMPARISON:  Portable chest x-ray of June 11, 2015  FINDINGS: The right lung is adequately inflated and clear. On the left the retrocardiac region remains dense. There remains partial obscuration of the hemidiaphragm. The heart is normal in size. The pulmonary vascularity is not engorged. The endotracheal tube tip lies 3.8 cm above the carina. The esophagogastric tube tip projects below the inferior margin of the image. The left internal jugular venous catheter tip projects over the midportion of the SVC.  IMPRESSION: Stable appearance of the chest since yesterday's study. There is persistent left lower lobe atelectasis. The support tubes are in reasonable position.   Electronically Signed   By: David  Martinique M.D.    On: 06/12/2015 07:56   Dg Chest Port 1 View  06/11/2015   CLINICAL DATA:  Atelectasis described on previous chest x-ray report. Clinical data from recent chest x-ray indicating acute respiratory failure with hypoxia and alcohol withdrawal.  EXAM: PORTABLE CHEST - 1 VIEW  COMPARISON:  Chest x-ray dated 06/10/2015.  FINDINGS: Lungs are again hyperexpanded suggesting COPD. Dense opacity at the left lung base is unchanged. Lungs otherwise clear. No pleural effusion visualized. No pneumothorax.  Endotracheal tube remains well positioned with tip just above the level of the carina. Nasogastric tube passes below the diaphragm. A left-sided internal jugular central line appears adequately positioned with tip in the superior vena cava.  IMPRESSION: 1. Dense opacity at the left lung base without significant interval change. This is compatible with previous chest x-ray report description of atelectasis. If febrile, pneumonia could not be completely excluded. 2. Lungs again hyperexpanded suggesting some degree of COPD. 3. No new lung findings. 4. Endotracheal tube and nasogastric tube appear well positioned.   Electronically Signed   By: Franki Cabot M.D.   On: 06/11/2015 08:10   Dg Chest Port 1 View  06/10/2015   CLINICAL DATA:  Intubated patient, acute respiratory failure with hypoxia, alcohol withdrawal  EXAM: PORTABLE CHEST - 1 VIEW  COMPARISON:  Portable chest x-ray of June 08, 2014  FINDINGS: The lungs remain hyperinflated. There remain coarse infrahilar lung markings bilaterally consistent with subsegmental atelectasis. There is no pneumothorax or significant pleural effusion. The cardiac silhouette is normal in size. The pulmonary vascularity is not engorged.  The endotracheal tube tip lies 4 cm above the carina. The esophagogastric tube tip projects below the inferior margin of the image. The left internal jugular venous catheter tip projects over the midportion of the SVC  IMPRESSION: 1. COPD. Improving left  lower lobe atelectasis. There is no CHF or pneumonia. 2. The support tubes are in reasonable position.   Electronically Signed   By: David  Martinique M.D.   On: 06/10/2015 08:02   Dg Chest Port 1 View  06/09/2015   CLINICAL DATA:  Hypoxia  EXAM: PORTABLE CHEST - 1 VIEW  COMPARISON:  June 08, 2015  FINDINGS: Endotracheal tube tip is 5.2 cm above  the carina. Central catheter tip is in the superior vena cava. No pneumothorax. There is consolidation in the left lower lobe. Small left effusion. The lungs are otherwise clear. Heart size and pulmonary vascularity are normal. No adenopathy. No bone lesions.  IMPRESSION: Tube and catheter positions as described without pneumothorax. Consolidation left lower lobe with small left effusion.   Electronically Signed   By: Lowella Grip III M.D.   On: 06/09/2015 13:29   Dg Chest Port 1 View  06/08/2015   CLINICAL DATA:  Tobacco abuse.  Intracranial hemorrhage.  EXAM: PORTABLE CHEST - 1 VIEW  COMPARISON:  None.  FINDINGS: The heart size is exaggerated by low lung volumes. Mild pulmonary vascular congestion is evident. No focal airspace disease is present. The radiopaque line is noted over the left neck. I spoke with the patient's nurse. The patient does not have intravenous ine in the left neck. This may be a peripheral IV line projecting over the patient. The visualized soft tissues and bony thorax are unremarkable otherwise.  IMPRESSION: Low lung volumes and mild pulmonary vascular congestion.   Electronically Signed   By: San Morelle M.D.   On: 06/08/2015 07:47   Dg Abd Portable 1v  06/18/2015   CLINICAL DATA:  Feeding tube replacement.  EXAM: PORTABLE ABDOMEN - 1 VIEW  COMPARISON:  One-view abdomen 06/17/2015.  FINDINGS: A single view the abdomen demonstrates the tip the small bore feeding tube in the distal stomach. The bowel gas pattern is otherwise unremarkable. The lung bases are clear.  IMPRESSION: The tip of a small bore feeding tube is in the distal  stomach.   Electronically Signed   By: San Morelle M.D.   On: 06/18/2015 07:21   Dg Abd Portable 1v  06/17/2015   CLINICAL DATA:  Enteric tube placement.  EXAM: PORTABLE ABDOMEN - 1 VIEW  COMPARISON:  Earlier 06/17/2015.  FINDINGS: Exam demonstrates an enteric feeding tube with tip over the stomach in the left mid abdomen. Bowel gas pattern is nonobstructive. Remainder of the exam is unchanged.  IMPRESSION: No acute findings.  Enteric tube with tip over the stomach in the left mid abdomen.   Electronically Signed   By: Marin Olp M.D.   On: 06/17/2015 16:42   Dg Abd Portable 1v  06/17/2015   CLINICAL DATA:  Feeding tube placement  EXAM: PORTABLE ABDOMEN - 1 VIEW  COMPARISON:  June 09, 2015  FINDINGS: Feeding tube tip is in the stomach. Bowel gas pattern unremarkable. No obstruction or free air is seen on this supine examination. Lung bases are clear.  IMPRESSION: Feeding tube tip in stomach.  Bowel gas pattern unremarkable.   Electronically Signed   By: Lowella Grip III M.D.   On: 06/17/2015 12:36   Dg Abd Portable 1v  06/09/2015   CLINICAL DATA:  Orogastric tube placement  EXAM: PORTABLE ABDOMEN - 1 VIEW  COMPARISON:  None.  FINDINGS: Orogastric tube tip is in the proximal stomach. The side port is at the gastroesophageal junction. There is contrast in the distal half of the colon. There is no bowel dilatation or air-fluid level suggesting obstruction. No free air is seen on this supine examination.  IMPRESSION: Orogastric tube tip in proximal stomach. Side-port is at the gastroesophageal junction. Advise advancing tube approximately 6 cm to confirm that tube tip and side port are both well within the stomach. Overall bowel gas pattern unremarkable.   Electronically Signed   By: Lowella Grip III M.D.   On: 06/09/2015  16:34   Dg Loyce Dys Tube Plc W/fl-no Rad  06/24/2015   CLINICAL DATA:    NASO G TUBE PLACEMENT WITH FLUORO  Fluoroscopy was utilized by the requesting physician.  No  radiographic  interpretation.    Dg Swallowing Func-speech Pathology  06/06/2015    Objective Swallowing Evaluation: Modified Barium Swallow Study    Patient Details  Name: Sherley Leser MRN: [id number] Date of Birth: May 05, 1956  Today's Date: 06/06/2015 Time: SLP Start Time (ACUTE ONLY): 1245-SLP Stop Time (ACUTE ONLY): 1306 SLP Time Calculation (min) (ACUTE ONLY): 21 min  Past Medical History: No past medical history on file. Past Surgical History: No past surgical history on file. HPI:  Other Pertinent Information: Jden Want is an 59 y.o. male with no reported  past medical history (hasn't been to doctors in years) presents with acute  onset of slurred speech and left sided weakness. LSW 2130 when he noted he  was unable to stand or use his left side. EMS called, upon arrival noted  BP of 240/140. Code stroke activated. CT head imaging reviewed, shows  right sided thalamic/BG ICH. Denies taking any blood thinners  No Data Recorded  Assessment / Plan / Recommendation CHL IP CLINICAL IMPRESSIONS 06/06/2015  Therapy Diagnosis Mild to Moderate pharyngeal phase dysphagia;Moderate  oral phase dysphagia;  Clinical Impression Pt exhibiting moderate oral pharyngeal dysphagia which  is exacerbated by his impulsivity and agitation. Silent aspiration noted  when patient rapidly consumed multiple consecutive cup sips of thin  liquids. Trace aspirates remained below true vocal cords despite cueing  for voilitional cough. No penetration or aspiration noted in further  trials of thin liquid by cup and straw when sip size and rate where better  controlled. Pts poor condition of dentition along with residual  left  sided decreased lingual and labial sensation, decreased left sided   Lingual and labial range of motion prohibit effective mastication of solid  POs. Recommend dysphagia 2 diet and thin liquids with strict aspiration  precautions. Medicines whole with thin liquid. Full supervision  recommended for adequate carry over of  strategies. ST follow up warranted.         CHL IP TREATMENT RECOMMENDATION 06/06/2015  Treatment Recommendations Therapy as outlined in treatment plan below     CHL IP DIET RECOMMENDATION 06/06/2015  SLP Diet Recommendations Dysphagia 2 (Fine chop);Thin  Liquid Administration via (None)  Medication Administration Whole meds with liquid  Compensations Minimize environmental distractions;Slow rate;Small  sips/bites;Check for pocketing  Postural Changes and/or Swallow Maneuvers (None)     CHL IP OTHER RECOMMENDATIONS 06/06/2015  Recommended Consults (None)  Oral Care Recommendations Oral care BID  Other Recommendations (None)     No flowsheet data found.   CHL IP FREQUENCY AND DURATION 06/06/2015  Speech Therapy Frequency (ACUTE ONLY) min 2x/week  Treatment Duration 2 weeks     Pertinent Vitals/Pain        Arvil Chaco MA, CCC-SLP Acute Care Speech Language Pathologist       Levi Aland 06/06/2015, 1:44 PM     Microbiology: Recent Results (from the past 240 hour(s))  MRSA PCR Screening     Status: None   Collection Time: 06/29/15  6:02 AM  Result Value Ref Range Status   MRSA by PCR NEGATIVE NEGATIVE Final    Comment:        The GeneXpert MRSA Assay (FDA approved for NASAL specimens only), is one component of a comprehensive MRSA colonization surveillance program. It is not intended to diagnose  MRSA infection nor to guide or monitor treatment for MRSA infections.      Labs: Basic Metabolic Panel:  Recent Labs Lab 06/29/15 0315 07/01/15 0535 07/02/15 0519  NA 135 136 138  K 3.7 3.7 3.9  CL 103 104 107  CO2 22 25 24   GLUCOSE 94 152* 149*  BUN 12 18 17   CREATININE 0.67 0.63 0.57*  CALCIUM 8.5* 8.1* 8.4*   Liver Function Tests:  Recent Labs Lab 06/29/15 0315  AST 96*  ALT 174*  ALKPHOS 75  BILITOT 1.0  PROT 6.5  ALBUMIN 3.1*   No results for input(s): LIPASE, AMYLASE in the last 168 hours. No results for input(s): AMMONIA in the last 168 hours. CBC:  Recent  Labs Lab 06/28/15 1002 06/29/15 0315 06/30/15 0233 07/01/15 0535 07/02/15 0519 07/03/15 0306  WBC 9.5 11.1* 14.5* 10.7* 6.2 5.5  NEUTROABS 7.9*  --   --   --   --   --   HGB 14.1 13.4 13.4 10.0* 9.8* 9.1*  HCT 39.9 38.6* 38.7* 29.2* 28.5* 27.1*  MCV 91.5 92.3 92.6 93.6 94.4 94.8  PLT 82* 86* 116* 79* 72* 82*   Cardiac Enzymes: No results for input(s): CKTOTAL, CKMB, CKMBINDEX, TROPONINI in the last 168 hours. BNP: BNP (last 3 results)  Recent Labs  06/19/15 1415  BNP 349.3*    ProBNP (last 3 results) No results for input(s): PROBNP in the last 8760 hours.  CBG:  Recent Labs Lab 07/02/15 2005 07/03/15 0035 07/03/15 0349 07/03/15 0737 07/03/15 1140  GLUCAP 126* 144* 128* 138* 113*       Signed:  Merrick Feutz  [facility] 07/03/2015, 1:48 PM

## 2015-07-03 NOTE — Clinical Social Work Note (Signed)
Patient to be d/c'ed today to [facility].  Patient and family agreeable to plans will transport via ems RN to call report.  Evette Cristal, MSW, [place]

## 2015-07-03 NOTE — Progress Notes (Signed)
Pt discharged to [place].  Report called and given to [facility].

## 2016-03-01 DIAGNOSIS — G8190 Hemiplegia, unspecified affecting unspecified side: Secondary | ICD-10-CM

## 2016-03-01 HISTORY — DX: Hemiplegia, unspecified affecting unspecified side: G81.90

## 2016-03-02 ENCOUNTER — Other Ambulatory Visit (HOSPITAL_COMMUNITY): Payer: Self-pay | Admitting: Nurse Practitioner

## 2016-03-02 DIAGNOSIS — B1920 Unspecified viral hepatitis C without hepatic coma: Secondary | ICD-10-CM

## 2016-03-22 ENCOUNTER — Ambulatory Visit (HOSPITAL_COMMUNITY)
Admission: RE | Admit: 2016-03-22 | Discharge: 2016-03-22 | Disposition: A | Discharge status: Home / Self Care | Payer: Medicaid Other | Source: Ambulatory Visit | Attending: Nurse Practitioner | Admitting: Nurse Practitioner

## 2016-03-22 DIAGNOSIS — B1920 Unspecified viral hepatitis C without hepatic coma: Secondary | ICD-10-CM | POA: Diagnosis not present

## 2016-03-22 DIAGNOSIS — K7460 Unspecified cirrhosis of liver: Secondary | ICD-10-CM | POA: Diagnosis not present

## 2016-10-25 ENCOUNTER — Other Ambulatory Visit: Payer: Self-pay | Admitting: Nurse Practitioner

## 2016-10-25 DIAGNOSIS — K7469 Other cirrhosis of liver: Secondary | ICD-10-CM

## 2016-10-27 ENCOUNTER — Encounter: Payer: Self-pay | Admitting: Physician Assistant

## 2016-11-04 ENCOUNTER — Encounter: Payer: Self-pay | Admitting: Physician Assistant

## 2016-11-04 ENCOUNTER — Ambulatory Visit (INDEPENDENT_AMBULATORY_CARE_PROVIDER_SITE_OTHER): Payer: Medicaid Other | Admitting: Physician Assistant

## 2016-11-04 VITALS — BP 92/64 | HR 78 | Ht 72.0 in

## 2016-11-04 DIAGNOSIS — K7030 Alcoholic cirrhosis of liver without ascites: Secondary | ICD-10-CM | POA: Diagnosis not present

## 2016-11-04 DIAGNOSIS — B182 Chronic viral hepatitis C: Secondary | ICD-10-CM

## 2016-11-04 DIAGNOSIS — Z1211 Encounter for screening for malignant neoplasm of colon: Secondary | ICD-10-CM | POA: Diagnosis not present

## 2016-11-04 DIAGNOSIS — Z1212 Encounter for screening for malignant neoplasm of rectum: Secondary | ICD-10-CM

## 2016-11-04 MED ORDER — NA SULFATE-K SULFATE-MG SULF 17.5-3.13-1.6 GM/177ML PO SOLN: 1.0000 | ORAL | 0 refills | Status: DC

## 2016-11-04 NOTE — Progress Notes (Addendum)
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   According to referring physicians last note patient was seen on 10/25/16 and has a history of hepatitis C genotype 1A, treatment was previously tried with interferon and patient had a wide range of side effects and this was stopped. At time of last visit patient was referred to our office for a screening EGD.   Today, the patient is found in a wheelchair, with his caregiver by his side from [facility] rehabilitation, he tells me that he has no current complaints but was told that he needed to come to our office for an EGD. He tells me that he has followed with Dawn at the liver clinic for a while now and she is trying to get him started on some medicine for his hepatitis C. He denies any acute problems including dysphagia.   Patient denies having a previous screening colonoscopy.   Patient denies fever, chills, blood in the stool, melena, change in bowel habits, weight loss, nausea, vomiting or abdominal pain.  Past Medical History: Cirrhosis, Hepatitis C, Anxiety, CHF, intracerebral hemorrhage, spastic paralysis, contractures of left side  Past Surgical History:  Procedure Laterality Date  . LAPAROTOMY N/A 06/28/2015   Procedure: EXPLORATORY LAPAROTOMY;  Surgeon: Georganna Skeans, MD;  Location: [place];  Service: General;  Laterality: N/A;  . PEG PLACEMENT N/A 06/25/2015   Procedure: PERCUTANEOUS ENDOSCOPIC GASTROSTOMY (PEG) PLACEMENT;  Surgeon: Georganna Skeans, MD;  Location: [place];  Service: General;  Laterality: N/A;  . PEG PLACEMENT N/A 06/28/2015   Procedure: PERCUTANEOUS ENDOSCOPIC GASTROSTOMY (PEG) PLACEMENT;  Surgeon:  Georganna Skeans, MD;  Location: [place];  Service: General;  Laterality: N/A;    Current Outpatient Prescriptions  Medication Sig Dispense Refill  . acetaminophen (TYLENOL) 160 MG/5ML solution Place 20.3 mLs (650 mg total) into feeding tube every 4 (four) hours as needed for fever (temp >99). 120 mL 0  . Amino Acids-Protein Hydrolys (FEEDING SUPPLEMENT, PRO-STAT SUGAR FREE 64,) LIQD Place 30 mLs into feeding tube daily. 900 mL 0  . amLODipine (NORVASC) 10 MG tablet Take 1 tablet (10 mg total) by mouth daily. 30 tablet 1  . antiseptic oral rinse (CPC / CETYLPYRIDINIUM CHLORIDE 0.05%) 0.05 % LIQD solution 7 mLs by Mouth Rinse route 2 times daily at 12 noon and 4 pm. 60 mL 1  . bisacodyl (DULCOLAX) 10 MG suppository Place 1 suppository (10 mg total) rectally daily as needed for moderate constipation. 12 suppository 0  . cloNIDine (CATAPRES) 0.2 MG tablet Take 1 tablet (0.2 mg total) by mouth 3 (three) times daily. [id number] tablet 3  . folic acid (FOLVITE) 1 MG tablet Take 1 tablet (1 mg total) by mouth daily. 30 tablet 1  . hydrALAZINE (APRESOLINE) 100 MG tablet Place 1 tablet (100 mg total) into feeding tube every 8 (eight) hours. 100 tablet 2  . insulin aspart (NOVOLOG) 100 UNIT/ML injection Inject 2-6 Units into the skin every 4 (four) hours. 10 mL 11  . labetalol (NORMODYNE) 300 MG tablet Take 1 tablet (300 mg total) by mouth 3 (three) times daily. 100 tablet 2  . Multiple Vitamin (MULTIVITAMIN) LIQD Place 5 mLs into feeding tube  daily. 60 mL 3  . Nutritional Supplements (FEEDING SUPPLEMENT, JEVITY 1.5 CAL/FIBER,) LIQD Place 1,000 mLs into feeding tube continuous.    Marland Kitchen thiamine 100 MG tablet Take 1 tablet (100 mg total) by mouth daily. 30 tablet 2  . Water For Irrigation, Sterile (FREE WATER) SOLN Place 200 mLs into feeding tube every 8 (eight) hours.     No current facility-administered medications for this visit.     Allergies as of 11/04/2016  . (No Known Allergies)    Family History  Problem  Relation Age of Onset  . Colon cancer Neg Hx   . Stomach cancer Neg Hx   . Rectal cancer Neg Hx   . Liver cancer Neg Hx   . Esophageal cancer Neg Hx     Social History   Social History  . Marital status: Single    Spouse name: N/A  . Number of children: 2  . Years of education: N/A   Occupational History  . disabled    Social History Main Topics  . Smoking status: Never Smoker  . Smokeless tobacco: Never Used  . Alcohol use No     Comment: Patient is non verbal at this time  . Drug use: No     Comment: Unable to assess as patient is non verbal at this time.  Marland Kitchen Sexual activity: Not on file     Comment: Patient non verbal at this time.   Other Topics Concern  . Not on file   Social History Narrative  . No narrative on file    Review of Systems:     Constitutional: No weight loss, fever or chills Cardiovascular: No chest pain Respiratory: No SOB  Gastrointestinal: See HPI and otherwise negative   Physical Exam:  Vital signs: BP 92/64   Pulse 78   Ht 6' (1.829 m)    Constitutional: Caucasian male appears to be in NAD, alert and cooperative, in wheelchair Respiratory: Respirations even and unlabored. Lungs clear to auscultation bilaterally.   No wheezes, crackles, or rhonchi.  Cardiovascular: Normal S1, S2. No MRG. Regular rate and rhythm. No peripheral edema, cyanosis or pallor.  Gastrointestinal:  Soft, nondistended, nontender. No rebound or guarding. Normal bowel sounds. No appreciable masses or hepatomegaly. Rectal:  Not performed.  Msk: Contractures of the left side including arms and legs Psychiatric:  Demonstrates good judgement and reason without abnormal affect or behaviors.  RELEVANT LABS AND IMAGING: CBC    Component Value Date/Time   WBC 5.5 07/03/2015 0306   RBC 2.86 (L) 07/03/2015 0306   HGB 9.1 (L) 07/03/2015 0306   HCT 27.1 (L) 07/03/2015 0306   PLT 82 (L) 07/03/2015 0306   MCV 94.8 07/03/2015 0306   MCH 31.8 07/03/2015 0306   MCHC 33.6  07/03/2015 0306   RDW 14.4 07/03/2015 0306   LYMPHSABS 1.0 06/28/2015 1002   MONOABS 0.5 06/28/2015 1002   EOSABS 0.1 06/28/2015 1002   BASOSABS 0.0 06/28/2015 1002    CMP     Component Value Date/Time   NA 138 07/02/2015 0519   K 3.9 07/02/2015 0519   CL 107 07/02/2015 0519   CO2 24 07/02/2015 0519   GLUCOSE 149 (H) 07/02/2015 0519   BUN 17 07/02/2015 0519   CREATININE 0.57 (L) 07/02/2015 0519   CALCIUM 8.4 (L) 07/02/2015 0519   PROT 6.5 06/29/2015 0315   ALBUMIN 3.1 (L) 06/29/2015 0315   AST 96 (H) 06/29/2015 0315   ALT 174 (H) 06/29/2015 0315   ALKPHOS 75 06/29/2015  0315   BILITOT 1.0 06/29/2015 0315   GFRNONAA >60 07/02/2015 0519   GFRAA >60 07/02/2015 0519    Assessment: 1. Cirrhosis: Per notes.related to alcohol use and hepatitis C, patient is following with the liver clinic in town for this but was referred to our clinic for a screening EGD for varices 2. Colorectal cancer screening: Patient is a 60 years old and has never had a screening colonoscopy  Plan: 1. Schedule EGD and colonoscopy with Dr. Hilarie Fredrickson, as he is supervising physician this afternoon, in the hospital due to patient's left-sided contractures and wheelchair use. Discussed risks, benefits, limitations and alternatives the patient agrees to proceed. 2. Patient should continue to follow with Roosevelt Locks, NP at the liver clinic for follow-up of his cirrhosis 3. Patient to follow in clinic per recommendations after time of procedures  Ellouise Newer, PA-C [facility] 11/04/2016, 2:18 PM  Cc: Roosevelt Locks [facility]L (Liver Clinic)  Addendum: Reviewed and agree with initial management. Jerene Bears, MD

## 2016-11-11 ENCOUNTER — Ambulatory Visit
Admission: RE | Admit: 2016-11-11 | Discharge: 2016-11-11 | Disposition: A | Discharge status: Home / Self Care | Payer: Medicaid Other | Source: Ambulatory Visit | Attending: Nurse Practitioner | Admitting: Nurse Practitioner

## 2016-11-11 DIAGNOSIS — K7469 Other cirrhosis of liver: Secondary | ICD-10-CM

## 2016-12-12 NOTE — Progress Notes (Signed)
Preop instructions for:                         Date of Birth                            Date of Procedure:  12/13/2016     Doctor: Ulice Dash Pyrtle Time to arrive at [facility]: 0700 Report to: Admitting  Procedure:  EGD/ Colonoscopy Any procedure time changes, MD office will notify you!   Do not eat or drink past midnight the night before your procedure.(To include any tube feedings-must be discontinued) Reminder:Follow bowel prep instructions per MD office!   Take these morning medications only with sips of water.(or give through gastrostomy or feeding tube).Amlodipine, citalopram, labetalol, valium, baclofen, clonidine, hydralazine.   Note: No Insulin or Diabetic meds should be given or taken the morning of the procedure!   Facility contact: [facility]                 Phone: [place]: No  Transportation contact phone#: [phone]  Please send day of procedure:current med list and meds last taken that day, confirm nothing by mouth status from what time, Patient Demographic info( to include DNR status, problem list, allergies)   RN contact name/phone#: Vic Ripper, or Danae Chen                            and Fax [employer] card and picture ID Leave all jewelry and other valuables at place where living( no metal or rings to be worn) No contact lens Women-no make-up, no lotions,perfumes,powders Men-no colognes,lotions  Any questions day of procedure,call Endoscopy [phone]!   Sent from :[facility]                   [place]                   [phone]  Sent by :RN

## 2016-12-13 ENCOUNTER — Encounter (HOSPITAL_COMMUNITY)
Admission: RE | Disposition: A | Discharge status: Skilled Nursing Facility | Payer: Self-pay | Source: Ambulatory Visit | Attending: Internal Medicine

## 2016-12-13 ENCOUNTER — Encounter (HOSPITAL_COMMUNITY): Payer: Self-pay

## 2016-12-13 ENCOUNTER — Ambulatory Visit (HOSPITAL_COMMUNITY)
Admission: RE | Admit: 2016-12-13 | Discharge: 2016-12-13 | Disposition: A | Discharge status: Skilled Nursing Facility | Payer: Medicaid Other | Source: Ambulatory Visit | Attending: Internal Medicine | Admitting: Internal Medicine

## 2016-12-13 ENCOUNTER — Ambulatory Visit (HOSPITAL_COMMUNITY): Payer: Medicaid Other | Admitting: Certified Registered Nurse Anesthetist

## 2016-12-13 DIAGNOSIS — I10 Essential (primary) hypertension: Secondary | ICD-10-CM | POA: Diagnosis not present

## 2016-12-13 DIAGNOSIS — I509 Heart failure, unspecified: Secondary | ICD-10-CM | POA: Diagnosis not present

## 2016-12-13 DIAGNOSIS — F329 Major depressive disorder, single episode, unspecified: Secondary | ICD-10-CM | POA: Insufficient documentation

## 2016-12-13 DIAGNOSIS — E119 Type 2 diabetes mellitus without complications: Secondary | ICD-10-CM | POA: Insufficient documentation

## 2016-12-13 DIAGNOSIS — Z1211 Encounter for screening for malignant neoplasm of colon: Secondary | ICD-10-CM | POA: Diagnosis not present

## 2016-12-13 DIAGNOSIS — Z1212 Encounter for screening for malignant neoplasm of rectum: Secondary | ICD-10-CM

## 2016-12-13 DIAGNOSIS — D128 Benign neoplasm of rectum: Secondary | ICD-10-CM | POA: Diagnosis not present

## 2016-12-13 DIAGNOSIS — B1920 Unspecified viral hepatitis C without hepatic coma: Secondary | ICD-10-CM | POA: Insufficient documentation

## 2016-12-13 DIAGNOSIS — G839 Paralytic syndrome, unspecified: Secondary | ICD-10-CM | POA: Insufficient documentation

## 2016-12-13 DIAGNOSIS — K7030 Alcoholic cirrhosis of liver without ascites: Secondary | ICD-10-CM | POA: Insufficient documentation

## 2016-12-13 DIAGNOSIS — F419 Anxiety disorder, unspecified: Secondary | ICD-10-CM | POA: Diagnosis not present

## 2016-12-13 DIAGNOSIS — Z8673 Personal history of transient ischemic attack (TIA), and cerebral infarction without residual deficits: Secondary | ICD-10-CM | POA: Insufficient documentation

## 2016-12-13 DIAGNOSIS — K7460 Unspecified cirrhosis of liver: Secondary | ICD-10-CM

## 2016-12-13 HISTORY — PX: COLONOSCOPY WITH PROPOFOL: SHX5780

## 2016-12-13 HISTORY — PX: ESOPHAGOGASTRODUODENOSCOPY (EGD) WITH PROPOFOL: SHX5813

## 2016-12-13 LAB — BASIC METABOLIC PANEL
Anion gap: 10 (ref 5–15)
BUN: 21 mg/dL — AB (ref 6–20)
CO2: 23 mmol/L (ref 22–32)
Calcium: 9.4 mg/dL (ref 8.9–10.3)
Chloride: 110 mmol/L (ref 101–111)
Creatinine, Ser: 0.87 mg/dL (ref 0.61–1.24)
GFR calc Af Amer: >60 mL/min (ref >60)
GLUCOSE: 125 mg/dL — AB (ref 65–99)
POTASSIUM: 4.4 mmol/L (ref 3.5–5.1)
Sodium: 143 mmol/L (ref 135–145)

## 2016-12-13 LAB — CBC
HEMATOCRIT: 42.6 % (ref 39.0–52.0)
Hemoglobin: 14.5 g/dL (ref 13.0–17.0)
MCH: 32.2 pg (ref 26.0–34.0)
MCHC: 34 g/dL (ref 30.0–36.0)
MCV: 94.5 fL (ref 78.0–100.0)
Platelets: 121 10*3/uL — ABNORMAL LOW (ref 150–400)
RBC: 4.51 MIL/uL (ref 4.22–5.81)
RDW: 13.3 % (ref 11.5–15.5)
WBC: 13.5 10*3/uL — ABNORMAL HIGH (ref 4.0–10.5)

## 2016-12-13 SURGERY — ESOPHAGOGASTRODUODENOSCOPY (EGD) WITH PROPOFOL
Anesthesia: Monitor Anesthesia Care

## 2016-12-13 MED ORDER — FENTANYL CITRATE (PF) 100 MCG/2ML IJ SOLN
INTRAMUSCULAR | Status: AC
  Filled 2016-12-13: qty 2

## 2016-12-13 MED ORDER — SODIUM CHLORIDE 0.9 % IV SOLN: INTRAVENOUS | Status: DC

## 2016-12-13 MED ORDER — LACTATED RINGERS IV SOLN
INTRAVENOUS | Status: DC | PRN
  Administered 2016-12-13: 08:00:00 via INTRAVENOUS

## 2016-12-13 MED ORDER — PHENYLEPHRINE HCL 10 MG/ML IJ SOLN
INTRAMUSCULAR | Status: DC | PRN
Start: 2016-12-13 — End: 2016-12-13
  Administered 2016-12-13: 80 ug via INTRAVENOUS
  Administered 2016-12-13 (×2): 40 ug via INTRAVENOUS

## 2016-12-13 MED ORDER — PROPOFOL 500 MG/50ML IV EMUL
INTRAVENOUS | Status: DC | PRN
Start: 2016-12-13 — End: 2016-12-13
  Administered 2016-12-13: 100 ug/kg/min via INTRAVENOUS

## 2016-12-13 MED ORDER — PROPOFOL 10 MG/ML IV BOLUS
INTRAVENOUS | Status: AC
  Filled 2016-12-13: qty 60

## 2016-12-13 MED ORDER — LIDOCAINE 2% (20 MG/ML) 5 ML SYRINGE
INTRAMUSCULAR | Status: AC
  Filled 2016-12-13: qty 5

## 2016-12-13 MED ORDER — FENTANYL CITRATE (PF) 100 MCG/2ML IJ SOLN
INTRAMUSCULAR | Status: DC | PRN
  Administered 2016-12-13 (×2): 50 ug via INTRAVENOUS

## 2016-12-13 MED ORDER — PROPOFOL 10 MG/ML IV BOLUS
INTRAVENOUS | Status: DC | PRN
  Administered 2016-12-13: 50 mg via INTRAVENOUS
  Administered 2016-12-13: 10 mg via INTRAVENOUS
  Administered 2016-12-13: 20 mg via INTRAVENOUS
  Administered 2016-12-13: 10 mg via INTRAVENOUS
  Administered 2016-12-13: 20 mg via INTRAVENOUS

## 2016-12-13 MED ORDER — LIDOCAINE 2% (20 MG/ML) 5 ML SYRINGE
INTRAMUSCULAR | Status: DC | PRN
  Administered 2016-12-13: 100 mg via INTRAVENOUS

## 2016-12-13 SURGICAL SUPPLY — 24 items

## 2016-12-13 NOTE — Anesthesia Postprocedure Evaluation (Addendum)
Anesthesia Post Note  Patient: Mike Rowe  Procedure(s) Performed: Procedure(s) (LRB): ESOPHAGOGASTRODUODENOSCOPY (EGD) WITH PROPOFOL (N/A) COLONOSCOPY WITH PROPOFOL (N/A)  Patient location during evaluation: PACU Anesthesia Type: MAC Level of consciousness: awake and alert Pain management: pain level controlled Vital Signs Assessment: post-procedure vital signs reviewed and stable Respiratory status: spontaneous breathing, nonlabored ventilation, respiratory function stable and patient connected to nasal cannula oxygen Cardiovascular status: stable and blood pressure returned to baseline Anesthetic complications: no       Last Vitals:  Vitals:   12/13/16 0811 12/13/16 1007  BP: 110/74 106/65  Pulse: 70 79  Resp: 10 14  Temp: 36.4 C     Last Pain:  Vitals:   12/13/16 0811  TempSrc: Oral                 Yazaira Speas S

## 2016-12-13 NOTE — H&P (Signed)
HPI: 61 yo male with hx of cirrhosis (Hep C/ETOH), CHF, spastic paralysis sent for consideration of variceal screening.  Followed by [facility].  Seen by Darrell Jewel, PA-C on 11/04/2016.  No changes since this visit.  Pt denies complaints.  Per RN colon prep completed, but patient found covered in brown stools on arrival to endo unit.  Past Medical History:  Diagnosis Date  . Anxiety   . Cerebral infarction ([place])   . Diabetes ([place])   . Dysphagia   . Hypertension   . Major depressive disorder     Past Surgical History:  Procedure Laterality Date  . LAPAROTOMY N/A 06/28/2015   Procedure: EXPLORATORY LAPAROTOMY;  Surgeon: Georganna Skeans, MD;  Location: [place];  Service: General;  Laterality: N/A;  . PEG PLACEMENT N/A 06/25/2015   Procedure: PERCUTANEOUS ENDOSCOPIC GASTROSTOMY (PEG) PLACEMENT;  Surgeon: Georganna Skeans, MD;  Location: [place];  Service: General;  Laterality: N/A;  . PEG PLACEMENT N/A 06/28/2015   Procedure: PERCUTANEOUS ENDOSCOPIC GASTROSTOMY (PEG) PLACEMENT;  Surgeon: Georganna Skeans, MD;  Location: [place];  Service: General;  Laterality: N/A;     (Not in an outpatient encounter)  No Known Allergies  Family History  Problem Relation Age of Onset  . Colon cancer Neg Hx   . Stomach cancer Neg Hx   . Rectal cancer Neg Hx   . Liver cancer Neg Hx   . Esophageal cancer Neg Hx     Social History  Substance Use Topics  . Smoking status: Never Smoker  . Smokeless tobacco: Never Used  . Alcohol use Yes    ROS: As per history of present illness, otherwise negative  BP 110/74   Pulse 70   Temp 97.6 F (36.4 C) (Oral)   Resp 10   Ht 6' (1.829 m)   Wt 149 lb (67.6 kg)   SpO2 97%   BMI 20.21 kg/m  Gen: awake, alert, debilitated and chronically ill appearing, NAD HEENT: anicteric, op clear CV: RRR, no mrg Pulm: CTA b/l Abd: soft, NT/ND, +BS throughout Ext: L contractures, without edemea Neuro: nonfocal   RELEVANT LABS AND IMAGING: CBC    Component Value  Date/Time   WBC 5.5 07/03/2015 0306   RBC 2.86 (L) 07/03/2015 0306   HGB 9.1 (L) 07/03/2015 0306   HCT 27.1 (L) 07/03/2015 0306   PLT 82 (L) 07/03/2015 0306   MCV 94.8 07/03/2015 0306   MCH 31.8 07/03/2015 0306   MCHC 33.6 07/03/2015 0306   RDW 14.4 07/03/2015 0306   LYMPHSABS 1.0 06/28/2015 1002   MONOABS 0.5 06/28/2015 1002   EOSABS 0.1 06/28/2015 1002   BASOSABS 0.0 06/28/2015 1002    CMP     Component Value Date/Time   NA 138 07/02/2015 0519   K 3.9 07/02/2015 0519   CL 107 07/02/2015 0519   CO2 24 07/02/2015 0519   GLUCOSE 149 (H) 07/02/2015 0519   BUN 17 07/02/2015 0519   CREATININE 0.57 (L) 07/02/2015 0519   CALCIUM 8.4 (L) 07/02/2015 0519   PROT 6.5 06/29/2015 0315   ALBUMIN 3.1 (L) 06/29/2015 0315   AST 96 (H) 06/29/2015 0315   ALT 174 (H) 06/29/2015 0315   ALKPHOS 75 06/29/2015 0315   BILITOT 1.0 06/29/2015 0315   GFRNONAA >60 07/02/2015 0519   GFRAA >60 07/02/2015 0519   BMP Latest Ref Rng & Units 12/13/2016 07/02/2015 07/01/2015  Glucose 65 - 99 mg/dL 125(H) 149(H) 152(H)  BUN 6 - 20 mg/dL 21(H) 17 18  Creatinine 0.61 - 1.24  mg/dL 0.87 0.57(L) 0.63  Sodium 135 - 145 mmol/L 143 138 136  Potassium 3.5 - 5.1 mmol/L 4.4 3.9 3.7  Chloride 101 - 111 mmol/L 110 107 104  CO2 22 - 32 mmol/L 23 24 25   Calcium 8.9 - 10.3 mg/dL 9.4 8.4(L) 8.1(L)   Lab Results  Component Value Date   INR 1.11 06/30/2015   INR 1.09 06/07/2015   INR 1.11 06/05/2015    ASSESSMENT/PLAN: 61 yo male with hx of cirrhosis (Hep C/ETOH), CHF, spastic paralysis sent for consideration of variceal screening.   1. Hep C and ETOH cirrhosis -- appropriate for variceal screening.  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  Following with liver clinic ([facility]).  HIGHER THAN BASELINE RISK.The nature of the procedure, as well as the risks, benefits, and alternatives were carefully and thoroughly reviewed with the patient. Ample time for discussion and questions allowed. The patient understood, was satisfied,  and agreed to proceed.  On labetalol, but if varices will need NSBB  2. CRC screening -- no hx of colonoscopy.  Prep may not be adequate.  Will attempt. The nature of the procedure, as well as the risks, benefits, and alternatives were carefully and thoroughly reviewed with the patient. Ample time for discussion and questions allowed. The patient understood, was satisfied, and agreed to proceed.

## 2016-12-13 NOTE — Anesthesia Preprocedure Evaluation (Signed)
Anesthesia Evaluation  Patient identified by MRN, date of birth, ID band Patient awake    Reviewed: Allergy & Precautions, NPO status , Patient's Chart, lab work & pertinent test results  Airway Mallampati: II  TM Distance: >3 FB Neck ROM: Full    Dental no notable dental hx.    Pulmonary neg pulmonary ROS,    Pulmonary exam normal breath sounds clear to auscultation       Cardiovascular hypertension, Normal cardiovascular exam Rhythm:Regular Rate:Normal     Neuro/Psych CVA negative psych ROS   GI/Hepatic negative GI ROS, (+)     substance abuse  cocaine use,   Endo/Other  diabetes  Renal/GU negative Renal ROS  negative genitourinary   Musculoskeletal negative musculoskeletal ROS (+)   Abdominal   Peds negative pediatric ROS (+)  Hematology negative hematology ROS (+)   Anesthesia Other Findings   Reproductive/Obstetrics negative OB ROS                             Anesthesia Physical Anesthesia Plan  ASA: III  Anesthesia Plan: MAC   Post-op Pain Management:    Induction: Intravenous  Airway Management Planned: Nasal Cannula  Additional Equipment:   Intra-op Plan:   Post-operative Plan:   Informed Consent: I have reviewed the patients History and Physical, chart, labs and discussed the procedure including the risks, benefits and alternatives for the proposed anesthesia with the patient or authorized representative who has indicated his/her understanding and acceptance.   Dental advisory given  Plan Discussed with: CRNA and Surgeon  Anesthesia Plan Comments:         Anesthesia Quick Evaluation

## 2016-12-13 NOTE — Transfer of Care (Signed)
Immediate Anesthesia Transfer of Care Note  Patient: Mike Rowe  Procedure(s) Performed: Procedure(s): ESOPHAGOGASTRODUODENOSCOPY (EGD) WITH PROPOFOL (N/A) COLONOSCOPY WITH PROPOFOL (N/A)  Patient Location: PACU  Anesthesia Type:MAC  Level of Consciousness: Patient easily awoken, sedated, comfortable, cooperative, following commands, responds to stimulation.   Airway & Oxygen Therapy: Patient spontaneously breathing, ventilating well, oxygen via simple oxygen mask.  Post-op Assessment: Report given to PACU RN, vital signs reviewed and stable, moving all extremities.   Post vital signs: Reviewed and stable.  Complications: No apparent anesthesia complications  Last Vitals:  Vitals:   12/13/16 0811  BP: 110/74  Pulse: 70  Resp: 10  Temp: 36.4 C    Last Pain:  Vitals:   12/13/16 0811  TempSrc: Oral         Complications: No apparent anesthesia complications

## 2016-12-13 NOTE — Op Note (Signed)
[facility] Patient Name: Mike Rowe Procedure Date: 12/13/2016 MRN: [id number] Attending MD: Jerene Bears , MD Date of Birth: May 21, 1956 CSN: [id number] Age: 61 Admit Type: Outpatient Procedure:                Colonoscopy Indications:              Screening for colorectal malignant neoplasm, This                            is the patient's first colonoscopy Providers:                Lajuan Lines. Hilarie Fredrickson, MD, Hilma Favors, RN, Heide Scales,                            CRNA, Elspeth Cho Tech., Technician Referring MD:             Roosevelt Locks, NP with [facility] Medicines:                Monitored Anesthesia Care Complications:            No immediate complications. Estimated Blood Loss:     Estimated blood loss was minimal. Procedure:                Pre-Anesthesia Assessment:                           - Prior to the procedure, a History and Physical                            was performed, and patient medications and                            allergies were reviewed. The patient's tolerance of                            previous anesthesia was also reviewed. The risks                            and benefits of the procedure and the sedation                            options and risks were discussed with the patient.                            All questions were answered, and informed consent                            was obtained. Prior Anticoagulants: The patient has                            taken no previous anticoagulant or antiplatelet                            agents. ASA Grade Assessment: III - A patient with  severe systemic disease. After reviewing the risks                            and benefits, the patient was deemed in                            satisfactory condition to undergo the procedure.                           After obtaining informed consent, the colonoscope                            was passed under direct  vision. Throughout the                            procedure, the patient's blood pressure, pulse, and                            oxygen saturations were monitored continuously. The                            [id number]) scope was introduced through                            the anus and advanced to the the cecum, identified                            by appendiceal orifice and ileocecal valve. The                            colonoscopy was performed with difficulty due to                            poor bowel prep with stool present. Successful                            completion of the procedure was aided by lavage.                            The patient tolerated the procedure well. The                            quality of the bowel preparation was poor. The                            ileocecal valve, appendiceal orifice, and rectum                            were photographed. Scope In: 9:17:54 AM Scope Out: 9:55:30 AM Scope Withdrawal Time: 0 hours 18 minutes 56 seconds  Total Procedure Duration: 0 hours 37 minutes 36 seconds  Findings:      The digital rectal exam was normal.      A large amount of stool with vegetable material was found  in the entire       colon, interfering with visualization. Lavage of the area was performed       using copious amounts of sterile water, resulting in incomplete       clearance with fair visualization.      Two sessile polyps were found in the rectum. The polyps were 4 to 5 mm       in size. These polyps were removed with a cold snare. Resection and       retrieval were complete.      The exam was otherwise without abnormality, again limited by preparation       (the procedure was not aborted due to the patient's likely inability.      The retroflexed view of the distal rectum and anal verge was normal and       showed no anal or rectal abnormalities. Impression:               - Preparation of the colon was poor. Severe limited                             exam for small and medium-sized polyps. No masses                            seen.                           - Stool in the entire examined colon.                           - Two 4 to 5 mm polyps in the rectum, removed with                            a cold snare. Resected and retrieved.                           - The examination was otherwise normal with                            limitations as above.                           - The distal rectum and anal verge are normal on                            retroflexion view. Moderate Sedation:      N/A Recommendation:           - Patient has a contact number available for                            emergencies. The signs and symptoms of potential                            delayed complications were discussed with the                            patient. Return to normal activities tomorrow.  Written discharge instructions were provided to the                            patient.                           - Resume previous diet.                           - Continue present medications.                           - Await pathology results.                           - Repeat colonoscopy is recommended at abbreviated                            interval due to poor preparation, however, given                            the patient's limited mobility, residence in a                            skilled living facility and medical comorbidities I                            am not he would tolerated/comply with 2 day bowel                            preparation for repeat colonoscopy. The colonoscopy                            date will be determined after pathology results                            from today's exam become available for review. Procedure Code(s):        --- Professional ---                           [phone], Colonoscopy, flexible; with removal of                            tumor(s),  polyp(s), or other lesion(s) by snare                            technique Diagnosis Code(s):        --- Professional ---                           Z12.11, Encounter for screening for malignant                            neoplasm of colon  K62.1, Rectal polyp CPT copyright 2016 American [employer] Association. All rights reserved. The codes documented in this report are preliminary and upon coder review may  be revised to meet current compliance requirements. Jerene Bears, MD 12/13/2016 10:12:16 AM This report has been signed electronically. Number of Addenda: 0

## 2016-12-13 NOTE — Op Note (Signed)
[facility] Patient Name: Mike Rowe Procedure Date: 12/13/2016 MRN: [id number] Attending MD: Jerene Bears , MD Date of Birth: 1956/04/20 CSN: [id number] Age: 61 Admit Type: Outpatient Procedure:                Upper GI endoscopy Indications:              Cirrhosis rule out esophageal varices Providers:                Lajuan Lines. Hilarie Fredrickson, MD, Hilma Favors, RN, Elspeth Cho Tech., Technician, Heide Scales, CRNA Referring MD:             [facility], Roosevelt Locks, NP Medicines:                Monitored Anesthesia Care Complications:            No immediate complications. Estimated Blood Loss:     Estimated blood loss: none. Procedure:                Pre-Anesthesia Assessment:                           - Prior to the procedure, a History and Physical                            was performed, and patient medications and                            allergies were reviewed. The patient's tolerance of                            previous anesthesia was also reviewed. The risks                            and benefits of the procedure and the sedation                            options and risks were discussed with the patient.                            All questions were answered, and informed consent                            was obtained. Prior Anticoagulants: The patient has                            taken no previous anticoagulant or antiplatelet                            agents. ASA Grade Assessment: III - A patient with                            severe systemic disease. After reviewing the risks  and benefits, the patient was deemed in                            satisfactory condition to undergo the procedure.                           After obtaining informed consent, the endoscope was                            passed under direct vision. Throughout the                            procedure, the patient's blood  pressure, pulse, and                            oxygen saturations were monitored continuously. The                            [id number]) scope was introduced through the                            mouth, and advanced to the second part of duodenum.                            The upper GI endoscopy was accomplished without                            difficulty. The patient tolerated the procedure                            well. Scope In: Scope Out: Findings:      The examined esophagus was normal.      There is no endoscopic evidence of varices in the entire esophagus.      The cardia and gastric fundus were normal on retroflexion.      The entire examined stomach was normal.      There is no endoscopic evidence of varices or portal hypertension       gastropathy in the entire examined stomach.      The examined duodenum was normal. Impression:               - Normal esophagus.                           - Normal stomach.                           - Normal examined duodenum.                           - No specimens collected. Moderate Sedation:      N/A Recommendation:           - Patient has a contact number available for                            emergencies. The signs and symptoms of potential  delayed complications were discussed with the                            patient. Return to normal activities tomorrow.                            Written discharge instructions were provided to the                            patient.                           - Low sodium diet.                           - Continue present medications.                           - Repeat upper endoscopy in 3 years for screening                            purposes.                           - Return to liver clinic as previously scheduled. Procedure Code(s):        --- Professional ---                           [id number], Esophagogastroduodenoscopy, flexible,                             transoral; diagnostic, including collection of                            specimen(s) by brushing or washing, when performed                            (separate procedure) Diagnosis Code(s):        --- Professional ---                           K74.60, Unspecified cirrhosis of liver CPT copyright 2016 American Medical Association. All rights reserved. The codes documented in this report are preliminary and upon coder review may  be revised to meet current compliance requirements. Jerene Bears, MD 12/13/2016 9:15:10 AM This report has been signed electronically. Number of Addenda: 0

## 2016-12-13 NOTE — Discharge Instructions (Signed)
YOU HAD AN ENDOSCOPIC PROCEDURE TODAY: Refer to the procedure report and other information in the discharge instructions given to you for any specific questions about what was found during the examination. If this information does not answer your questions, please call [place] office at [phone] to clarify.  ° °YOU SHOULD EXPECT: Some feelings of bloating in the abdomen. Passage of more gas than usual. Walking can help get rid of the air that was put into your GI tract during the procedure and reduce the bloating. If you had a lower endoscopy (such as a colonoscopy or flexible sigmoidoscopy) you may notice spotting of blood in your stool or on the toilet paper. Some abdominal soreness may be present for a day or two, also. ° °DIET: Your first meal following the procedure should be a light meal and then it is ok to progress to your normal diet. A half-sandwich or bowl of soup is an example of a good first meal. Heavy or fried foods are harder to digest and may make you feel nauseous or bloated. Drink plenty of fluids but you should avoid alcoholic beverages for 24 hours. If you had a esophageal dilation, please see attached instructions for diet.   ° °ACTIVITY: Your care partner should take you home directly after the procedure. You should plan to take it easy, moving slowly for the rest of the day. You can resume normal activity the day after the procedure however YOU SHOULD NOT DRIVE, use power tools, machinery or perform tasks that involve climbing or major physical exertion for 24 hours (because of the sedation medicines used during the test).  ° °SYMPTOMS TO REPORT IMMEDIATELY: °A gastroenterologist can be reached at any hour. Please call [phone]  for any of the following symptoms:  °Following lower endoscopy (colonoscopy, flexible sigmoidoscopy) °Excessive amounts of blood in the stool  °Significant tenderness, worsening of abdominal pains  °Swelling of the abdomen that is new, acute  °Fever of 100° or  higher  °Following upper endoscopy (EGD, EUS, ERCP, esophageal dilation) °Vomiting of blood or coffee ground material  °New, significant abdominal pain  °New, significant chest pain or pain under the shoulder blades  °Painful or persistently difficult swallowing  °New shortness of breath  °Black, tarry-looking or red, bloody stools ° °FOLLOW UP:  °If any biopsies were taken you will be contacted by phone or by letter within the next 1-3 weeks. Call [phone]  if you have not heard about the biopsies in 3 weeks.  °Please also call with any specific questions about appointments or follow up tests. ° °

## 2016-12-14 ENCOUNTER — Encounter (HOSPITAL_COMMUNITY): Payer: Self-pay | Admitting: Internal Medicine

## 2016-12-14 ENCOUNTER — Encounter: Payer: Self-pay | Admitting: Internal Medicine

## 2017-04-17 NOTE — Addendum Note (Signed)
Addendum  created 04/17/17 1037 by Myrtie Soman, MD   Sign clinical note

## 2017-10-09 DIAGNOSIS — M24561 Contracture, right knee: Secondary | ICD-10-CM

## 2017-10-09 HISTORY — DX: Contracture, right knee: M24.561

## 2018-01-20 ENCOUNTER — Encounter: Payer: Self-pay | Admitting: Internal Medicine

## 2018-02-02 ENCOUNTER — Telehealth: Payer: Self-pay | Admitting: Internal Medicine

## 2018-02-02 NOTE — Telephone Encounter (Signed)
Attempted to call Mike Rowe but was unable to reach her. Will try again.

## 2018-02-02 NOTE — Telephone Encounter (Signed)
Ok for direct to hospital outpatient for surveillance colonoscopy Will need 2 day bowel preparation- prep last year was poor

## 2018-02-02 NOTE — Telephone Encounter (Signed)
Pt due for recall colon. Do you want him scheduled for a direct at the hospital or does he need a visit. Please advise.

## 2018-02-06 NOTE — Telephone Encounter (Signed)
Left message for Mike Rowe to call back.

## 2018-02-07 ENCOUNTER — Other Ambulatory Visit: Payer: Self-pay

## 2018-02-07 DIAGNOSIS — Z1211 Encounter for screening for malignant neoplasm of colon: Secondary | ICD-10-CM

## 2018-02-07 NOTE — Telephone Encounter (Signed)
Pt scheduled for previsit 03/08/18 at 2pm, Colon scheduled at [facility] 04/03/18@8 :30am. Pt will need to arrive at [facility] at 7am. Left message for Latosha to call back regarding appt.

## 2018-02-09 NOTE — Telephone Encounter (Signed)
Left message for pt to call back  °

## 2018-02-12 NOTE — Telephone Encounter (Signed)
Spoke with Carolin Sicks and she is aware of appts.

## 2018-03-08 ENCOUNTER — Other Ambulatory Visit: Payer: Self-pay

## 2018-03-08 ENCOUNTER — Ambulatory Visit (AMBULATORY_SURGERY_CENTER): Payer: Self-pay

## 2018-03-08 VITALS — Ht 72.0 in | Wt 160.0 lb

## 2018-03-08 DIAGNOSIS — Z1211 Encounter for screening for malignant neoplasm of colon: Secondary | ICD-10-CM

## 2018-03-08 MED ORDER — NA SULFATE-K SULFATE-MG SULF 17.5-3.13-1.6 GM/177ML PO SOLN: 1.0000 | Freq: Once | ORAL | 0 refills | Status: AC

## 2018-03-29 ENCOUNTER — Encounter (HOSPITAL_COMMUNITY): Payer: Self-pay | Admitting: *Deleted

## 2018-03-29 NOTE — Progress Notes (Addendum)
Preop instructions for:  Mike Rowe                        Date of Birth   -1956/05/23                          Date of Procedure:04/03/2018        Doctor:Dr. Hilarie Fredrickson  Time to arrive at [place] am  Report to: Admitting   Procedure:Colonoscopy with Propofol  Any procedure time changes, MD office will notify you!   Do not eat or drink past midnight the night before your procedure.(To include any tube feedings-must be discontinued)  Reminder:Follow bowel prep instructions per MD office!   Take these morning medications only with sips of water.(or give through gastrostomy or feeding tube).  No medications to be given morning of procedure .(Per nurse at [facility], medications need to be crushed and given in applesauce).  Note: No Insulin or Diabetic meds should be given or taken the morning of the procedure!   Facility contact: [facility]    Phone: [place]: [place]  [phone]  Transportation contact phone#:[facility]   [phone]  Please send day of procedure:current med list and meds last taken that day, confirm nothing by mouth status from what time, Patient Demographic info( to include DNR status, problem list, allergies)   RN contact name/phone#:  Smitty Cords, LPN              [phone]             and Fax [phone]  Bring Insurance card and picture ID Leave all jewelry and other valuables at place where living( no metal or rings to be worn) No contact lens Women-no make-up, no lotions,perfumes,powders Men-no colognes,lotions  Any questions day of procedure,call Endoscopy unit-[phone]!   Sent from :[facility]                   [place]                   Fax:[phone]  Sent by :Charmaine Downs. Tobin Chad

## 2018-03-29 NOTE — Progress Notes (Addendum)
03/14/2018- Progress note from [facility] and Rehab received  on chart and MAR on chart.  02/16/2018- Progress note from [facility] received  on chart  02/14/2018- lab results from [facility] and [facility] received  on chart.-Labs- CBC w/diff., TSH,HgA1C, Lipid panel, Vitamin D, CMP.

## 2018-03-29 NOTE — Progress Notes (Signed)
[place] and Re[facility] and spoke to Babbie, LPN and requested her to Fax to me patient's MAR, Last Progress note and any lab work done within the last 30 days.

## 2018-03-30 NOTE — Progress Notes (Signed)
Faxed over pre-op instructions for patient's procedure on 04/03/2018 to the nurse, Smitty Cords, LPN. Instructed her to me back to go over instructions for understanding.

## 2018-04-02 ENCOUNTER — Encounter (HOSPITAL_COMMUNITY): Payer: Self-pay | Admitting: *Deleted

## 2018-04-03 ENCOUNTER — Ambulatory Visit (HOSPITAL_COMMUNITY): Payer: Medicaid Other | Admitting: Anesthesiology

## 2018-04-03 ENCOUNTER — Other Ambulatory Visit: Payer: Self-pay

## 2018-04-03 ENCOUNTER — Encounter (HOSPITAL_COMMUNITY)
Admission: RE | Disposition: A | Discharge status: Skilled Nursing Facility | Payer: Self-pay | Source: Ambulatory Visit | Attending: Internal Medicine

## 2018-04-03 ENCOUNTER — Ambulatory Visit (HOSPITAL_COMMUNITY)
Admission: RE | Admit: 2018-04-03 | Discharge: 2018-04-03 | Disposition: A | Discharge status: Skilled Nursing Facility | Payer: Medicaid Other | Source: Ambulatory Visit | Attending: Internal Medicine | Admitting: Internal Medicine

## 2018-04-03 ENCOUNTER — Encounter (HOSPITAL_COMMUNITY): Payer: Self-pay

## 2018-04-03 DIAGNOSIS — K219 Gastro-esophageal reflux disease without esophagitis: Secondary | ICD-10-CM | POA: Insufficient documentation

## 2018-04-03 DIAGNOSIS — K635 Polyp of colon: Secondary | ICD-10-CM | POA: Insufficient documentation

## 2018-04-03 DIAGNOSIS — I110 Hypertensive heart disease with heart failure: Secondary | ICD-10-CM | POA: Diagnosis not present

## 2018-04-03 DIAGNOSIS — J45909 Unspecified asthma, uncomplicated: Secondary | ICD-10-CM | POA: Insufficient documentation

## 2018-04-03 DIAGNOSIS — I69354 Hemiplegia and hemiparesis following cerebral infarction affecting left non-dominant side: Secondary | ICD-10-CM | POA: Diagnosis not present

## 2018-04-03 DIAGNOSIS — K7030 Alcoholic cirrhosis of liver without ascites: Secondary | ICD-10-CM | POA: Insufficient documentation

## 2018-04-03 DIAGNOSIS — E119 Type 2 diabetes mellitus without complications: Secondary | ICD-10-CM | POA: Insufficient documentation

## 2018-04-03 DIAGNOSIS — Z87891 Personal history of nicotine dependence: Secondary | ICD-10-CM | POA: Insufficient documentation

## 2018-04-03 DIAGNOSIS — F419 Anxiety disorder, unspecified: Secondary | ICD-10-CM | POA: Insufficient documentation

## 2018-04-03 DIAGNOSIS — D125 Benign neoplasm of sigmoid colon: Secondary | ICD-10-CM | POA: Diagnosis not present

## 2018-04-03 DIAGNOSIS — I5032 Chronic diastolic (congestive) heart failure: Secondary | ICD-10-CM | POA: Diagnosis not present

## 2018-04-03 DIAGNOSIS — Z79891 Long term (current) use of opiate analgesic: Secondary | ICD-10-CM | POA: Diagnosis not present

## 2018-04-03 DIAGNOSIS — Z7951 Long term (current) use of inhaled steroids: Secondary | ICD-10-CM | POA: Diagnosis not present

## 2018-04-03 DIAGNOSIS — Z86010 Personal history of colonic polyps: Secondary | ICD-10-CM | POA: Insufficient documentation

## 2018-04-03 DIAGNOSIS — D122 Benign neoplasm of ascending colon: Secondary | ICD-10-CM | POA: Insufficient documentation

## 2018-04-03 DIAGNOSIS — Z79899 Other long term (current) drug therapy: Secondary | ICD-10-CM | POA: Diagnosis not present

## 2018-04-03 DIAGNOSIS — Z09 Encounter for follow-up examination after completed treatment for conditions other than malignant neoplasm: Secondary | ICD-10-CM | POA: Diagnosis present

## 2018-04-03 DIAGNOSIS — F329 Major depressive disorder, single episode, unspecified: Secondary | ICD-10-CM | POA: Insufficient documentation

## 2018-04-03 DIAGNOSIS — Z1211 Encounter for screening for malignant neoplasm of colon: Secondary | ICD-10-CM

## 2018-04-03 HISTORY — PX: POLYPECTOMY: SHX5525

## 2018-04-03 HISTORY — DX: Contracture, right knee: M24.561

## 2018-04-03 HISTORY — DX: Gastro-esophageal reflux disease without esophagitis: K21.9

## 2018-04-03 HISTORY — DX: Cerebral infarction, unspecified: I63.9

## 2018-04-03 HISTORY — PX: COLONOSCOPY WITH PROPOFOL: SHX5780

## 2018-04-03 HISTORY — DX: Hemiplegia, unspecified affecting unspecified side: G81.90

## 2018-04-03 HISTORY — DX: Heart failure, unspecified: I50.9

## 2018-04-03 HISTORY — DX: Contracture, left ankle: M24.572

## 2018-04-03 HISTORY — DX: Contracture, left hip: M24.552

## 2018-04-03 SURGERY — COLONOSCOPY WITH PROPOFOL
Anesthesia: Monitor Anesthesia Care

## 2018-04-03 MED ORDER — PROPOFOL 10 MG/ML IV BOLUS
INTRAVENOUS | Status: DC | PRN
  Administered 2018-04-03 (×2): 20 mg via INTRAVENOUS

## 2018-04-03 MED ORDER — LACTATED RINGERS IV SOLN
INTRAVENOUS | Status: DC
  Administered 2018-04-03: 1000 mL via INTRAVENOUS

## 2018-04-03 MED ORDER — LIDOCAINE 2% (20 MG/ML) 5 ML SYRINGE
INTRAMUSCULAR | Status: DC | PRN
  Administered 2018-04-03: 100 mg via INTRAVENOUS

## 2018-04-03 MED ORDER — SODIUM CHLORIDE 0.9 % IV SOLN: INTRAVENOUS | Status: DC

## 2018-04-03 MED ORDER — PROPOFOL 10 MG/ML IV BOLUS
INTRAVENOUS | Status: AC
  Filled 2018-04-03: qty 60

## 2018-04-03 MED ORDER — PROPOFOL 500 MG/50ML IV EMUL
INTRAVENOUS | Status: DC | PRN
  Administered 2018-04-03: 100 ug/kg/min via INTRAVENOUS

## 2018-04-03 SURGICAL SUPPLY — 21 items

## 2018-04-03 NOTE — Discharge Instructions (Signed)

## 2018-04-03 NOTE — Anesthesia Procedure Notes (Signed)
Procedure Name: MAC Date/Time: 04/03/2018 8:57 AM Performed by: Dione Booze, CRNA Pre-anesthesia Checklist: Patient identified, Emergency Drugs available, Suction available and Patient being monitored Patient Re-evaluated:Patient Re-evaluated prior to induction Oxygen Delivery Method: Simple face mask Placement Confirmation: positive ETCO2

## 2018-04-03 NOTE — Transfer of Care (Signed)
Immediate Anesthesia Transfer of Care Note  Patient: Mike Rowe  Procedure(s) Performed: COLONOSCOPY WITH PROPOFOL (N/A ) POLYPECTOMY BIOPSY  Patient Location: PACU and Endoscopy Unit  Anesthesia Type:MAC  Level of Consciousness: awake and patient cooperative  Airway & Oxygen Therapy: Patient Spontanous Breathing and Patient connected to face mask oxygen  Post-op Assessment: Report given to RN and Post -op Vital signs reviewed and stable  Post vital signs: Reviewed and stable  Last Vitals:  Vitals Value Taken Time  BP    Temp    Pulse 73 04/03/2018  9:43 AM  Resp 13 04/03/2018  9:43 AM  SpO2 100 % 04/03/2018  9:43 AM  Vitals shown include unvalidated device data.  Last Pain:  Vitals:   04/03/18 0743  TempSrc: Oral  PainSc: 0-No pain         Complications: No apparent anesthesia complications

## 2018-04-03 NOTE — H&P (Signed)
HPI: Mike Rowe is a 62 year old male with a past medical history of hepatitis C and alcohol cirrhosis followed by [facility] liver clinic, CHF, spastic paralysis who presents for repeat colonoscopy due to history of adenomatous colon polyps and poor prep in January 2018.  No GI complaints today.  Patient did a 2-day bowel preparation and states that he is much cleaner than last time.  Denies chest pain, shortness of breath, abdominal pain.  No report of recent blood in stool.  Past Medical History:  Diagnosis Date  . Allergy   . Anxiety   . Asthma   . Cerebral infarction ([place])   . CHF (congestive heart failure) (HCC)    Diastolic Heart failure per progress note from [facility] 02/16/18  . Contracture of ankle, left    per progress note from [facility]-02/16/2018  . Contracture of hip, left    per progress note from [facility] 02/16/2018  . Contracture of knee joint, right 10/09/2017  . Diabetes ([place])   . Dysphagia   . GERD (gastroesophageal reflux disease)    per progress note from [facility] 02/16/2018  . Hemiplegia and hemiparesis ([place]) 03/01/2016   following cerebral infarction affecting left non-dominent side- per progress note from [facility]  . Hepatitis C virus    Hepatitis C  . Hypertension   . Major depressive disorder   . Stroke ([place]) 06/27/2015   cerebral infarction   . Substance abuse [facility])     Past Surgical History:  Procedure Laterality Date  . COLONOSCOPY WITH PROPOFOL N/A 12/13/2016   Procedure: COLONOSCOPY WITH PROPOFOL;  Surgeon: Jerene Bears, MD;  Location: [facility] ENDOSCOPY;  Service: Gastroenterology;  Laterality: N/A;  . ESOPHAGOGASTRODUODENOSCOPY (EGD) WITH PROPOFOL N/A 12/13/2016   Procedure: ESOPHAGOGASTRODUODENOSCOPY (EGD) WITH PROPOFOL;  Surgeon: Jerene Bears, MD;  Location: [facility];  Service: Gastroenterology;  Laterality: N/A;  . LAPAROTOMY N/A 06/28/2015   Procedure: EXPLORATORY  LAPAROTOMY;  Surgeon: Georganna Skeans, MD;  Location: [place];  Service: General;  Laterality: N/A;  . PEG PLACEMENT N/A 06/25/2015   Procedure: PERCUTANEOUS ENDOSCOPIC GASTROSTOMY (PEG) PLACEMENT;  Surgeon: Georganna Skeans, MD;  Location: [place];  Service: General;  Laterality: N/A;  . PEG PLACEMENT N/A 06/28/2015   Procedure: PERCUTANEOUS ENDOSCOPIC GASTROSTOMY (PEG) PLACEMENT;  Surgeon: Georganna Skeans, MD;  Location: [place];  Service: General;  Laterality: N/A;     (Not in an outpatient encounter)  No Known Allergies  Family History  Problem Relation Age of Onset  . Colon cancer Neg Hx   . Stomach cancer Neg Hx   . Rectal cancer Neg Hx   . Liver cancer Neg Hx   . Esophageal cancer Neg Hx     Social History   Tobacco Use  . Smoking status: Former Research scientist (life sciences)  . Smokeless tobacco: Never Used  Substance Use Topics  . Alcohol use: Not Currently  . Drug use: Yes    Types: Cocaine    Comment: Unable to assess as patient is non verbal at this time.    ROS: As per history of present illness, otherwise negative  BP (!) 163/87   Pulse (!) 59   Temp 97.8 F (36.6 C) (Oral)   Resp 12   Ht 6' (1.829 m)   Wt 160 lb (72.6 kg)   SpO2 95%   BMI 21.70 kg/m  Gen: awake, alert, NAD HEENT: anicteric, op clear CV: RRR, no mrg Pulm: CTA b/l Abd: soft,  NT/ND, +BS throughout Ext: contracture left upper/lower ext, no edema   RELEVANT LABS AND IMAGING: CBC    Component Value Date/Time   WBC 13.5 (H) 12/13/2016 0800   RBC 4.51 12/13/2016 0800   HGB 14.5 12/13/2016 0800   HCT 42.6 12/13/2016 0800   PLT 121 (L) 12/13/2016 0800   MCV 94.5 12/13/2016 0800   MCH 32.2 12/13/2016 0800   MCHC 34.0 12/13/2016 0800   RDW 13.3 12/13/2016 0800   LYMPHSABS 1.0 06/28/2015 1002   MONOABS 0.5 06/28/2015 1002   EOSABS 0.1 06/28/2015 1002   BASOSABS 0.0 06/28/2015 1002    CMP     Component Value Date/Time   NA 143 12/13/2016 0800   K 4.4 12/13/2016 0800   CL 110 12/13/2016 0800   CO2 23  12/13/2016 0800   GLUCOSE 125 (H) 12/13/2016 0800   BUN 21 (H) 12/13/2016 0800   CREATININE 0.87 12/13/2016 0800   CALCIUM 9.4 12/13/2016 0800   PROT 6.5 06/29/2015 0315   ALBUMIN 3.1 (L) 06/29/2015 0315   AST 96 (H) 06/29/2015 0315   ALT 174 (H) 06/29/2015 0315   ALKPHOS 75 06/29/2015 0315   BILITOT 1.0 06/29/2015 0315   GFRNONAA >60 12/13/2016 0800   GFRAA >60 12/13/2016 0800    ASSESSMENT/PLAN: 62 year old male with a past medical history of hepatitis C and alcohol cirrhosis followed by [facility] liver clinic, CHF, spastic paralysis who presents for repeat colonoscopy due to history of adenomatous colon polyps and poor prep in January 2018.  1.  History of adenomatous colon polyps --repeat colonoscopy today after adenomatous colon polyps found in January 2018 though prep was poor.  The nature of the procedure, as well as the risks, benefits, and alternatives were carefully and thoroughly reviewed with the patient. Ample time for discussion and questions allowed. The patient understood, was satisfied, and agreed to proceed.

## 2018-04-03 NOTE — Anesthesia Preprocedure Evaluation (Signed)
Anesthesia Evaluation  Patient identified by MRN, date of birth, ID band Patient awake    Reviewed: Allergy & Precautions, NPO status , Patient's Chart, lab work & pertinent test results  Airway Mallampati: II  TM Distance: >3 FB Neck ROM: Full    Dental no notable dental hx.    Pulmonary neg pulmonary ROS, former smoker,    Pulmonary exam normal breath sounds clear to auscultation       Cardiovascular hypertension, Normal cardiovascular exam Rhythm:Regular Rate:Normal     Neuro/Psych CVA, Residual Symptoms negative psych ROS   GI/Hepatic negative GI ROS, (+)     substance abuse  cocaine use,   Endo/Other  diabetes  Renal/GU negative Renal ROS  negative genitourinary   Musculoskeletal negative musculoskeletal ROS (+)   Abdominal   Peds negative pediatric ROS (+)  Hematology negative hematology ROS (+)   Anesthesia Other Findings   Reproductive/Obstetrics negative OB ROS                             Anesthesia Physical  Anesthesia Plan  ASA: III  Anesthesia Plan: MAC   Post-op Pain Management:    Induction: Intravenous  PONV Risk Score and Plan: 1 and Ondansetron and Treatment may vary due to age or medical condition  Airway Management Planned: Nasal Cannula  Additional Equipment:   Intra-op Plan:   Post-operative Plan:   Informed Consent: I have reviewed the patients History and Physical, chart, labs and discussed the procedure including the risks, benefits and alternatives for the proposed anesthesia with the patient or authorized representative who has indicated his/her understanding and acceptance.   Dental advisory given  Plan Discussed with: CRNA and Surgeon  Anesthesia Plan Comments:         Anesthesia Quick Evaluation

## 2018-04-03 NOTE — Anesthesia Postprocedure Evaluation (Signed)
Anesthesia Post Note  Patient: Roxie Gueye  Procedure(s) Performed: COLONOSCOPY WITH PROPOFOL (N/A ) POLYPECTOMY BIOPSY     Patient location during evaluation: PACU Anesthesia Type: MAC Level of consciousness: awake and alert Pain management: pain level controlled Vital Signs Assessment: post-procedure vital signs reviewed and stable Respiratory status: spontaneous breathing, nonlabored ventilation, respiratory function stable and patient connected to nasal cannula oxygen Cardiovascular status: stable and blood pressure returned to baseline Postop Assessment: no apparent nausea or vomiting Anesthetic complications: no    Last Vitals:  Vitals:   04/03/18 0958 04/03/18 1000  BP: 132/90 132/90  Pulse: 68 64  Resp: (!) 21 15  Temp:    SpO2: 94% 97%    Last Pain:  Vitals:   04/03/18 0945  TempSrc: Oral  PainSc: 0-No pain                 Montez Hageman

## 2018-04-03 NOTE — Op Note (Signed)
[facility] Patient Name: Mike Rowe Procedure Date: 04/03/2018 MRN: [id number] Attending MD: Jerene Bears , MD Date of Birth: Oct 27, 1956 CSN: [id number] Age: 62 Admit Type: Inpatient Procedure:                Colonoscopy Indications:              Surveillance: History of adenomatous polyps,                            inadequate prep on last exam (<60yr); last                            colonoscopy Jan 2018 Providers:                Lajuan Lines. Hilarie Fredrickson, MD, [facility], RN, Alan Mulder,                            Technician, Dione Booze, CRNA Referring MD:             Rosalyn Charters. Jannette Fogo, MD Medicines:                Monitored Anesthesia Care Complications:            No immediate complications. Estimated Blood Loss:     Estimated blood loss was minimal. Procedure:                Pre-Anesthesia Assessment:                           - Prior to the procedure, a History and Physical                            was performed, and patient medications and                            allergies were reviewed. The patient's tolerance of                            previous anesthesia was also reviewed. The risks                            and benefits of the procedure and the sedation                            options and risks were discussed with the patient.                            All questions were answered, and informed consent                            was obtained. Prior Anticoagulants: The patient has                            taken no previous anticoagulant or antiplatelet  agents. ASA Grade Assessment: III - A patient with                            severe systemic disease. After reviewing the risks                            and benefits, the patient was deemed in                            satisfactory condition to undergo the procedure.                           After obtaining informed consent, the colonoscope   was passed under direct vision. Throughout the                            procedure, the patient's blood pressure, pulse, and                            oxygen saturations were monitored continuously. The                            EC-[id number] ([id number]) scope was introduced through                            the anus and advanced to the cecum, identified by                            appendiceal orifice and ileocecal valve. The                            colonoscopy was performed without difficulty. The                            patient tolerated the procedure well. The quality                            of the bowel preparation was good. The ileocecal                            valve, appendiceal orifice, and rectum were                            photographed. Scope In: 9:08:45 AM Scope Out: 9:37:36 AM Scope Withdrawal Time: 0 hours 19 minutes 10 seconds  Total Procedure Duration: 0 hours 28 minutes 51 seconds  Findings:      The perianal and digital rectal examinations were normal.      Four sessile polyps were found in the ascending colon. The polyps were 3       to 9 mm in size. These polyps were removed with a cold snare. Resection       and retrieval were complete.      A 5 mm polyp was found in the sigmoid colon. The polyp was sessile. The       polyp  was removed with a cold snare. Resection and retrieval were       complete.      The exam was otherwise without abnormality on direct and retroflexion       views. Impression:               - Four 3 to 9 mm polyps in the ascending colon,                            removed with a cold snare. Resected and retrieved.                           - One 5 mm polyp in the sigmoid colon, removed with                            a cold snare. Resected and retrieved.                           - The examination was otherwise normal on direct                            and retroflexion views. Moderate Sedation:      N/A Recommendation:            - Patient has a contact number available for                            emergencies. The signs and symptoms of potential                            delayed complications were discussed with the                            patient. Return to normal activities tomorrow.                            Written discharge instructions were provided to the                            patient.                           - Resume previous diet.                           - Continue present medications.                           - Await pathology results.                           - Repeat colonoscopy in 3 years for surveillance. Procedure Code(s):        --- Professional ---                           [phone], Colonoscopy, flexible; with removal of  tumor(s), polyp(s), or other lesion(s) by snare                            technique Diagnosis Code(s):        --- Professional ---                           Z86.010, Personal history of colonic polyps                           D12.2, Benign neoplasm of ascending colon                           D12.5, Benign neoplasm of sigmoid colon CPT copyright 2017 [employer]. All rights reserved. The codes documented in this report are preliminary and upon coder review may  be revised to meet current compliance requirements. Jerene Bears, MD 04/03/2018 9:45:19 AM This report has been signed electronically. Number of Addenda: 0

## 2018-04-04 ENCOUNTER — Encounter: Payer: Self-pay | Admitting: Internal Medicine

## 2018-04-04 ENCOUNTER — Encounter (HOSPITAL_COMMUNITY): Payer: Self-pay | Admitting: Internal Medicine

## 2019-09-12 ENCOUNTER — Encounter (HOSPITAL_COMMUNITY): Payer: Self-pay | Admitting: Family Medicine

## 2019-09-12 ENCOUNTER — Inpatient Hospital Stay (HOSPITAL_COMMUNITY)
Admission: AD | Admit: 2019-09-12 | Discharge: 2019-09-17 | DRG: 177 | Disposition: A | Discharge status: Skilled Nursing Facility | Payer: Medicaid Other | Source: Other Acute Inpatient Hospital | Attending: Family Medicine | Admitting: Family Medicine

## 2019-09-12 ENCOUNTER — Other Ambulatory Visit: Payer: Self-pay

## 2019-09-12 DIAGNOSIS — U071 COVID-19: Secondary | ICD-10-CM | POA: Diagnosis present

## 2019-09-12 DIAGNOSIS — R7303 Prediabetes: Secondary | ICD-10-CM | POA: Diagnosis present

## 2019-09-12 DIAGNOSIS — J1289 Other viral pneumonia: Secondary | ICD-10-CM | POA: Diagnosis present

## 2019-09-12 DIAGNOSIS — F329 Major depressive disorder, single episode, unspecified: Secondary | ICD-10-CM | POA: Diagnosis present

## 2019-09-12 DIAGNOSIS — N390 Urinary tract infection, site not specified: Secondary | ICD-10-CM | POA: Diagnosis present

## 2019-09-12 DIAGNOSIS — K219 Gastro-esophageal reflux disease without esophagitis: Secondary | ICD-10-CM | POA: Diagnosis present

## 2019-09-12 DIAGNOSIS — I5032 Chronic diastolic (congestive) heart failure: Secondary | ICD-10-CM | POA: Diagnosis present

## 2019-09-12 DIAGNOSIS — E876 Hypokalemia: Secondary | ICD-10-CM | POA: Diagnosis present

## 2019-09-12 DIAGNOSIS — Z79899 Other long term (current) drug therapy: Secondary | ICD-10-CM | POA: Diagnosis not present

## 2019-09-12 DIAGNOSIS — B961 Klebsiella pneumoniae [K. pneumoniae] as the cause of diseases classified elsewhere: Secondary | ICD-10-CM | POA: Diagnosis present

## 2019-09-12 DIAGNOSIS — K7460 Unspecified cirrhosis of liver: Secondary | ICD-10-CM

## 2019-09-12 DIAGNOSIS — Z87891 Personal history of nicotine dependence: Secondary | ICD-10-CM | POA: Diagnosis not present

## 2019-09-12 DIAGNOSIS — J9601 Acute respiratory failure with hypoxia: Secondary | ICD-10-CM | POA: Diagnosis present

## 2019-09-12 DIAGNOSIS — K5900 Constipation, unspecified: Secondary | ICD-10-CM | POA: Diagnosis present

## 2019-09-12 DIAGNOSIS — Z7401 Bed confinement status: Secondary | ICD-10-CM

## 2019-09-12 DIAGNOSIS — Z1611 Resistance to penicillins: Secondary | ICD-10-CM | POA: Diagnosis present

## 2019-09-12 DIAGNOSIS — E785 Hyperlipidemia, unspecified: Secondary | ICD-10-CM | POA: Diagnosis present

## 2019-09-12 DIAGNOSIS — R197 Diarrhea, unspecified: Secondary | ICD-10-CM | POA: Diagnosis not present

## 2019-09-12 DIAGNOSIS — F419 Anxiety disorder, unspecified: Secondary | ICD-10-CM | POA: Diagnosis present

## 2019-09-12 DIAGNOSIS — J4540 Moderate persistent asthma, uncomplicated: Secondary | ICD-10-CM | POA: Diagnosis present

## 2019-09-12 DIAGNOSIS — I69154 Hemiplegia and hemiparesis following nontraumatic intracerebral hemorrhage affecting left non-dominant side: Secondary | ICD-10-CM | POA: Diagnosis not present

## 2019-09-12 DIAGNOSIS — J9811 Atelectasis: Secondary | ICD-10-CM | POA: Diagnosis present

## 2019-09-12 DIAGNOSIS — J069 Acute upper respiratory infection, unspecified: Secondary | ICD-10-CM | POA: Diagnosis not present

## 2019-09-12 DIAGNOSIS — N400 Enlarged prostate without lower urinary tract symptoms: Secondary | ICD-10-CM | POA: Diagnosis present

## 2019-09-12 DIAGNOSIS — W06XXXA Fall from bed, initial encounter: Secondary | ICD-10-CM | POA: Diagnosis present

## 2019-09-12 DIAGNOSIS — I110 Hypertensive heart disease with heart failure: Secondary | ICD-10-CM | POA: Diagnosis present

## 2019-09-12 DIAGNOSIS — Y92122 Bedroom in nursing home as the place of occurrence of the external cause: Secondary | ICD-10-CM | POA: Diagnosis not present

## 2019-09-12 DIAGNOSIS — Z7951 Long term (current) use of inhaled steroids: Secondary | ICD-10-CM | POA: Diagnosis not present

## 2019-09-12 LAB — MRSA PCR SCREENING: MRSA by PCR: NEGATIVE

## 2019-09-12 MED ORDER — ALBUTEROL SULFATE HFA 108 (90 BASE) MCG/ACT IN AERS
2.0000 | INHALATION_SPRAY | RESPIRATORY_TRACT | Status: DC | PRN
  Administered 2019-09-12 – 2019-09-14 (×2): 2 via RESPIRATORY_TRACT
  Filled 2019-09-12: qty 6.7

## 2019-09-12 MED ORDER — AMLODIPINE BESYLATE 5 MG PO TABS
5.0000 mg | ORAL_TABLET | Freq: Every day | ORAL | Status: DC
  Administered 2019-09-13 – 2019-09-17 (×5): 5 mg via ORAL
  Filled 2019-09-12 (×5): qty 1

## 2019-09-12 MED ORDER — SODIUM CHLORIDE 0.9 % IV SOLN
200.0000 mg | Freq: Once | INTRAVENOUS | Status: AC
  Administered 2019-09-12: 21:00:00 200 mg via INTRAVENOUS
  Filled 2019-09-12: qty 40

## 2019-09-12 MED ORDER — SODIUM CHLORIDE 0.9 % IV SOLN
250.0000 mL | INTRAVENOUS | Status: DC | PRN
  Administered 2019-09-13: 10:00:00 250 mL via INTRAVENOUS

## 2019-09-12 MED ORDER — VITAMIN D 25 MCG (1000 UNIT) PO TABS
2000.0000 [IU] | ORAL_TABLET | Freq: Every day | ORAL | Status: DC
  Administered 2019-09-13 – 2019-09-17 (×5): 2000 [IU] via ORAL
  Filled 2019-09-12 (×5): qty 2

## 2019-09-12 MED ORDER — FENOFIBRATE 160 MG PO TABS
160.0000 mg | ORAL_TABLET | Freq: Every day | ORAL | Status: DC
  Administered 2019-09-13: 10:00:00 160 mg via ORAL
  Filled 2019-09-12 (×2): qty 1

## 2019-09-12 MED ORDER — PANTOPRAZOLE SODIUM 40 MG PO TBEC
40.0000 mg | DELAYED_RELEASE_TABLET | Freq: Every day | ORAL | Status: DC
  Administered 2019-09-13 – 2019-09-17 (×5): 40 mg via ORAL
  Filled 2019-09-12 (×5): qty 1

## 2019-09-12 MED ORDER — POLYETHYLENE GLYCOL 3350 17 G PO PACK: 17.0000 g | PACK | Freq: Every day | ORAL | Status: DC | PRN

## 2019-09-12 MED ORDER — TAMSULOSIN HCL 0.4 MG PO CAPS
0.4000 mg | ORAL_CAPSULE | Freq: Every day | ORAL | Status: DC
  Administered 2019-09-13 – 2019-09-17 (×5): 0.4 mg via ORAL
  Filled 2019-09-12 (×5): qty 1

## 2019-09-12 MED ORDER — HYDROCOD POLST-CPM POLST ER 10-8 MG/5ML PO SUER: 5.0000 mL | Freq: Two times a day (BID) | ORAL | Status: DC | PRN

## 2019-09-12 MED ORDER — GUAIFENESIN-DM 100-10 MG/5ML PO SYRP
10.0000 mL | ORAL_SOLUTION | ORAL | Status: DC | PRN
  Administered 2019-09-12 – 2019-09-15 (×2): 10 mL via ORAL
  Filled 2019-09-12 (×2): qty 10

## 2019-09-12 MED ORDER — SODIUM CHLORIDE 0.9% FLUSH
3.0000 mL | INTRAVENOUS | Status: DC | PRN
  Administered 2019-09-13: 10:00:00 3 mL via INTRAVENOUS
  Filled 2019-09-12: qty 3

## 2019-09-12 MED ORDER — ENOXAPARIN SODIUM 40 MG/0.4ML ~~LOC~~ SOLN
40.0000 mg | SUBCUTANEOUS | Status: DC
  Administered 2019-09-12 – 2019-09-16 (×5): 40 mg via SUBCUTANEOUS
  Filled 2019-09-12 (×5): qty 0.4

## 2019-09-12 MED ORDER — DOCUSATE SODIUM 100 MG PO CAPS
200.0000 mg | ORAL_CAPSULE | Freq: Every day | ORAL | Status: DC
  Administered 2019-09-13: 200 mg via ORAL
  Filled 2019-09-12: qty 2

## 2019-09-12 MED ORDER — VITAMIN C 500 MG PO TABS
500.0000 mg | ORAL_TABLET | Freq: Every day | ORAL | Status: DC
  Administered 2019-09-12 – 2019-09-17 (×6): 500 mg via ORAL
  Filled 2019-09-12 (×6): qty 1

## 2019-09-12 MED ORDER — ZINC SULFATE 220 (50 ZN) MG PO CAPS
220.0000 mg | ORAL_CAPSULE | Freq: Every day | ORAL | Status: DC
  Administered 2019-09-12 – 2019-09-17 (×6): 220 mg via ORAL
  Filled 2019-09-12 (×6): qty 1

## 2019-09-12 MED ORDER — ONDANSETRON HCL 4 MG PO TABS: 4.0000 mg | ORAL_TABLET | Freq: Four times a day (QID) | ORAL | Status: DC | PRN

## 2019-09-12 MED ORDER — SODIUM CHLORIDE 0.9% FLUSH
3.0000 mL | Freq: Two times a day (BID) | INTRAVENOUS | Status: DC
  Administered 2019-09-12 – 2019-09-16 (×6): 3 mL via INTRAVENOUS

## 2019-09-12 MED ORDER — ORAL CARE MOUTH RINSE
15.0000 mL | Freq: Two times a day (BID) | OROMUCOSAL | Status: DC
  Administered 2019-09-12 – 2019-09-17 (×10): 15 mL via OROMUCOSAL

## 2019-09-12 MED ORDER — SODIUM CHLORIDE 0.9 % IV SOLN
100.0000 mg | INTRAVENOUS | Status: AC
  Administered 2019-09-13 – 2019-09-16 (×4): 100 mg via INTRAVENOUS
  Filled 2019-09-12 (×4): qty 20

## 2019-09-12 MED ORDER — SODIUM CHLORIDE 0.9 % IV SOLN
2.0000 g | INTRAVENOUS | Status: AC
  Administered 2019-09-13 – 2019-09-17 (×5): 2 g via INTRAVENOUS
  Filled 2019-09-12 (×5): qty 20

## 2019-09-12 MED ORDER — ACETAMINOPHEN 325 MG PO TABS
650.0000 mg | ORAL_TABLET | Freq: Four times a day (QID) | ORAL | Status: DC | PRN
  Administered 2019-09-14: 650 mg via ORAL
  Filled 2019-09-12: qty 2

## 2019-09-12 MED ORDER — FLUTICASONE PROPIONATE 50 MCG/ACT NA SUSP
1.0000 | Freq: Every day | NASAL | Status: DC
  Administered 2019-09-13 – 2019-09-17 (×5): 1 via NASAL
  Filled 2019-09-12: qty 16

## 2019-09-12 MED ORDER — ONDANSETRON HCL 4 MG/2ML IJ SOLN: 4.0000 mg | Freq: Four times a day (QID) | INTRAMUSCULAR | Status: DC | PRN

## 2019-09-12 MED ORDER — CITALOPRAM HYDROBROMIDE 10 MG PO TABS
20.0000 mg | ORAL_TABLET | Freq: Every day | ORAL | Status: DC
  Administered 2019-09-13 – 2019-09-17 (×5): 20 mg via ORAL
  Filled 2019-09-12 (×6): qty 2

## 2019-09-12 MED ORDER — MOMETASONE FURO-FORMOTEROL FUM 200-5 MCG/ACT IN AERO
2.0000 | INHALATION_SPRAY | Freq: Two times a day (BID) | RESPIRATORY_TRACT | Status: DC
  Administered 2019-09-12 – 2019-09-17 (×10): 2 via RESPIRATORY_TRACT
  Filled 2019-09-12: qty 8.8

## 2019-09-12 MED ORDER — SODIUM CHLORIDE 0.9% FLUSH
3.0000 mL | Freq: Two times a day (BID) | INTRAVENOUS | Status: DC
  Administered 2019-09-12 – 2019-09-17 (×8): 3 mL via INTRAVENOUS

## 2019-09-12 NOTE — Progress Notes (Signed)
Pt was tx from [facility] for [place]. His sats was low on RA. CXR was neg but Dr. Bonner Puna suspect PNA on exam due to crackles. He also has a hx of hep C.  ALT 71  Remdesivir 200mg  IV x1 then 100mg  IV q24 x4 F/u ALT  Onnie Boer, PharmD, BCIDP, AAHIVP, CPP Infectious Disease Pharmacist 09/12/2019 5:57 PM

## 2019-09-12 NOTE — H&P (Signed)
History and Physical   Dhruva Fearnley [id number] DOB: June 06, 1956 DOA: 09/12/2019  Referring MD/NP/PA: Dr. Eileen Stanford, EDP PCP: Bonnita Nasuti, MD  Patient coming from: [facility] by way of [facility] ED  Chief Complaint: Fall out of bed  HPI: Roch Druin is a 63 y.o. male with a history of ICH, cirrhosis, asthma, and diastolic heart failure among others who presented to [facility] after falling out of bed onto the floor. He is bed bound after ICH with left-sided contractures, also history of cocaine use. In the ED he was not complaining of any pain, dyspnea, or urinary problems. He was noted to be hypoxic to 89% on room air and febrile to 101.24F  with positive SARS-CoV-2 though no infiltrates on CXR. He had no deformities on exam. Pelvis XR no deformities. Urinalysis suggested UTI for which ceftriaxone and tylenol were given and admission to [facility] was requested.   He reports having some increased shortness of breath but only mild and intermittent for the previous couple days, denies subjective fevers. He also reports some intermittent burning with urination but hasn't noticed any hematuria or other bleeding. Does not have pain related to the fall.   Review of Systems: Denies fever, chills, weight loss, changes in vision or hearing, headache, sore throat, chest pain, palpitations, abdominal pain, nausea, vomiting, changes in bowel habits, blood in stool, new myalgias, arthralgias, or rashes, and per HPI. All others reviewed and are negative.   Past Medical History:  Diagnosis Date  . Allergy   . Anxiety   . Asthma   . Cerebral infarction ([place])   . CHF (congestive heart failure) (HCC)    Diastolic Heart failure per progress note from [facility] and Rehab 02/16/18  . Contracture of ankle, left    per progress note from [facility]-02/16/2018  . Contracture of hip, left    per progress note from [facility] 02/16/2018  . Contracture of knee joint, right 10/09/2017  .  Diabetes ([place])   . Dysphagia   . GERD (gastroesophageal reflux disease)    per progress note from [facility] and Rehab 02/16/2018  . Hemiplegia and hemiparesis 03/01/2016   following cerebral infarction affecting left non-dominent side- per progress note from [facility]  . Hepatitis C virus    Hepatitis C  . Hypertension   . Major depressive disorder   . Stroke ([place]) 06/27/2015   cerebral infarction   . Substance abuse [facility])    Past Surgical History:  Procedure Laterality Date  . COLONOSCOPY WITH PROPOFOL N/A 12/13/2016   Procedure: COLONOSCOPY WITH PROPOFOL;  Surgeon: Jerene Bears, MD;  Location: [facility];  Service: Gastroenterology;  Laterality: N/A;  . COLONOSCOPY WITH PROPOFOL N/A 04/03/2018   Procedure: COLONOSCOPY WITH PROPOFOL;  Surgeon: Jerene Bears, MD;  Location: [facility];  Service: Gastroenterology;  Laterality: N/A;  . ESOPHAGOGASTRODUODENOSCOPY (EGD) WITH PROPOFOL N/A 12/13/2016   Procedure: ESOPHAGOGASTRODUODENOSCOPY (EGD) WITH PROPOFOL;  Surgeon: Jerene Bears, MD;  Location: [facility];  Service: Gastroenterology;  Laterality: N/A;  . LAPAROTOMY N/A 06/28/2015   Procedure: EXPLORATORY LAPAROTOMY;  Surgeon: Georganna Skeans, MD;  Location: [place];  Service: General;  Laterality: N/A;  . PEG PLACEMENT N/A 06/25/2015   Procedure: PERCUTANEOUS ENDOSCOPIC GASTROSTOMY (PEG) PLACEMENT;  Surgeon: Georganna Skeans, MD;  Location: [place];  Service: General;  Laterality: N/A;  . PEG PLACEMENT N/A 06/28/2015   Procedure: PERCUTANEOUS ENDOSCOPIC GASTROSTOMY (PEG) PLACEMENT;  Surgeon: Georganna Skeans, MD;  Location: [place];  Service: General;  Laterality: N/A;  . POLYPECTOMY  04/03/2018   Procedure: POLYPECTOMY;  Surgeon: Jerene Bears, MD;  Location: [facility];  Service: Gastroenterology;;   - Remote tobacco and illicit drug user. None in the past 3.5 years since stroke rendered him dependent for ADLs, living at [facility] since that time. Divorced.  - Denies any  known drug allergies  Family History  Problem Relation Age of Onset  . Colon cancer Neg Hx   . Stomach cancer Neg Hx   . Rectal cancer Neg Hx   . Liver cancer Neg Hx   . Esophageal cancer Neg Hx    - Family history otherwise reviewed and not pertinent. Prior to Admission medications   Medication Sig Start Date End Date Taking? Authorizing Provider  acetaminophen (TYLENOL) 325 MG tablet Take 650 mg by mouth every 6 (six) hours as needed for mild pain or moderate pain.    [provider]  amLODipine (NORVASC) 10 MG tablet Take 1 tablet (10 mg total) by mouth daily. Patient taking differently: Take 5 mg by mouth daily.  06/25/15   Garvin Fila, MD  baclofen (LIORESAL) 20 MG tablet Take 30 mg by mouth 3 (three) times daily.    [provider]  budesonide-formoterol (SYMBICORT) 160-4.5 MCG/ACT inhaler Inhale 2 puffs into the lungs 2 (two) times daily.    [provider]  Cholecalciferol (VITAMIN D3) 2000 units TABS Take 2,000 Units by mouth daily.    [provider]  citalopram (CELEXA) 10 MG tablet Take 10 mg by mouth daily.     [provider]  dextromethorphan-guaiFENesin (MUCINEX DM) 30-600 MG 12hr tablet Take 1 tablet by mouth 2 (two) times daily as needed for cough.    [provider]  diazepam (VALIUM) 2 MG tablet Take 2 mg by mouth 2 (two) times daily.     [provider]  docusate sodium (COLACE) 100 MG capsule Take 200 mg by mouth daily.     [provider]  fenofibrate micronized (LOFIBRA) 200 MG capsule Take 200 mg by mouth daily before breakfast.    [provider]  fluticasone (FLONASE) 50 MCG/ACT nasal spray Place 1 spray into both nostrils See admin instructions. Use 1 spray in each nostril daily. Use 1 spray in each nostril daily as needed for rhinitis    [provider]  furosemide (LASIX) 20 MG tablet Take 20 mg by mouth daily.    [provider]  ipratropium-albuterol (DUONEB)  0.5-2.5 (3) MG/3ML SOLN Take 3 mLs by nebulization every 6 (six) hours as needed (for wheezing).    [provider]  labetalol (NORMODYNE) 300 MG tablet Take 1 tablet (300 mg total) by mouth 3 (three) times daily. 06/25/15   Garvin Fila, MD  lisinopril (PRINIVIL,ZESTRIL) 10 MG tablet Take 10 mg by mouth daily.    [provider]  Multiple Vitamins-Minerals (MULTIVITAMIN WITH MINERALS) tablet Take 1 tablet by mouth daily.    [provider]  omeprazole (PRILOSEC) 20 MG capsule Take 20 mg by mouth daily.    [provider]  oxyCODONE-acetaminophen (PERCOCET) 7.5-325 MG tablet Take 1 tablet by mouth every 8 (eight) hours as needed for severe pain.     [provider]  polyethylene glycol (MIRALAX / GLYCOLAX) packet Take 17 g by mouth See admin instructions. Mix 17 g and drink once daily. Mix 17 g and drink once daily as needed for constipation    [provider]  tamsulosin (FLOMAX) 0.4  MG CAPS capsule Take 0.4 mg by mouth daily.    [provider]    Physical Exam:  Constitutional: Chronically ill-appearing male in no distress, calm demeanor Eyes: Lids and conjunctivae normal, PERRL ENMT: Mucous membranes are moist. Posterior pharynx clear of any exudate or lesions. Poor dentition.  Neck: normal, supple, no masses, no thyromegaly Respiratory: Non-labored breathing 2L O2 without accessory muscle use. Crackles L > R laterally. Cardiovascular: Regular rate and rhythm, no murmurs, rubs, or gallops. No carotid bruits. No JVD. No pitting LE edema. Palpable pedal pulses. Abdomen: Normoactive bowel sounds. No tenderness, non-distended, and no masses palpated. No hepatosplenomegaly. GU: No indwelling catheter Musculoskeletal: No clubbing / cyanosis. Decreased muscle bulk, increased muscle tone and decreased ROM.  Skin: Warm, dry. Abdominal scars including PEG site, midline incision. Some scaly erythematous dermatitis in discrete locations  around umbilicus and on left lateral lower leg. No warmth or spreading erythema/tracking. No other rashes, wounds, or ulcers on visualized skin. Neurologic: CN II-XII grossly intact. Spastic hemiplegia on left with contractures, decreased right leg movement.  Psychiatric: Alert and oriented x3. Fair judgment and insight. Mood normal with diminished affect.   Labs on Admission: I have personally reviewed and summarized available laboratory and radiographic data below:  WBC 3.9k, hgb 13.8 with normal indices, platelets 97k   ABG 7.43/36/64 on room air CMP with normal electrolytes, BUN 12, and creatinine 0.7. Glucose 116.   AST 104 (H), ALT 71 (H), AP 42 (L), Bili 1.0. INR 1.1 UA +nitrites, TNTC WBCs 4+ bacteria.  CRP 43.2, LDH 581, CK 124, PCT 0.10 (nl <0.05) BNP 188 (wnl), troponin < 0.01.   Pelvis XR: Stable bilateral hip contractures. No acute hip or pelvis fractures are identified.  AP CXR: Overlying left hand. Lungs are grossly clear. Thoracic aortic calcifications noted.  Assessment/Plan Principal Problem:   Acute respiratory disease due to COVID-19 virus Active Problems:   Acute respiratory failure with hypoxia (HCC)   Cirrhosis of liver without ascites (HCC)   Acute hypoxic respiratory failure due to covid-19, atelectasis likely also contributing: Despite no infiltrate on CXR at admission, I do hear crackles on exam and strongly suspect pneumonia based this and hypoxia with elevated inflammatory markers.  - Start remdesivir x5 days as he is at very high risk of poor outcome from covid.  - Holding steroids given the presence of bacterial infection. If hypoxia progresses, or wheezing is noted, however, would need to start steroids.  - No indication to give convalescent plasma at this time.  - Continue airborne, contact precautions. PPE including surgical gown, gloves, cap, shoe covers, and CAPR used during this encounter in a negative pressure room.  - Check daily labs: CBC w/diff,  CMP, d-dimer, ferritin, CRP - Enoxaparin standard prophylactic dose. D-dimer noted to be mildly elevated c/w covid.   - Maintain euvolemia/net negative.  - Avoid NSAIDs - Recommend proning and aggressive use of incentive spirometry. - Goals of care were discussed. Prognosis is guarded. Full code confirmed.   UTI:  - Monitor urine culture sent from [facility] - Continue ceftriaxone daily  Hepatic cirrhosis without ascites: Suspected to be due to hepatitis C and EtOH. Chronically elevated LFTs noted, INR reassuringly 1.1 and albumin 4.0, suggesting compensation  - Avoid hepatotoxins. Will hold statin while giving remdesivir given this history.  T2DM: Possible history per EMR, though only A1c on file is 5.2% in 2016 and no medications listed. No significant hyperglycemia on labs thus far.  - Check fasting glucose on labs in  AM and HbA1c.   HFpEF:  - Hold lasix (which is not on facility [facility] as he's not overloaded by BNP, CXR, or exam.  - Monitor I/O, daily weights  HTN:  - Norvasc 5mg  reordered. Lisinopril and labetalol not on facility MAR  HLD:  - Continue fenofibrate 160mg , hold statin for now with elevated LFTs  Anxiety disorder, major depressive disorder:  - Continue citalopram 20mg . Has valium on MAR here, not on facility [facility], so will hold for now.   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 - PT/OT. Due to hx dysphagia and even requiring PEG tube in the past, will get SLP evaluation. Soft mechanical diet pending this evaluation.   Moderate persistent asthma, history of tobacco use: No evidence of exacerbation but has noticed some wheezing PTA.  - Scheduled combivent, formulary equivalent for symbicort - Prn albuterol  GERD:  - PPI  BPH:  - Monitor UOP closely, check bladder scan. Suspect this predisposes to UTIs. Will also order flomax per older MAR  pending full pharmacist review.   Constipation:  - Continue miralax, colace scheduled daily  DVT prophylaxis: Lovenox  Code Status: Full  Family Communication: None at bedside Disposition Plan: Return to SNF once stabilized Consults called: None  Admission status: Inpatient    [name], MD [facility] [email or web address] 09/12/2019, 5:27 PM

## 2019-09-13 LAB — C-REACTIVE PROTEIN: CRP: 4.4 mg/dL — ABNORMAL HIGH (ref <1.0)

## 2019-09-13 LAB — CBC WITH DIFFERENTIAL/PLATELET
Abs Immature Granulocytes: 0.03 10*3/uL (ref 0.00–0.07)
Basophils Absolute: 0 10*3/uL (ref 0.0–0.1)
Basophils Relative: 0 %
Eosinophils Absolute: 0.1 10*3/uL (ref 0.0–0.5)
Eosinophils Relative: 1 %
HCT: 41.4 % (ref 39.0–52.0)
Hemoglobin: 13.5 g/dL (ref 13.0–17.0)
Immature Granulocytes: 1 %
Lymphocytes Relative: 21 %
Lymphs Abs: 0.8 10*3/uL (ref 0.7–4.0)
MCH: 30.5 pg (ref 26.0–34.0)
MCHC: 32.6 g/dL (ref 30.0–36.0)
MCV: 93.5 fL (ref 80.0–100.0)
Monocytes Absolute: 0.5 10*3/uL (ref 0.1–1.0)
Monocytes Relative: 12 %
Neutro Abs: 2.6 10*3/uL (ref 1.7–7.7)
Neutrophils Relative %: 65 %
Platelets: 109 10*3/uL — ABNORMAL LOW (ref 150–400)
RBC: 4.43 MIL/uL (ref 4.22–5.81)
RDW: 12.8 % (ref 11.5–15.5)
WBC: 4 10*3/uL (ref 4.0–10.5)
nRBC: 0 % (ref 0.0–0.2)

## 2019-09-13 LAB — HEMOGLOBIN A1C
Hgb A1c MFr Bld: 6.3 % — ABNORMAL HIGH (ref 4.8–5.6)
Mean Plasma Glucose: 134.11 mg/dL

## 2019-09-13 LAB — COMPREHENSIVE METABOLIC PANEL
ALT: 60 U/L — ABNORMAL HIGH (ref 0–44)
AST: 82 U/L — ABNORMAL HIGH (ref 15–41)
Albumin: 3.8 g/dL (ref 3.5–5.0)
Alkaline Phosphatase: 39 U/L (ref 38–126)
Anion gap: 10 (ref 5–15)
BUN: 13 mg/dL (ref 8–23)
CO2: 22 mmol/L (ref 22–32)
Calcium: 8.9 mg/dL (ref 8.9–10.3)
Chloride: 108 mmol/L (ref 98–111)
Creatinine, Ser: 0.76 mg/dL (ref 0.61–1.24)
GFR calc Af Amer: >60 mL/min (ref >60)
GFR calc non Af Amer: >60 mL/min (ref >60)
Glucose, Bld: 99 mg/dL (ref 70–99)
Potassium: 3.3 mmol/L — ABNORMAL LOW (ref 3.5–5.1)
Sodium: 140 mmol/L (ref 135–145)
Total Bilirubin: 0.8 mg/dL (ref 0.3–1.2)
Total Protein: 7.2 g/dL (ref 6.5–8.1)

## 2019-09-13 LAB — FERRITIN: Ferritin: 618 ng/mL — ABNORMAL HIGH (ref 24–336)

## 2019-09-13 LAB — HIV ANTIBODY (ROUTINE TESTING W REFLEX): HIV Screen 4th Generation wRfx: NONREACTIVE

## 2019-09-13 LAB — ABO/RH: ABO/RH(D): B POS

## 2019-09-13 LAB — D-DIMER, QUANTITATIVE: D-Dimer, Quant: 0.33 ug/mL-FEU (ref 0.00–0.50)

## 2019-09-13 MED ORDER — BACLOFEN 10 MG PO TABS
20.0000 mg | ORAL_TABLET | Freq: Four times a day (QID) | ORAL | Status: DC
  Administered 2019-09-13 – 2019-09-17 (×16): 20 mg via ORAL
  Filled 2019-09-13 (×4): qty 2
  Filled 2019-09-13: qty 1
  Filled 2019-09-13: qty 2
  Filled 2019-09-13 (×3): qty 1
  Filled 2019-09-13 (×4): qty 2
  Filled 2019-09-13 (×2): qty 1
  Filled 2019-09-13 (×2): qty 2
  Filled 2019-09-13 (×3): qty 1
  Filled 2019-09-13: qty 2

## 2019-09-13 MED ORDER — GERHARDT'S BUTT CREAM
TOPICAL_CREAM | Freq: Three times a day (TID) | CUTANEOUS | Status: DC
  Administered 2019-09-13 – 2019-09-14 (×4): via TOPICAL
  Administered 2019-09-15: 1 via TOPICAL
  Administered 2019-09-15 – 2019-09-17 (×6): via TOPICAL
  Filled 2019-09-13: qty 1

## 2019-09-13 MED ORDER — DIAZEPAM 2 MG PO TABS
2.0000 mg | ORAL_TABLET | Freq: Two times a day (BID) | ORAL | Status: DC
  Administered 2019-09-13 – 2019-09-17 (×9): 2 mg via ORAL
  Filled 2019-09-13 (×9): qty 1

## 2019-09-13 MED ORDER — LOPERAMIDE HCL 2 MG PO CAPS
2.0000 mg | ORAL_CAPSULE | ORAL | Status: DC | PRN
  Administered 2019-09-13 – 2019-09-15 (×3): 2 mg via ORAL
  Filled 2019-09-13 (×3): qty 1

## 2019-09-13 MED ORDER — POTASSIUM CHLORIDE CRYS ER 20 MEQ PO TBCR
40.0000 meq | EXTENDED_RELEASE_TABLET | Freq: Once | ORAL | Status: AC
  Administered 2019-09-13: 08:00:00 40 meq via ORAL
  Filled 2019-09-13: qty 2

## 2019-09-13 MED ORDER — OXYCODONE-ACETAMINOPHEN 5-325 MG PO TABS
1.5000 | ORAL_TABLET | Freq: Three times a day (TID) | ORAL | Status: DC | PRN
  Administered 2019-09-16: 1.5 via ORAL
  Filled 2019-09-13: qty 2

## 2019-09-13 MED ORDER — LABETALOL HCL 100 MG PO TABS
300.0000 mg | ORAL_TABLET | Freq: Three times a day (TID) | ORAL | Status: DC
  Administered 2019-09-13 – 2019-09-17 (×12): 300 mg via ORAL
  Filled 2019-09-13 (×12): qty 3

## 2019-09-13 NOTE — Progress Notes (Signed)
OT Treatment Note  L palm guard positioned. Nursing educated about use of Interdry for L elbow and digits. B prevalon boots ordered. Air mattress ordered. Will follow acutely to establish positioning and hand hygiene program.   09/13/19 1749  OT Visit Information  Last OT Received On 09/13/19  Assistance Needed +2  History of Present Illness 63 y.o. male with a history of ICH, cirrhosis, asthma, and diastolic heart failure among others who presented to [facility] after falling out of bed onto the floor. He is bed bound after ICH with left-sided contractures, also history of cocaine use. In the ED he was noted to be hypoxic to 89% on room air and febrile to 101.41F  with positive SARS-CoV-2 though no infiltrates on CXR.  Precautions  Precautions Fall  Precaution Comments significant contractures L LE  Required Braces or Orthoses Other Brace (L palm guard; asked for B prevalon boots)  Pain Assessment  Pain Assessment Faces  Faces Pain Scale 4  Pain Location wiht LUE ROM  Pain Descriptors / Indicators Grimacing;Discomfort  Pain Intervention(s) Limited activity within patient's tolerance  Cognition  Arousal/Alertness Awake/alert  Behavior During Therapy Flat affect  Overall Cognitive Status History of cognitive impairments - at baseline  General Comments  General comments (skin integrity, edema, etc.) Interdry ordered. Educated nursing on correct positionoing and use of Interdry to L elbow and L digits; L palm protector positioned  OT - End of Session  Activity Tolerance Patient tolerated treatment well  Patient left in bed;with call bell/phone within reach;with bed alarm set  Nurse Communication Other (comment) (use of Interdry, palm protector and B prevalon boots)  OT Assessment/Plan  OT Plan Discharge plan remains appropriate  OT Visit Diagnosis Muscle weakness (generalized) (M62.81);Pain  Pain - Right/Left Left  Pain - part of body Hand;Arm  OT Frequency (ACUTE ONLY) Min 2X/week   Follow Up Recommendations SNF;Supervision/Assistance - 24 hour  OT Equipment None recommended by OT  AM-PAC OT "6 Clicks" Daily Activity Outcome Measure (Version 2)  Help from another person eating meals? 3  Help from another person taking care of personal grooming? 3  Help from another person toileting, which includes using toliet, bedpan, or urinal? 1  Help from another person bathing (including washing, rinsing, drying)? 2  Help from another person to put on and taking off regular upper body clothing? 2  Help from another person to put on and taking off regular lower body clothing? 1  6 Click Score 12  OT Goal Progression  Progress towards OT goals Progressing toward goals  Acute Rehab OT Goals  Patient Stated Goal to get better  OT Goal Formulation With patient  Time For Goal Achievement 09/27/19  Potential to Achieve Goals Good  ADL Goals  Additional ADL Goal #1 Pt will demonstrate decreased redness and moisutre associated skin damage L elbow and hand with use of Interdry and hygiene practices  Additional ADL Goal #2 Pt will tolerate L palm guard to improve hand hygiene  OT Time Calculation  OT Start Time (ACUTE ONLY) 1600  OT Stop Time (ACUTE ONLY) 1615  OT Time Calculation (min) 15 min  OT General Charges  $OT Visit 1 Visit  OT Treatments  $Therapeutic Activity 8-22 mins  Maurie Boettcher, OT/L   Acute OT Clinical Specialist [place] [phone] Office [phone]

## 2019-09-13 NOTE — NC FL2 (Signed)
[place] LEVEL OF CARE SCREENING TOOL     IDENTIFICATION  Patient Name: Mike Rowe Birthdate: 1956-11-10 Sex: male Admission Date (Current Location): 09/12/2019  [facility] and [place] Number:  Herbalist and Address:  ([place])      Provider Number: [id number]  Attending Physician Name and Address:  [name], MD  Relative Name and Phone Number:       Current Level of Care: Hospital Recommended Level of Care: [place] Prior Approval Number:    Date Approved/Denied:   PASRR Number:    Discharge Plan: SNF    Current Diagnoses: Patient Active Problem List   Diagnosis Date Noted  . Acute respiratory disease due to COVID-19 virus 09/12/2019  . Encounter for colonoscopy due to history of adenomatous colonic polyps   . Benign neoplasm of ascending colon   . Benign neoplasm of sigmoid colon   . Colon cancer screening   . Benign neoplasm of rectum   . Cirrhosis of liver without ascites ([place])   . Protein-calorie malnutrition, severe ([place]) 06/29/2015  . Peritonitis ([place]) 06/28/2015  . PEG tube malfunction ([place]) 06/28/2015  . Dysphagia   . Aspiration into airway   . Encounter for feeding tube placement   . Stroke due to intracerebral hemorrhage ([place])   . Tobacco abuse   . Cocaine abuse ([place])   . HTN (hypertension), malignant   . Elevated liver enzymes   . 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 Altered level of consciousness   . Atelectasis   . Acute respiratory failure with hypoxia ([place])   . Alcohol dependence with uncomplicated withdrawal ([place])   . ICH (intracerebral hemorrhage) ([place]) 06/05/2015    Orientation RESPIRATION BLADDER Height & Weight     Self, Time, Situation, Place  Normal Incontinent Weight: 184 lb 11.9 oz (83.8 kg) Height:  6' (182.9 cm)  BEHAVIORAL SYMPTOMS/MOOD NEUROLOGICAL BOWEL NUTRITION STATUS      Incontinent Diet(see dc summary)  AMBULATORY STATUS  COMMUNICATION OF NEEDS Skin   Total Care Verbally Skin abrasions                       Personal Care Assistance Level of Assistance  Bathing, Feeding, Dressing Bathing Assistance: Maximum assistance Feeding assistance: Limited assistance Dressing Assistance: Maximum assistance     Functional Limitations Info  Sight, Hearing, Speech Sight Info: Adequate Hearing Info: Adequate Speech Info: Adequate    SPECIAL CARE FACTORS FREQUENCY                       Contractures Contractures Info: Present    Additional Factors Info  Psychotropic Code Status Info: Full Allergies Info: NKA Psychotropic Info: Celexa         Current Medications (09/13/2019):  This is the current hospital active medication list Current Facility-Administered Medications  Medication Dose Route Frequency Provider Last Rate Last Dose  . 0.9 %  sodium chloride infusion  250 mL Intravenous PRN [name], MD   Stopped at 09/13/19 1100  . acetaminophen (TYLENOL) tablet 650 mg  650 mg Oral Q6H PRN [name], MD      . albuterol (VENTOLIN HFA) 108 (90 Base) MCG/ACT inhaler 2 puff  2 puff Inhalation Q4H PRN [name], MD   2 puff at 09/12/19 2049  . amLODipine (NORVASC) tablet 5 mg  5 mg Oral Daily [name], MD   5 mg at  09/13/19 0947  . baclofen (LIORESAL) tablet 20 mg  20 mg Oral QID [name], MD   20 mg at 09/13/19 1508  . cefTRIAXone (ROCEPHIN) 2 g in sodium chloride 0.9 % 100 mL IVPB  2 g Intravenous Q24H [name], MD   Stopped at 09/13/19 1016  . chlorpheniramine-HYDROcodone (TUSSIONEX) 10-8 MG/5ML suspension 5 mL  5 mL Oral Q12H PRN [name], MD      . cholecalciferol (VITAMIN D3) tablet 2,000 Units  2,000 Units Oral Daily [name], MD   2,000 Units at 09/13/19 [phone]  . citalopram (CELEXA) tablet 20 mg  20 mg Oral Daily [name], MD   20 mg at 09/13/19 0946  . diazepam (VALIUM) tablet 2 mg  2 mg Oral BID [name], MD   2 mg at 09/13/19 1508  . enoxaparin  (LOVENOX) injection 40 mg  40 mg Subcutaneous Q24H [name], MD   40 mg at 09/12/19 2033  . fenofibrate tablet 160 mg  160 mg Oral Daily [name], MD   160 mg at 09/13/19 0947  . fluticasone (FLONASE) 50 MCG/ACT nasal spray 1 spray  1 spray Each Nare Daily [name], MD   1 spray at 09/13/19 0946  . guaiFENesin-dextromethorphan (ROBITUSSIN DM) 100-10 MG/5ML syrup 10 mL  10 mL Oral Q4H PRN [name], MD   10 mL at 09/12/19 2033  . labetalol (NORMODYNE) tablet 300 mg  300 mg Oral TID [name], MD      . MEDLINE mouth rinse  15 mL Mouth Rinse BID [name], MD   15 mL at 09/13/19 0948  . mometasone-formoterol (DULERA) 200-5 MCG/ACT inhaler 2 puff  2 puff Inhalation BID [name], MD   2 puff at 09/13/19 0754  . ondansetron (ZOFRAN) tablet 4 mg  4 mg Oral Q6H PRN [name], MD       Or  . ondansetron [facility]) injection 4 mg  4 mg Intravenous Q6H PRN [name], MD      . oxyCODONE-acetaminophen (PERCOCET/ROXICET) 5-325 MG per tablet 1.5 tablet  1.5 tablet Oral Q8H PRN [name], MD      . pantoprazole (PROTONIX) EC tablet 40 mg  40 mg Oral Daily [name], MD   40 mg at 09/13/19 0947  . remdesivir 100 mg in sodium chloride 0.9 % 250 mL IVPB  100 mg Intravenous Q24H Vance Gather B, MD      . sodium chloride flush (NS) 0.9 % injection 3 mL  3 mL Intravenous Q12H [name], MD   3 mL at 09/12/19 2034  . sodium chloride flush (NS) 0.9 % injection 3 mL  3 mL Intravenous Q12H [name], MD   3 mL at 09/12/19 2034  . sodium chloride flush (NS) 0.9 % injection 3 mL  3 mL Intravenous PRN [name], MD   3 mL at 09/13/19 0946  . tamsulosin (FLOMAX) capsule 0.4 mg  0.4 mg Oral Daily Vance Gather B, MD   0.4 mg at 09/13/19 0947  . vitamin C (ASCORBIC ACID) tablet 500 mg  500 mg Oral Daily [name], MD   500 mg at 09/13/19 0947  . zinc sulfate capsule 220 mg  220 mg Oral Daily [name], MD   220 mg at 09/13/19 [id number]     Discharge  Medications: Please see discharge summary for a list of  discharge medications.  Relevant Imaging Results:  Relevant Lab Results:   Additional Information    Shade Flood, LCSW

## 2019-09-13 NOTE — Plan of Care (Signed)
Pt slept intermittently during the night. No complaints of pain verbalized. Alert and oriented. Vitals stable, O2 weaned of to RA. No dyspnea;  dry  cough noted, prn Robitussin given. Dependent with ADLs. Due to L hemiparesis and contractures. Incontinent of urine, condom cath in place. Skin assessed, dry abrasions noted on B leg and B knee. Foam dressing applied on L posterior thigh - scab noted, area intact. Assisted regular repositioning for pressure relief. Unable to tolerate proning. Remdesivir and incentive spirometry exercises started. No other issues, will monitor.   Problem: Clinical Measurements: Goal: Ability to maintain clinical measurements within normal limits will improve Outcome: Progressing Goal: Will remain free from infection Outcome: Progressing Goal: Diagnostic test results will improve Outcome: Progressing Goal: Respiratory complications will improve Outcome: Progressing Goal: Cardiovascular complication will be avoided Outcome: Progressing   Problem: Skin Integrity: Goal: Risk for impaired skin integrity will decrease Outcome: Progressing

## 2019-09-13 NOTE — Evaluation (Signed)
Clinical/Bedside Swallow Evaluation Patient Details  Name: Mike Rowe MRN: [id number] Date of Birth: [birth year]  Today's Date: 09/13/2019 Time: SLP Start Time (ACUTE ONLY): 1439 SLP Stop Time (ACUTE ONLY): 1451 SLP Time Calculation (min) (ACUTE ONLY): 12 min  Past Medical History:  Past Medical History:  Diagnosis Date  . Allergy   . Anxiety   . Asthma   . Cerebral infarction ([place])   . CHF (congestive heart failure) (HCC)    Diastolic Heart failure per progress note from [facility] 02/16/18  . Contracture of ankle, left    per progress note from [facility]-02/16/2018  . Contracture of hip, left    per progress note from [facility] 02/16/2018  . Contracture of knee joint, right 10/09/2017  . Diabetes ([place])   . Dysphagia   . GERD (gastroesophageal reflux disease)    per progress note from [facility] 02/16/2018  . 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 Hepatitis C virus    Hepatitis C  . Hypertension   . Major depressive disorder   . Stroke ([place]) 06/27/2015   cerebral infarction   . Substance abuse [facility])    Past Surgical History:  Past Surgical History:  Procedure Laterality Date  . COLONOSCOPY WITH PROPOFOL N/A 12/13/2016   Procedure: COLONOSCOPY WITH PROPOFOL;  Surgeon: Jerene Bears, MD;  Location: [facility];  Service: Gastroenterology;  Laterality: N/A;  . COLONOSCOPY WITH PROPOFOL N/A 04/03/2018   Procedure: COLONOSCOPY WITH PROPOFOL;  Surgeon: Jerene Bears, MD;  Location: [facility];  Service: Gastroenterology;  Laterality: N/A;  . ESOPHAGOGASTRODUODENOSCOPY (EGD) WITH PROPOFOL N/A 12/13/2016   Procedure: ESOPHAGOGASTRODUODENOSCOPY (EGD) WITH PROPOFOL;  Surgeon: Jerene Bears, MD;  Location: [facility];  Service: Gastroenterology;  Laterality: N/A;  . LAPAROTOMY N/A 06/28/2015   Procedure:  EXPLORATORY LAPAROTOMY;  Surgeon: Georganna Skeans, MD;  Location: [place];  Service: General;  Laterality: N/A;  . PEG PLACEMENT N/A 06/25/2015   Procedure: PERCUTANEOUS ENDOSCOPIC GASTROSTOMY (PEG) PLACEMENT;  Surgeon: Georganna Skeans, MD;  Location: [place];  Service: General;  Laterality: N/A;  . PEG PLACEMENT N/A 06/28/2015   Procedure: PERCUTANEOUS ENDOSCOPIC GASTROSTOMY (PEG) PLACEMENT;  Surgeon: Georganna Skeans, MD;  Location: [place];  Service: General;  Laterality: N/A;  . POLYPECTOMY  04/03/2018   Procedure: POLYPECTOMY;  Surgeon: Jerene Bears, MD;  Location: [facility];  Service: Gastroenterology;;   HPI:  63 y.o. male, resident of SNF, with a history of [place] in August 2016 with residual left hemiparesis, cirrhosis, asthma, and diastolic heart failure among others who presented to [place] after falling out of bed onto the floor. He is bed bound after ICH, also history of cocaine use. In the ED he was noted to be hypoxic to 89% on room air and febrile to 101.65F  with positive SARS-CoV-2 though no infiltrates on CXR. Pt with remote hx of dysphagia/PEG.    Assessment / Plan / Recommendation Clinical Impression  Pt participated in limited clinical swallow assessment due to his concerns  about diarrhea and PO intake exacerbating his discomfort. He presented with normal oral mechanism exam.  RR well within parameters for adequate swallow/respiratory synchrony.  No s/s of aspiration noted with three oz water.  Pt declined any solid foods.  There are no obvious concerns for an acute dysphagia - recommend continuing current diet per pt's preferences; meds whole in liquid.  No SLP f/u is needed.  SLP Visit Diagnosis: Dysphagia, oropharyngeal phase (R13.12)    Aspiration Risk  No limitations    Diet Recommendation   continue soft diet, thin liquids  Medication Administration: Whole meds with liquid    Other  Recommendations Oral Care Recommendations: Oral care BID   Follow up Recommendations None       Frequency and Duration   n/a         Prognosis   n/a     Swallow Study   General HPI: 63 y.o. male, resident of SNF, with a history of [place] in August 2016 with residual left hemiparesis, cirrhosis, asthma, and diastolic heart failure among others who presented to [place] after falling out of bed onto the floor. He is bed bound after ICH, also history of cocaine use. In the ED he was noted to be hypoxic to 89% on room air and febrile to 101.44F  with positive SARS-CoV-2 though no infiltrates on CXR. Pt with remote hx of dysphagia/PEG.  Type of Study: Bedside Swallow Evaluation Previous Swallow Assessment: see HPI Diet Prior to this Study: Dysphagia 3 (soft);Thin liquids Temperature Spikes Noted: Yes Respiratory Status: Room air History of Recent Intubation: No Behavior/Cognition: Alert Oral Cavity Assessment: Within Functional Limits Oral Cavity - Dentition: Missing dentition Vision: Functional for self-feeding Self-Feeding Abilities: Able to feed self Patient Positioning: Upright in bed Baseline Vocal Quality: Normal Volitional Cough: Strong Volitional Swallow: Able to elicit    Oral/Motor/Sensory Function Overall Oral Motor/Sensory Function: Within functional limits   Ice Chips Ice chips: Within functional limits   Thin Liquid Thin Liquid: Within functional limits    Nectar Thick Nectar Thick Liquid: Not tested   Honey Thick Honey Thick Liquid: Not tested   Puree Puree: Not tested   Solid     Solid: Not tested      Mike Rowe 09/13/2019,2:53 PM  Estill Bamberg L. Tivis Ringer, [place] Office number [phone] Pager [phone]

## 2019-09-13 NOTE — TOC Initial Note (Signed)
Transition of Care [facility]) - Initial/Assessment Note    Patient Details  Name: Mike Rowe MRN: [id number] Date of Birth: 03/13/1956  Transition of Care [facility]) CM/SW Contact:    Shade Flood, LCSW Phone Number: 09/13/2019, 3:46 PM  Clinical Narrative:                  Pt admitted from [facility] in [place]. Pt is a long term care resident there. Spoke with Admissions at the SNF and pt will be able to return there when he is medically stable for dc.  Pt has a legal guardian through [facility]. Her number is on the demographic sheet. Unable to reach her today.   TOC will follow and assist with plans to return to [place] at dc.  Expected Discharge Plan: Long Term Nursing Home Barriers to Discharge: Continued Medical Work up   Patient Goals and CMS Choice        Expected Discharge Plan and Services Expected Discharge Plan: [place] In-house Referral: Clinical Social Work     Living arrangements for the past 2 months: [place]                                      Prior Living Arrangements/Services Living arrangements for the past 2 months: [place] Lives with:: Facility Resident Patient language and need for interpreter reviewed:: Yes Do you feel safe going back to the place where you live?: Yes      Need for Family Participation in Patient Care: No (Comment) Care giver support system in place?: Yes (comment)   Criminal Activity/Legal Involvement Pertinent to Current Situation/Hospitalization: No - Comment as needed  Activities of Daily Living Home Assistive Devices/Equipment: Eyeglasses ADL Screening (condition at time of admission) Patient's cognitive ability adequate to safely complete daily activities?: No Is the patient deaf or have difficulty hearing?: No Does the patient have difficulty seeing, even when wearing glasses/contacts?: No Does the patient have difficulty concentrating,  remembering, or making decisions?: No Patient able to express need for assistance with ADLs?: Yes Does the patient have difficulty dressing or bathing?: Yes Independently performs ADLs?: No Communication: Independent Dressing (OT): Dependent Is this a change from baseline?: Pre-admission baseline Grooming: Dependent Is this a change from baseline?: Pre-admission baseline Feeding: Needs assistance Is this a change from baseline?: Pre-admission baseline Bathing: Dependent Is this a change from baseline?: Pre-admission baseline Toileting: Dependent Is this a change from baseline?: Pre-admission baseline In/Out Bed: Dependent Is this a change from baseline?: Pre-admission baseline Walks in Home: Dependent Is this a change from baseline?: Pre-admission baseline Does the patient have difficulty walking or climbing stairs?: Yes Weakness of Legs: Left Weakness of Arms/Hands: Left  Permission Sought/Granted                  Emotional Assessment       Orientation: : Oriented to Self, Oriented to Place, Oriented to  Time, Oriented to Situation Alcohol / Substance Use: Not Applicable Psych Involvement: No (comment)  Admission diagnosis:  HYPOXIA COVID 19 VIRUS INFECTION Patient Active Problem List   Diagnosis Date Noted  . Acute respiratory disease due to COVID-19 virus 09/12/2019  . Encounter for colonoscopy due to history of adenomatous colonic polyps   . Benign neoplasm of ascending colon   . Benign neoplasm of sigmoid colon   . Colon cancer screening   .  Benign neoplasm of rectum   . Cirrhosis of liver without ascites ([place])   . Protein-calorie malnutrition, severe ([place]) 06/29/2015  . Peritonitis ([place]) 06/28/2015  . PEG tube malfunction ([place]) 06/28/2015  . Dysphagia   . Aspiration into airway   . Encounter for feeding tube placement   . Stroke due to intracerebral hemorrhage ([place])   . Tobacco abuse   . Cocaine abuse ([place])   . HTN (hypertension), malignant   .  Elevated liver enzymes   . Nontraumatic subcortical hemorrhage of cerebral hemisphere ([place])   . Altered level of consciousness   . Atelectasis   . Acute respiratory failure with hypoxia ([place])   . Alcohol dependence with uncomplicated withdrawal ([place])   . ICH (intracerebral hemorrhage) ([place]) 06/05/2015   PCP:  Bonnita Nasuti, MD Pharmacy:   [facility], [address] [place] [address] Phone: [phone] Fax: [phone]     Social Determinants of Health (SDOH) Interventions    Readmission Risk Interventions Readmission Risk Prevention Plan 09/13/2019  Medication Screening Complete  Transportation Screening Complete  Some recent data might be hidden

## 2019-09-13 NOTE — Evaluation (Signed)
Physical Therapy Evaluation Patient Details Name: Mike Rowe MRN: [id number] DOB: 10-04-1956 Today's Date: 09/13/2019   History of Present Illness  63 y.o. male with a history of ICH, cirrhosis, asthma, and diastolic heart failure among others who presented to [place] after falling out of bed onto the floor. He is bed bound after ICH with left-sided contractures, also history of cocaine use. In the ED he was noted to be hypoxic to 89% on room air and febrile to 101.45F  with positive SARS-CoV-2 though no infiltrates on CXR.  Clinical Impression  The patient requires assistance for all aspects of mobility at baseline. LLE with severe flexion contracture of long standing. Recommend air mattress and bilateral Prevalon boots for pressure reduction. Unfortunately, patient is at baseline for functional status, requiring asisstance. PT will sign off.     Follow Up Recommendations No PT follow up(return to SNF)    Equipment Recommendations  None recommended by PT    Recommendations for Other Services       Precautions / Restrictions Precautions Precautions: Fall Precaution Comments: significant contractures L LE      Mobility  Bed Mobility Overal bed mobility: Needs Assistance Bed Mobility: Rolling Rolling: +2 for physical assistance;Max assist         General bed mobility comments: Pt assisted by pulling on rails uisng R UE  Transfers                 General transfer comment: Not attmepted  Ambulation/Gait                Stairs            Wheelchair Mobility    Modified Rankin (Stroke Patients Only)       Balance                                             Pertinent Vitals/Pain Pain Assessment: Faces Faces Pain Scale: Hurts little more Pain Location: General discomfort with ROM;contractures Pain Descriptors / Indicators: Grimacing;Discomfort Pain Intervention(s): Monitored during session;Repositioned    Home Living  Family/patient expects to be discharged to:: Skilled nursing facility                      Prior Function Level of Independence: Needs assistance   Gait / Transfers Assistance Needed: non ambulatory  ADL's / Homemaking Assistance Needed: staff assists with ADL, pt feeds self after set up        Hand Dominance   Dominant Hand: Right    Extremity/Trunk Assessment   Upper Extremity Assessment Upper Extremity Assessment: Defer to OT evaluation LUE Deficits / Details: LUE contracted wiht elbow tightly held into flexion with maceration noted in fossa; wirst flexed with fingers contractedinto flexionscissoring of index finger over middle with maceration noted. poor hand hygiene unable to rnage beyond 90 of MPs; IPS contracted into flexion; non-functional LUE Coordination: decreased fine motor;decreased gross motor    Lower Extremity Assessment Lower Extremity Assessment: LLE deficits/detail;RLE deficits/detail RLE Deficits / Details: does move the leg volitionally for  rolling LLE Deficits / Details: flexion contractures at hip and knee to point heel is near periarea,  equinovarus ankle contracture    Cervical / Trunk Assessment Cervical / Trunk Assessment: Other exceptions Cervical / Trunk Exceptions: kyphotic and roated  Communication   Communication: (ansers questions simply)  Cognition Arousal/Alertness: Awake/alert Behavior During  Therapy: Flat affect Overall Cognitive Status: History of cognitive impairments - at baseline                                        General Comments      Exercises Other Exercises Other Exercises: incentive spriometer x 10 - ableto pull 700 ml   Assessment/Plan    PT Assessment Patent does not need any further PT services  PT Problem List         PT Treatment Interventions      PT Goals (Current goals can be found in the Care Plan section)  Acute Rehab PT Goals Patient Stated Goal: to get better PT Goal  Formulation: All assessment and education complete, DC therapy    Frequency     Barriers to discharge        Co-evaluation PT/OT/SLP Co-Evaluation/Treatment: Yes Reason for Co-Treatment: For patient/therapist safety PT goals addressed during session: Mobility/safety with mobility OT goals addressed during session: ADL's and self-care       AM-PAC PT "6 Clicks" Mobility  Outcome Measure Help needed turning from your back to your side while in a flat bed without using bedrails?: Total Help needed moving from lying on your back to sitting on the side of a flat bed without using bedrails?: Total Help needed moving to and from a bed to a chair (including a wheelchair)?: Total Help needed standing up from a chair using your arms (e.g., wheelchair or bedside chair)?: Total Help needed to walk in hospital room?: Total Help needed climbing 3-5 steps with a railing? : Total 6 Click Score: 6    End of Session   Activity Tolerance: Patient tolerated treatment well Patient left: in bed;with call bell/phone within reach Nurse Communication: Mobility status PT Visit Diagnosis: Other symptoms and signs involving the nervous system (R29.898);Hemiplegia and hemiparesis Hemiplegia - Right/Left: Left Hemiplegia - dominant/non-dominant: Non-dominant Hemiplegia - caused by: Other Nontraumatic intracranial hemorrhage    Time: 1220-1248 PT Time Calculation (min) (ACUTE ONLY): 28 min   Charges:   PT Evaluation $PT Eval Moderate Complexity: [place]  Office [phone]   Claretha Cooper 09/13/2019, 5:23 PM

## 2019-09-13 NOTE — Progress Notes (Addendum)
PROGRESS NOTE  Mike Rowe  [id number] DOB: [birth year] DOA: 09/12/2019 PCP: Bonnita Nasuti, MD   Brief Narrative: Mike Rowe is a 63 y.o. male with a history of ICH, cirrhosis, asthma, and diastolic heart failure among others who presented to [facility] after falling out of bed onto the floor. He is bed bound after ICH with left-sided contractures, also history of cocaine use. In the ED he was noted to be hypoxic to 89% on room air and febrile to 101.65F  with positive SARS-CoV-2 though no infiltrates on CXR. He had no deformities on exam. Pelvis XR negative. Urinalysis suggested UTI for which ceftriaxone and tylenol were given and admission to [facility] was requested. Due to hypoxia, remdesivir was started and ceftriaxone was continued.  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  - Continue remdesivir 10/29 - 11/2 - Holding steroids given the presence of bacterial infection. If hypoxia progresses, or wheezing is noted, however, would need to start steroids.  - No indication to give convalescent plasma at this time.  - Continue airborne, contact precautions. PPE including surgical gown, gloves, cap, shoe covers, and CAPR used during this encounter in a negative pressure room.  - Check daily labs: CBC w/diff, CMP, d-dimer, ferritin, CRP. Inflammatory markers roughly stable. - Enoxaparin standard prophylactic dose. D-dimer noted to be mildly elevated c/w covid.   - Maintain euvolemia/net negative.  - Avoid NSAIDs - Recommend proning and aggressive use of incentive spirometry. - Goals of care were discussed.  Prognosis is guarded. Full code confirmed.   UTI:  - Monitor urine culture sent from [facility], no report yet. - Continue ceftriaxone daily. No leukocytosis.  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 Chronically elevated LFTs noted, INR reassuringly 1.1 and albumin 4.0, suggesting compensation  - Monitor LFTs while on remdesivier, holding statin for now.  - Avoid hepatotoxins.    Prediabetes: HbA1c 6.3%, previously 5.2% in 2016. Fasting CBG wnl.  - Monitor on daily labs for hyperglycemia.   HFpEF:  - Hold lasix (which is not on facility [facility]) as he's not overloaded by BNP, CXR, or exam.  - Monitor I/O, daily weights  HTN:  - Norvasc 5mg  reordered. Lisinopril and labetalol not ordered as they were not on facility MAR  HLD:  - Continue fenofibrate 160mg , hold statin for now with elevated LFTs  Anxiety disorder, major depressive disorder:  - Continue citalopram 20mg . Has valium on MAR here, not on facility [facility], so will hold for now.   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 - PT/OT. Due to hx dysphagia and even requiring PEG tube in the past, will get SLP evaluation, pending. Soft mechanical diet pending this evaluation.   Moderate persistent asthma, history of tobacco use: No evidence of exacerbation but has noticed some wheezing PTA.  - Scheduled combivent, formulary equivalent for symbicort - Prn albuterol  GERD:  - PPI  BPH:  - Monitor UOP closely, no significant post void residuals noted.  Suspect this predisposes to UTIs.  - Continue flomax  Constipation:  - Continue miralax, colace scheduled daily  DVT prophylaxis: Lovenox Code Status: Full Family Communication: None at bedside, declined offer to call. Disposition Plan: Return to SNF  once stabilized and completed remdesivir  Consultants:   None  Procedures:   None  Antimicrobials:  Remdesivir 10/29 - 11/2   Subjective: Shortness of breath is about the same, worse with movement or coughing, but not on oxygen this morning. No chest pain or abdominal pain currently.   Objective: Vitals:   09/13/19 0419 09/13/19 0428 09/13/19 0800 09/13/19 0900  BP:  138/83 (!) 147/92   Pulse: 79 75 82   Resp:  16    Temp:  99.2 F (37.3 C) 99.5 F (37.5 C)   TempSrc:  Oral Oral   SpO2: 95% 92% 91% (!) 88%  Weight:      Height:        Intake/Output Summary (Last 24 hours) at 09/13/2019 1100 Last data filed at 09/13/2019 0910 Gross per 24 hour  Intake 975 ml  Output 500 ml  Net 475 ml   Filed Weights   09/12/19 1700 09/13/19 0112  Weight: 82 kg 83.8 kg    Gen: Chronically ill-appearing in no distress Pulm: Non-labored breathing room air. Crackles bilaterally.  CV: Regular rate and rhythm. No murmur, rub, or gallop. No JVD, no pedal edema. GI: Abdomen soft, protuberant, non-tender, non-distended, with normoactive bowel sounds. No organomegaly or masses felt. Ext: Warm, LE and LUE contractures stable, decreased muscle bulk, increased tone. Skin: No new lesions on visualized skin, scattered dry abrasions unchanged.  Neuro: Alert and oriented. Stable focal neurological deficits. Psych: Judgement and insight appear fair. Mood & affect appropriate.   Data Reviewed: I have personally reviewed following labs and imaging studies  CBC: Recent Labs  Lab 09/13/19 0047  WBC 4.0  NEUTROABS 2.6  HGB 13.5  HCT 41.4  MCV 93.5  PLT 0000000*   Basic Metabolic Panel: Recent Labs  Lab 09/13/19 0047  NA 140  K 3.3*  CL 108  CO2 22  GLUCOSE 99  BUN 13  CREATININE 0.76  CALCIUM 8.9   GFR: Estimated Creatinine Clearance: 102.4 mL/min (by C-G formula based on SCr of 0.76 mg/dL). Liver Function Tests: Recent Labs  Lab 09/13/19 0047  AST 82*  ALT 60*  ALKPHOS 39   BILITOT 0.8  PROT 7.2  ALBUMIN 3.8   No results for input(s): LIPASE, AMYLASE in the last 168 hours. No results for input(s): AMMONIA in the last 168 hours. Coagulation Profile: No results for input(s): INR, PROTIME in the last 168 hours. Cardiac Enzymes: No results for input(s): CKTOTAL, CKMB, CKMBINDEX, TROPONINI in the last 168 hours. BNP (last 3 results) No results for input(s): PROBNP in the last 8760 hours. HbA1C: Recent Labs    09/13/19 0047  HGBA1C 6.3*   CBG: No results for input(s): GLUCAP in the last 168 hours. Lipid Profile: No results for input(s): CHOL, HDL, LDLCALC, TRIG, CHOLHDL, LDLDIRECT in the last 72 hours. Thyroid Function Tests: No results for input(s): TSH, T4TOTAL, FREET4, T3FREE, THYROIDAB in the last 72 hours. Anemia Panel: Recent Labs    09/13/19 0047  FERRITIN 618*   Urine analysis:    Component Value Date/Time   COLORURINE AMBER (A) 06/12/2015 1733   APPEARANCEUR CLOUDY (A) 06/12/2015 1733   LABSPEC 1.030 06/12/2015 1733   PHURINE 5.5 06/12/2015 1733   GLUCOSEU 100 (A) 06/12/2015 1733   HGBUR LARGE (A)  06/12/2015 [place] 06/12/2015 1733   KETONESUR 15 (A) 06/12/2015 1733   PROTEINUR 100 (A) 06/12/2015 1733   UROBILINOGEN 1.0 06/12/2015 1733   NITRITE NEGATIVE 06/12/2015 1733   LEUKOCYTESUR NEGATIVE 06/12/2015 1733   Recent Results (from the past 240 hour(s))  MRSA PCR Screening     Status: None   Collection Time: 09/12/19  8:20 PM   Specimen: Nasal Mucosa; Nasopharyngeal  Result Value Ref Range Status   MRSA by PCR NEGATIVE NEGATIVE Final    Comment:        The GeneXpert MRSA Assay (FDA approved for NASAL specimens only), is one component of a comprehensive MRSA colonization surveillance program. It is not intended to diagnose MRSA infection nor to guide or monitor treatment for MRSA infections. Performed at [facility], [place] [address] [place], [place] [address]       Radiology  Studies: No results found.  Scheduled Meds: . amLODipine  5 mg Oral Daily  . cholecalciferol  2,000 Units Oral Daily  . citalopram  20 mg Oral Daily  . docusate sodium  200 mg Oral Daily  . enoxaparin (LOVENOX) injection  40 mg Subcutaneous Q24H  . fenofibrate  160 mg Oral Daily  . fluticasone  1 spray Each Nare Daily  . mouth rinse  15 mL Mouth Rinse BID  . mometasone-formoterol  2 puff Inhalation BID  . pantoprazole  40 mg Oral Daily  . sodium chloride flush  3 mL Intravenous Q12H  . sodium chloride flush  3 mL Intravenous Q12H  . tamsulosin  0.4 mg Oral Daily  . vitamin C  500 mg Oral Daily  . zinc sulfate  220 mg Oral Daily   Continuous Infusions: . sodium chloride 250 mL (09/13/19 0943)  . cefTRIAXone (ROCEPHIN)  IV 2 g (09/13/19 0945)  . remdesivir 100 mg in NS 250 mL       LOS: 1 day   Time spent: 25 minutes.  [name], MD [facility] [email or web address] 09/13/2019, 11:00 AM

## 2019-09-13 NOTE — Progress Notes (Addendum)
Occupational Therapy Evaluation Patient Details Name: Mike Rowe MRN: [id number] DOB: 26-Aug-1956 Today's Date: 09/13/2019    History of Present Illness 63 y.o. male with a history of ICH, cirrhosis, asthma, and diastolic heart failure among others who presented to [facility] after falling out of bed onto the floor. He is bed bound after ICH with left-sided contractures, also history of cocaine use. In the ED he was noted to be hypoxic to 89% on room air and febrile to 101.14F  with positive SARS-CoV-2 though no infiltrates on CXR.   Clinical Impression   PTA, pt lived at SNF and was essentially bed bound. Pt did feed himself and assisted with grooming and bathing. Pt complained of increased SOB during session (rolling in bed due to incontinent episode); SpO2 in 90s on RA.  Pt with significant contractures LU/LE and RLE. Pt with apparent moisture associated skin damage L anticubital fossae and L webspace areas, especially between index and middle fingers where finger is overlapping. Consulted Verne Spurr WOC nurse via phone. Received verbal order to use Interdry (lot # [id number]) in elbow in addition to lacing between fingers of L hand. Areas should be cleaned and patted dry before Interdry is placed.No cream or powder should be used in addition to Interdry product. A 2 inch section should be left on each side of cloth to wick away moisture. Product can be used for 5 days unless soiled.  Will place palm guard in L hand. Also recommend use of prevalon boots B feet while in bed and replacing mattress with air mattress due to high risk for skin breakdown. Will follow acutely.    Follow Up Recommendations  SNF;Supervision/Assistance - 24 hour    Equipment Recommendations  None recommended by OT    Recommendations for Other Services       Precautions / Restrictions Precautions Precautions: Fall;Other (comment)(at risk for skin breakdown)      Mobility Bed Mobility Overal bed mobility: Needs  Assistance Bed Mobility: Rolling Rolling: +2 for physical assistance;Max assist         General bed mobility comments: Pt assisted by pulling on rails uisng R UE  Transfers                 General transfer comment: Not attmepted    Balance                                           ADL either performed or assessed with clinical judgement   ADL Overall ADL's : Needs assistance/impaired Eating/Feeding: Set up Eating/Feeding Details (indicate cue type and reason): Able to feed self after set up. poor PO intake Grooming: Minimal assistance   Upper Body Bathing: Moderate assistance;Bed level   Lower Body Bathing: Maximal assistance;Bed level               Toileting- Clothing Manipulation and Hygiene: Total assistance Toileting - Clothing Manipulation Details (indicate cue type and reason): pt incontinent of BM and urine. Total A to clean; scrotum with what apears to be moisture associated skin damage. Barrier cream used after Education officer, environmental      Pertinent Vitals/Pain Pain Assessment: Faces Faces Pain Scale: Hurts little more Pain Location: General discomfort with ROM;contractures Pain Descriptors / Indicators: Grimacing;Discomfort  Pain Intervention(s): Limited activity within patient's tolerance;Repositioned     Hand Dominance Right   Extremity/Trunk Assessment Upper Extremity Assessment Upper Extremity Assessment: LUE deficits/detail LUE Deficits / Details: LUE contracted wiht elbow tightly held into flexion with maceration noted in fossa; wirst flexed with fingers contractedinto flexionscissoring of index finger over middle with maceration noted. poor hand hygiene unable to rnage beyond 90 of MPs; IPS contracted into flexion; non-functional LUE Coordination: decreased fine motor;decreased gross motor   Lower Extremity Assessment Lower Extremity Assessment: (BLE contracted into flexed  pattern and internally rotated)   Cervical / Trunk Assessment Cervical / Trunk Assessment: Other exceptions Cervical / Trunk Exceptions: kyphotic and roated   Communication     Cognition Arousal/Alertness: Awake/alert Behavior During Therapy: Flat affect Overall Cognitive Status: History of cognitive impairments - at baseline                                     General Comments       Exercises Exercises: Other exercises Other Exercises Other Exercises: incentive spriometer x 10 - ableto pull 700 ml   Shoulder Instructions      Home Living Family/patient expects to be discharged to:: Skilled nursing facility                                        Prior Functioning/Environment Level of Independence: Needs assistance  Gait / Transfers Assistance Needed: non ambulatory ADL's / Homemaking Assistance Needed: staff assists with ADL, pt feeds self after set up            OT Problem List: Decreased strength;Decreased activity tolerance;Decreased coordination;Decreased cognition;Decreased knowledge of use of DME or AE;Cardiopulmonary status limiting activity;Impaired sensation;Impaired tone;Impaired UE functional use;Pain;Increased edema      OT Treatment/Interventions: Self-care/ADL training;Therapeutic exercise;Therapeutic activities;Patient/family education;Splinting    OT Goals(Current goals can be found in the care plan section) Acute Rehab OT Goals Patient Stated Goal: to get better OT Goal Formulation: With patient Time For Goal Achievement: 09/27/19 Potential to Achieve Goals: Good  OT Frequency: Min 2X/week   Barriers to D/C:            Co-evaluation              AM-PAC OT "6 Clicks" Daily Activity     Outcome Measure Help from another person eating meals?: A Little Help from another person taking care of personal grooming?: A Little Help from another person toileting, which includes using toliet, bedpan, or urinal?:  Total Help from another person bathing (including washing, rinsing, drying)?: A Lot Help from another person to put on and taking off regular upper body clothing?: A Lot Help from another person to put on and taking off regular lower body clothing?: Total 6 Click Score: 12   End of Session Nurse Communication: Mobility status;Other (comment)(need for positioning devices and air mattress)  Activity Tolerance: Patient tolerated treatment well Patient left: in bed;with call bell/phone within reach;with bed alarm set  OT Visit Diagnosis: Muscle weakness (generalized) (M62.81);Pain Pain - Right/Left: Left Pain - part of body: Hand;Arm                Time: [id number] OT Time Calculation (min): 41 min Charges:  OT General Charges $OT Visit: 1 Visit OT Evaluation $OT Eval Moderate Complexity: 1 Mod OT Treatments $Therapeutic Activity: 8-22  mins  Maurie Boettcher, OT/L   Acute OT Clinical Specialist Acute Rehabilitation Services Pager [phone] Office [phone]   [facility] 09/13/2019, 3:00 PM

## 2019-09-14 LAB — COMPREHENSIVE METABOLIC PANEL
ALT: 52 U/L — ABNORMAL HIGH (ref 0–44)
AST: 63 U/L — ABNORMAL HIGH (ref 15–41)
Albumin: 3.9 g/dL (ref 3.5–5.0)
Alkaline Phosphatase: 42 U/L (ref 38–126)
Anion gap: 13 (ref 5–15)
BUN: 12 mg/dL (ref 8–23)
CO2: 20 mmol/L — ABNORMAL LOW (ref 22–32)
Calcium: 9.1 mg/dL (ref 8.9–10.3)
Chloride: 107 mmol/L (ref 98–111)
Creatinine, Ser: 0.7 mg/dL (ref 0.61–1.24)
GFR calc Af Amer: >60 mL/min (ref >60)
GFR calc non Af Amer: >60 mL/min (ref >60)
Glucose, Bld: 83 mg/dL (ref 70–99)
Potassium: 3.5 mmol/L (ref 3.5–5.1)
Sodium: 140 mmol/L (ref 135–145)
Total Bilirubin: 0.8 mg/dL (ref 0.3–1.2)
Total Protein: 7.5 g/dL (ref 6.5–8.1)

## 2019-09-14 LAB — CBC WITH DIFFERENTIAL/PLATELET
Abs Immature Granulocytes: 0.04 10*3/uL (ref 0.00–0.07)
Basophils Absolute: 0 10*3/uL (ref 0.0–0.1)
Basophils Relative: 0 %
Eosinophils Absolute: 0.1 10*3/uL (ref 0.0–0.5)
Eosinophils Relative: 1 %
HCT: 43.1 % (ref 39.0–52.0)
Hemoglobin: 13.9 g/dL (ref 13.0–17.0)
Immature Granulocytes: 1 %
Lymphocytes Relative: 25 %
Lymphs Abs: 1 10*3/uL (ref 0.7–4.0)
MCH: 29.8 pg (ref 26.0–34.0)
MCHC: 32.3 g/dL (ref 30.0–36.0)
MCV: 92.5 fL (ref 80.0–100.0)
Monocytes Absolute: 0.5 10*3/uL (ref 0.1–1.0)
Monocytes Relative: 13 %
Neutro Abs: 2.4 10*3/uL (ref 1.7–7.7)
Neutrophils Relative %: 60 %
Platelets: 144 10*3/uL — ABNORMAL LOW (ref 150–400)
RBC: 4.66 MIL/uL (ref 4.22–5.81)
RDW: 12.8 % (ref 11.5–15.5)
WBC: 4 10*3/uL (ref 4.0–10.5)
nRBC: 0 % (ref 0.0–0.2)

## 2019-09-14 LAB — FERRITIN: Ferritin: 539 ng/mL — ABNORMAL HIGH (ref 24–336)

## 2019-09-14 LAB — D-DIMER, QUANTITATIVE: D-Dimer, Quant: 0.41 ug/mL-FEU (ref 0.00–0.50)

## 2019-09-14 LAB — C-REACTIVE PROTEIN: CRP: 4.7 mg/dL — ABNORMAL HIGH (ref <1.0)

## 2019-09-14 MED ORDER — FUROSEMIDE 20 MG PO TABS
20.0000 mg | ORAL_TABLET | Freq: Every day | ORAL | Status: DC
  Administered 2019-09-14 – 2019-09-17 (×4): 20 mg via ORAL
  Filled 2019-09-14 (×4): qty 1

## 2019-09-14 NOTE — Progress Notes (Signed)
Urine culture report

## 2019-09-14 NOTE — Progress Notes (Addendum)
PROGRESS NOTE  Mike Rowe  [id number] DOB: 11-Sep-1956 DOA: 09/12/2019 PCP: Bonnita Nasuti, MD   Brief Narrative: Mike Rowe is a 63 y.o. male with a history of ICH, cirrhosis, asthma, and diastolic heart failure among others who presented to [facility] after falling out of bed onto the floor. He is bed bound after ICH with left-sided contractures, also history of cocaine use. In the ED he was noted to be hypoxic to 89% on room air and febrile to 101.39F  with positive SARS-CoV-2 though no infiltrates on CXR. He had no deformities on exam. Pelvis XR negative. Urinalysis suggested UTI for which ceftriaxone and tylenol were given and admission to [facility] was requested. Due to hypoxia, remdesivir was started and ceftriaxone was continued.  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  - Continue remdesivir 10/29 - 11/2 - Holding steroids given the presence of bacterial infection. If hypoxia progresses, or wheezing is noted, however, would need to start steroids.  - No indication to give convalescent plasma at this time.  - Continue airborne, contact precautions. PPE including surgical gown, gloves, cap, shoe covers, and CAPR used during this encounter in a negative pressure room.  - Check daily labs: CBC w/diff, CMP, d-dimer, ferritin, CRP. Inflammatory markers roughly stable, if remain stable/upward trending, will add steroids 11/1. - Enoxaparin standard prophylactic dose. D-dimer noted to be mildly elevated c/w covid.   - Maintain euvolemia/net negative.  - Avoid NSAIDs - Recommend proning and aggressive use  of incentive spirometry. - Goals of care were discussed. Prognosis is guarded. Full code confirmed.   UTI:  - Monitor urine culture sent from [facility], no report yet. Will call today. - Continue ceftriaxone daily. No leukocytosis.  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 Chronically elevated LFTs noted, INR reassuringly 1.1 and albumin 4.0, suggesting compensation  - Monitor LFTs while on remdesivier, holding statin for now. LFTs improved. - Avoid hepatotoxins.    Prediabetes: HbA1c 6.3%, previously 5.2% in 2016. Fasting CBG wnl.  - Monitor on daily labs for hyperglycemia.   HFpEF:  - Restart lasix - Monitor I/O, daily weights  HTN:  - Norvasc 5mg , labetalol reordered   HLD:  - Holding fenofibrate due to diarrhea and statin for now with elevated LFTs  Anxiety disorder, major depressive disorder:  - Continue citalopram 20mg . Has valium on MAR here, not on facility [facility], so will hold for now.   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  - PT/OT. Passed SLP evaluation.  Advance diet.  Moderate persistent asthma, history of tobacco use: No evidence of exacerbation but has noticed some wheezing PTA.  - Scheduled combivent, formulary equivalent for symbicort - Prn albuterol  GERD:  - PPI  BPH:  - Monitor UOP closely, no significant post void residuals noted. Suspect this predisposes to UTIs.  - Continue flomax  Constipation: Resolved, now with diarrhea. Given imodium. - Hold bowel regimen.   DVT prophylaxis: Lovenox Code Status: Full Family Communication: None at bedside, declined offer to call. Disposition Plan: Return  to SNF once stabilized and completed remdesivir  Consultants:   None  Procedures:   None  Antimicrobials:  Remdesivir 10/29 - 11/2   Subjective: Eating ok, diarrhea has stopped with  imodium, no abd pain, nausea or vomiting, no fever. Shortness of breath is about the same. No chest pain.   Objective: Vitals:   09/13/19 2112 09/14/19 0447 09/14/19 0500 09/14/19 0700  BP: 138/87 (!) 142/82  132/79  Pulse: 79 78  62  Resp: 20 19  16   Temp: 97.9 F (36.6 C) 97.8 F (36.6 C)  98 F (36.7 C)  TempSrc: Oral Oral  Axillary  SpO2: 93% 93%  92%  Weight:   83 kg   Height:        Intake/Output Summary (Last 24 hours) at 09/14/2019 1047 Last data filed at 09/14/2019 0854 Gross per 24 hour  Intake 466.11 ml  Output 500 ml  Net -33.89 ml   Filed Weights   09/12/19 1700 09/13/19 0112 09/14/19 0500  Weight: 82 kg 83.8 kg 83 kg   Gen: 63 y.o. male in no distress Pulm: Nonlabored. Clearing crackles bilaterally. CV: Regular rate and rhythm. No murmur, rub, or gallop. No JVD, no dependent edema. GI: Abdomen soft, non-tender, non-distended, with normoactive bowel sounds. Very benign. Ext: Warm, no deformities Skin: No new rashes, lesions or ulcers on visualized skin.  Neuro: Alert and oriented. Spastic left hemiplegia unchanged.  Psych: Judgement and insight appear fair. Mood euthymic & affect congruent. Behavior is appropriate.    Data Reviewed: I have personally reviewed following labs and imaging studies  CBC: Recent Labs  Lab 09/13/19 0047 09/14/19 0230  WBC 4.0 4.0  NEUTROABS 2.6 2.4  HGB 13.5 13.9  HCT 41.4 43.1  MCV 93.5 92.5  PLT 109* [id number]*   Basic Metabolic Panel: Recent Labs  Lab 09/13/19 0047 09/14/19 0230  NA 140 140  K 3.3* 3.5  CL 108 107  CO2 22 20*  GLUCOSE 99 83  BUN 13 12  CREATININE 0.76 0.70  CALCIUM 8.9 9.1   GFR: Estimated Creatinine Clearance: 102.4 mL/min (by C-G formula based on SCr of 0.7 mg/dL). Liver Function Tests: Recent Labs  Lab 09/13/19 0047 09/14/19 0230  AST 82* 63*  ALT 60* 52*  ALKPHOS 39 42  BILITOT 0.8 0.8  PROT 7.2 7.5  ALBUMIN 3.8 3.9   No results for input(s): LIPASE, AMYLASE in the last 168 hours.  No results for input(s): AMMONIA in the last 168 hours. Coagulation Profile: No results for input(s): INR, PROTIME in the last 168 hours. Cardiac Enzymes: No results for input(s): CKTOTAL, CKMB, CKMBINDEX, TROPONINI in the last 168 hours. BNP (last 3 results) No results for input(s): PROBNP in the last 8760 hours. HbA1C: Recent Labs    09/13/19 0047  HGBA1C 6.3*   CBG: No results for input(s): GLUCAP in the last 168 hours. Lipid Profile: No results for input(s): CHOL, HDL, LDLCALC, TRIG, CHOLHDL, LDLDIRECT in the last 72 hours. Thyroid Function Tests: No results for input(s): TSH, T4TOTAL, FREET4, T3FREE, THYROIDAB in the last 72 hours. Anemia Panel: Recent Labs    09/13/19 0047 09/14/19 0230  FERRITIN 618* 539*   Urine analysis:    Component Value Date/Time   COLORURINE AMBER (A) 06/12/2015 1733   APPEARANCEUR CLOUDY (A) 06/12/2015 1733   LABSPEC 1.030 06/12/2015 1733   PHURINE 5.5 06/12/2015 1733   GLUCOSEU 100 (A) 06/12/2015 1733   HGBUR LARGE (A) 06/12/2015 1733   BILIRUBINUR NEGATIVE 06/12/2015 1733   KETONESUR  15 (A) 06/12/2015 1733   PROTEINUR 100 (A) 06/12/2015 1733   UROBILINOGEN 1.0 06/12/2015 1733   NITRITE NEGATIVE 06/12/2015 1733   LEUKOCYTESUR NEGATIVE 06/12/2015 1733   Recent Results (from the past 240 hour(s))  MRSA PCR Screening     Status: None   Collection Time: 09/12/19  8:20 PM   Specimen: Nasal Mucosa; Nasopharyngeal  Result Value Ref Range Status   MRSA by PCR NEGATIVE NEGATIVE Final    Comment:        The GeneXpert MRSA Assay (FDA approved for NASAL specimens only), is one component of a comprehensive MRSA colonization surveillance program. It is not intended to diagnose MRSA infection nor to guide or monitor treatment for MRSA infections. Performed at [facility], [place] [address], [place], [place] [address]       Radiology Studies: No results found.  Scheduled Meds: . amLODipine  5 mg Oral Daily  .  baclofen  20 mg Oral QID  . cholecalciferol  2,000 Units Oral Daily  . citalopram  20 mg Oral Daily  . diazepam  2 mg Oral BID  . enoxaparin (LOVENOX) injection  40 mg Subcutaneous Q24H  . fluticasone  1 spray Each Nare Daily  . Gerhardt's butt cream   Topical TID  . labetalol  300 mg Oral TID  . mouth rinse  15 mL Mouth Rinse BID  . mometasone-formoterol  2 puff Inhalation BID  . pantoprazole  40 mg Oral Daily  . sodium chloride flush  3 mL Intravenous Q12H  . sodium chloride flush  3 mL Intravenous Q12H  . tamsulosin  0.4 mg Oral Daily  . vitamin C  500 mg Oral Daily  . zinc sulfate  220 mg Oral Daily   Continuous Infusions: . sodium chloride Stopped (09/13/19 1100)  . cefTRIAXone (ROCEPHIN)  IV 2 g (09/14/19 0906)  . remdesivir 100 mg in NS 250 mL 500 mL/hr at 09/13/19 1800     LOS: 2 days   Time spent: 25 minutes.  [name], MD [facility] [email or web address] 09/14/2019, 10:47 AM

## 2019-09-15 LAB — CBC WITH DIFFERENTIAL/PLATELET
Abs Immature Granulocytes: 0.04 10*3/uL (ref 0.00–0.07)
Basophils Absolute: 0 10*3/uL (ref 0.0–0.1)
Basophils Relative: 0 %
Eosinophils Absolute: 0.1 10*3/uL (ref 0.0–0.5)
Eosinophils Relative: 1 %
HCT: 41.5 % (ref 39.0–52.0)
Hemoglobin: 13.4 g/dL (ref 13.0–17.0)
Immature Granulocytes: 1 %
Lymphocytes Relative: 22 %
Lymphs Abs: 1 10*3/uL (ref 0.7–4.0)
MCH: 30.2 pg (ref 26.0–34.0)
MCHC: 32.3 g/dL (ref 30.0–36.0)
MCV: 93.7 fL (ref 80.0–100.0)
Monocytes Absolute: 0.6 10*3/uL (ref 0.1–1.0)
Monocytes Relative: 13 %
Neutro Abs: 2.8 10*3/uL (ref 1.7–7.7)
Neutrophils Relative %: 63 %
Platelets: 162 10*3/uL (ref 150–400)
RBC: 4.43 MIL/uL (ref 4.22–5.81)
RDW: 12.8 % (ref 11.5–15.5)
WBC: 4.5 10*3/uL (ref 4.0–10.5)
nRBC: 0 % (ref 0.0–0.2)

## 2019-09-15 LAB — COMPREHENSIVE METABOLIC PANEL
ALT: 47 U/L — ABNORMAL HIGH (ref 0–44)
AST: 61 U/L — ABNORMAL HIGH (ref 15–41)
Albumin: 3.8 g/dL (ref 3.5–5.0)
Alkaline Phosphatase: 41 U/L (ref 38–126)
Anion gap: 11 (ref 5–15)
BUN: 14 mg/dL (ref 8–23)
CO2: 21 mmol/L — ABNORMAL LOW (ref 22–32)
Calcium: 8.8 mg/dL — ABNORMAL LOW (ref 8.9–10.3)
Chloride: 109 mmol/L (ref 98–111)
Creatinine, Ser: 0.66 mg/dL (ref 0.61–1.24)
GFR calc Af Amer: >60 mL/min (ref >60)
GFR calc non Af Amer: >60 mL/min (ref >60)
Glucose, Bld: 106 mg/dL — ABNORMAL HIGH (ref 70–99)
Potassium: 2.9 mmol/L — ABNORMAL LOW (ref 3.5–5.1)
Sodium: 141 mmol/L (ref 135–145)
Total Bilirubin: 0.5 mg/dL (ref 0.3–1.2)
Total Protein: 7.1 g/dL (ref 6.5–8.1)

## 2019-09-15 LAB — D-DIMER, QUANTITATIVE: D-Dimer, Quant: 0.43 ug/mL-FEU (ref 0.00–0.50)

## 2019-09-15 LAB — FERRITIN: Ferritin: 516 ng/mL — ABNORMAL HIGH (ref 24–336)

## 2019-09-15 LAB — C-REACTIVE PROTEIN: CRP: 2.8 mg/dL — ABNORMAL HIGH (ref <1.0)

## 2019-09-15 MED ORDER — POTASSIUM CHLORIDE CRYS ER 20 MEQ PO TBCR
40.0000 meq | EXTENDED_RELEASE_TABLET | Freq: Two times a day (BID) | ORAL | Status: AC
  Administered 2019-09-15 (×2): 40 meq via ORAL
  Filled 2019-09-15 (×2): qty 2

## 2019-09-15 MED ORDER — MAGNESIUM SULFATE 2 GM/50ML IV SOLN
2.0000 g | Freq: Once | INTRAVENOUS | Status: AC
  Administered 2019-09-15: 2 g via INTRAVENOUS
  Filled 2019-09-15: qty 50

## 2019-09-15 NOTE — Progress Notes (Signed)
Tried to contact legal guardian Carney Harder at [phone] just got voice mail.

## 2019-09-15 NOTE — Progress Notes (Signed)
Ok to add stop date of ceftriaxone for 5d for UTI/PNA per Dr. Bonner Puna.  Onnie Boer, PharmD, BCIDP, AAHIVP, CPP Infectious Disease Pharmacist 09/15/2019 1:15 PM

## 2019-09-15 NOTE — Progress Notes (Signed)
PROGRESS NOTE  Mike Rowe  [id number] DOB: 28-Jun-1956 DOA: 09/12/2019 PCP: Bonnita Nasuti, MD   Brief Narrative: Mike Rowe is a 63 y.o. male with a history of ICH, cirrhosis, asthma, and diastolic heart failure among others who presented to [facility] after falling out of bed onto the floor. He is bed bound after ICH with left-sided contractures, also history of cocaine use. In the ED he was noted to be hypoxic to 89% on room air and febrile to 101.9F  with positive SARS-CoV-2 though no infiltrates on CXR. He had no deformities on exam. Pelvis XR negative. Urinalysis suggested UTI for which ceftriaxone and tylenol were given and admission to [facility] was requested. Due to hypoxia, remdesivir was started and ceftriaxone was continued.  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  - Continue remdesivir 10/29 - 11/2 - Holding steroids given the presence of bacterial infection. Hypoxia improving.  - No indication to give convalescent plasma at this time.  - Continue airborne, contact precautions. PPE including surgical gown, gloves, cap, shoe covers, and CAPR used during this encounter in a negative pressure room.  - Check daily labs: CBC w/diff, CMP, d-dimer, ferritin, CRP. Inflammatory markers rimproving. - Enoxaparin standard prophylactic dose. D-dimer noted to be mildly elevated c/w covid.   - Maintain euvolemia/net negative.  - Avoid NSAIDs - Recommend proning and aggressive use of incentive spirometry. - Goals of care were discussed. Prognosis is guarded. Full code confirmed.   Klebsiella UTI:  Resistant to ampicillin and macrobid. See Cx report (from [facility]) in pharmacy note 10/31.   - Continue ceftriaxone daily. No leukocytosis.  Hypokalemia: Supplemented and now worse this AM.  - Give 12mEq BID and recheck in AM. Anticipate ongoing need for supplementation with lasix. - Empiric Mg now and recheck in AM.   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 Chronically elevated LFTs noted, INR reassuringly 1.1 and albumin 4.0, suggesting compensation  - Monitor LFTs while on remdesivier, holding statin for now. LFTs improving slowly. - Avoid hepatotoxins.    Prediabetes: HbA1c 6.3%, previously 5.2% in 2016. Fasting CBG wnl.  - Monitor on daily labs for hyperglycemia.   HFpEF:  - Restarted lasix 10/31 - Monitor I/O, daily weights  HTN:  - Norvasc 5mg , labetalol reordered   HLD:  - Holding fenofibrate due to diarrhea and statin for now with elevated LFTs  Anxiety disorder, major depressive disorder:  - Continue citalopram 20mg . Has valium on MAR here, not on facility [facility], so will hold for now.   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  - PT/OT. Passed SLP evaluation.  Advance diet.  Moderate persistent asthma, history of tobacco use: No evidence of exacerbation but has noticed some wheezing PTA.  - Scheduled combivent, formulary equivalent for symbicort - Prn albuterol  GERD:  - PPI  BPH:  - Monitor UOP closely, no significant post void residuals noted. Suspect this predisposes to UTIs.  - Continue flomax  Constipation: Resolved, now with diarrhea. Given imodium. - Hold bowel regimen.   DVT prophylaxis: Lovenox Code Status:  Full Family Communication: None at bedside, declined offer to call. Disposition Plan: Return to SNF once stabilized and completed remdesivir  Consultants:   None  Procedures:   None  Antimicrobials:  Remdesivir  10/29 - 11/2   Ceftriaxone 10/30 -   Subjective: No new complaints, feels well this morning. Eating well, no shortness of breath this AM.    Objective: Vitals:   09/14/19 1900 09/15/19 0500 09/15/19 0600 09/15/19 0739  BP: 140/83  (!) 144/88 (!) 141/89  Pulse: 79  61 66  Resp:    16  Temp: (!) 97.3 F (36.3 C)  98 F (36.7 C) 97.7 F (36.5 C)  TempSrc: Oral  Oral Oral  SpO2: 93%  98% 93%  Weight:  83.5 kg    Height:        Intake/Output Summary (Last 24 hours) at 09/15/2019 1058 Last data filed at 09/15/2019 0700 Gross per 24 hour  Intake -  Output 400 ml  Net -400 ml   Filed Weights   09/14/19 0500 09/14/19 1300 09/15/19 0500  Weight: 83 kg 83.5 kg 83.5 kg   Gen: 63 y.o. male in no distress Pulm: Nonlabored breathing room air. Crackles cleared. CV: Regular rate and rhythm. No murmur, rub, or gallop. No JVD, no dependent edema. GI: Abdomen soft, non-tender, non-distended, with normoactive bowel sounds.  Ext: Warm, stable contractures with decreased muscle bulk. Skin: No new rashes, lesions or ulcers on visualized skin. Neuro: Alert and oriented. No new focal neurological deficits. Psych: Judgement and insight appear fair. Mood euthymic & affect congruent. Behavior is appropriate.    Data Reviewed: I have personally reviewed following labs and imaging studies  CBC: Recent Labs  Lab 09/13/19 0047 09/14/19 0230 09/15/19 0149  WBC 4.0 4.0 4.5  NEUTROABS 2.6 2.4 2.8  HGB 13.5 13.9 13.4  HCT 41.4 43.1 41.5  MCV 93.5 92.5 93.7  PLT 109* 144* 0000000   Basic Metabolic Panel: Recent Labs  Lab 09/13/19 0047 09/14/19 0230 09/15/19 0149  NA 140 140 141  K 3.3* 3.5 2.9*  CL 108 107 109  CO2 22 20* 21*  GLUCOSE 99 83 106*  BUN 13 12 14   CREATININE 0.76 0.70 0.66  CALCIUM 8.9 9.1 8.8*   GFR: Estimated Creatinine Clearance: 102.4 mL/min (by C-G formula based on SCr of 0.66 mg/dL). Liver Function Tests: Recent Labs  Lab 09/13/19 0047 09/14/19 0230 09/15/19  0149  AST 82* 63* 61*  ALT 60* 52* 47*  ALKPHOS 39 42 41  BILITOT 0.8 0.8 0.5  PROT 7.2 7.5 7.1  ALBUMIN 3.8 3.9 3.8   Anemia Panel: Recent Labs    09/14/19 0230 09/15/19 0149  FERRITIN 539* 516*   Urine analysis:    Component Value Date/Time   COLORURINE AMBER (A) 06/12/2015 1733   APPEARANCEUR CLOUDY (A) 06/12/2015 1733   LABSPEC 1.030 06/12/2015 1733   PHURINE 5.5 06/12/2015 1733   GLUCOSEU 100 (A) 06/12/2015 1733   HGBUR LARGE (A) 06/12/2015 1733   BILIRUBINUR NEGATIVE 06/12/2015 1733   KETONESUR 15 (A) 06/12/2015 1733   PROTEINUR 100 (A) 06/12/2015 1733   UROBILINOGEN 1.0 06/12/2015 1733   NITRITE NEGATIVE 06/12/2015 1733   LEUKOCYTESUR NEGATIVE 06/12/2015 1733   Recent Results (from the past 240 hour(s))  MRSA PCR Screening     Status: None   Collection Time: 09/12/19  8:20 PM   Specimen: Nasal Mucosa; Nasopharyngeal  Result Value Ref Range Status   MRSA by PCR NEGATIVE NEGATIVE Final    Comment:  The GeneXpert MRSA Assay (FDA approved for NASAL specimens only), is one component of a comprehensive MRSA colonization surveillance program. It is not intended to diagnose MRSA infection nor to guide or monitor treatment for MRSA infections. Performed at [facility], [place] [address] [place], [place] [address]       Radiology Studies: No results found.  Scheduled Meds: . amLODipine  5 mg Oral Daily  . baclofen  20 mg Oral QID  . cholecalciferol  2,000 Units Oral Daily  . citalopram  20 mg Oral Daily  . diazepam  2 mg Oral BID  . enoxaparin (LOVENOX) injection  40 mg Subcutaneous Q24H  . fluticasone  1 spray Each Nare Daily  . furosemide  20 mg Oral Daily  . Gerhardt's butt cream   Topical TID  . labetalol  300 mg Oral TID  . mouth rinse  15 mL Mouth Rinse BID  . mometasone-formoterol  2 puff Inhalation BID  . pantoprazole  40 mg Oral Daily  . potassium chloride  40 mEq Oral BID  . sodium chloride flush  3 mL Intravenous  Q12H  . sodium chloride flush  3 mL Intravenous Q12H  . tamsulosin  0.4 mg Oral Daily  . vitamin C  500 mg Oral Daily  . zinc sulfate  220 mg Oral Daily   Continuous Infusions: . sodium chloride Stopped (09/13/19 1100)  . cefTRIAXone (ROCEPHIN)  IV Stopped (09/14/19 1000)  . remdesivir 100 mg in NS 250 mL 100 mg (09/14/19 1708)     LOS: 3 days   Time spent: 25 minutes.  [name], MD [facility] [email or web address] 09/15/2019, 10:58 AM

## 2019-09-16 LAB — CBC WITH DIFFERENTIAL/PLATELET
Abs Immature Granulocytes: 0.05 10*3/uL (ref 0.00–0.07)
Basophils Absolute: 0 10*3/uL (ref 0.0–0.1)
Basophils Relative: 0 %
Eosinophils Absolute: 0.1 10*3/uL (ref 0.0–0.5)
Eosinophils Relative: 2 %
HCT: 43.3 % (ref 39.0–52.0)
Hemoglobin: 14 g/dL (ref 13.0–17.0)
Immature Granulocytes: 1 %
Lymphocytes Relative: 16 %
Lymphs Abs: 0.9 10*3/uL (ref 0.7–4.0)
MCH: 30 pg (ref 26.0–34.0)
MCHC: 32.3 g/dL (ref 30.0–36.0)
MCV: 92.9 fL (ref 80.0–100.0)
Monocytes Absolute: 0.6 10*3/uL (ref 0.1–1.0)
Monocytes Relative: 11 %
Neutro Abs: 3.7 10*3/uL (ref 1.7–7.7)
Neutrophils Relative %: 70 %
Platelets: 168 10*3/uL (ref 150–400)
RBC: 4.66 MIL/uL (ref 4.22–5.81)
RDW: 13 % (ref 11.5–15.5)
WBC: 5.3 10*3/uL (ref 4.0–10.5)
nRBC: 0 % (ref 0.0–0.2)

## 2019-09-16 LAB — COMPREHENSIVE METABOLIC PANEL
ALT: 47 U/L — ABNORMAL HIGH (ref 0–44)
AST: 62 U/L — ABNORMAL HIGH (ref 15–41)
Albumin: 4 g/dL (ref 3.5–5.0)
Alkaline Phosphatase: 47 U/L (ref 38–126)
Anion gap: 12 (ref 5–15)
BUN: 11 mg/dL (ref 8–23)
CO2: 20 mmol/L — ABNORMAL LOW (ref 22–32)
Calcium: 8.9 mg/dL (ref 8.9–10.3)
Chloride: 109 mmol/L (ref 98–111)
Creatinine, Ser: 0.69 mg/dL (ref 0.61–1.24)
GFR calc Af Amer: >60 mL/min (ref >60)
GFR calc non Af Amer: >60 mL/min (ref >60)
Glucose, Bld: 101 mg/dL — ABNORMAL HIGH (ref 70–99)
Potassium: 3.5 mmol/L (ref 3.5–5.1)
Sodium: 141 mmol/L (ref 135–145)
Total Bilirubin: 0.8 mg/dL (ref 0.3–1.2)
Total Protein: 7.3 g/dL (ref 6.5–8.1)

## 2019-09-16 LAB — D-DIMER, QUANTITATIVE: D-Dimer, Quant: 0.4 ug/mL-FEU (ref 0.00–0.50)

## 2019-09-16 LAB — C-REACTIVE PROTEIN: CRP: 2.3 mg/dL — ABNORMAL HIGH (ref <1.0)

## 2019-09-16 LAB — MAGNESIUM: Magnesium: 2.2 mg/dL (ref 1.7–2.4)

## 2019-09-16 MED ORDER — POTASSIUM CHLORIDE CRYS ER 20 MEQ PO TBCR
40.0000 meq | EXTENDED_RELEASE_TABLET | Freq: Every day | ORAL | Status: AC
  Administered 2019-09-16 – 2019-09-17 (×2): 40 meq via ORAL
  Filled 2019-09-16 (×2): qty 2

## 2019-09-16 NOTE — Plan of Care (Signed)
Discussed with patient plan of care for the evening, pain management and we would use his incentive spirometry with some assistance.  Was unable to update his legal guardian at this time answering machine only.

## 2019-09-16 NOTE — Progress Notes (Signed)
Family Update  Called patients Legal Guardian and left message to call for update.

## 2019-09-16 NOTE — Progress Notes (Signed)
PROGRESS NOTE  Pual Cheadle  [id number] DOB: 1956-03-24 DOA: 09/12/2019 PCP: Bonnita Nasuti, MD   Brief Narrative: Perl Klauser is a 63 y.o. male with a history of ICH, cirrhosis, asthma, and diastolic heart failure among others who presented to [facility] after falling out of bed onto the floor. He is bed bound after ICH with left-sided contractures, also history of cocaine use. In the ED he was noted to be hypoxic to 89% on room air and febrile to 101.66F  with positive SARS-CoV-2 though no infiltrates on CXR. He had no deformities on exam. Pelvis XR negative. Urinalysis suggested UTI for which ceftriaxone and tylenol were given and admission to [facility] was requested. Due to hypoxia, remdesivir was started and ceftriaxone was continued.  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  - Continue remdesivir 10/29 - 11/2 - Holding steroids given the presence of bacterial infection. Hypoxia improved  - No indication to give convalescent plasma at this time.  - Continue airborne, contact precautions. PPE including surgical gown, gloves, cap, shoe covers, and CAPR used during this encounter in a negative pressure room.  - Check daily labs: CBC w/diff, CMP, d-dimer, ferritin, CRP. Inflammatory markers rimproving. - Enoxaparin standard prophylactic dose. D-dimer noted to be mildly elevated c/w covid.   - Maintain euvolemia/net negative.  - Avoid NSAIDs - Recommend proning and aggressive use of incentive spirometry. - Goals of care were discussed. Prognosis is guarded. Full code confirmed.   Klebsiella UTI:  Resistant to ampicillin and macrobid. See Cx report (from [facility]) in pharmacy note 10/31.   - Continue ceftriaxone daily x5 days, final dose tmrw. No leukocytosis.  Hypokalemia: Supplemented and improved. Will continue. - Anticipate ongoing need for supplementation with lasix. - Mg given and replete.  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 Chronically elevated LFTs noted, INR reassuringly 1.1 and albumin 4.0, suggesting compensation  - Monitor LFTs while on remdesivier, holding statin for now. LFTs stable, last check tmrw. - Avoid hepatotoxins.    Prediabetes: HbA1c 6.3%, previously 5.2% in 2016. Fasting CBG wnl.  - Monitor on daily labs for hyperglycemia.   HFpEF:  - Restarted lasix 10/31 - Monitor I/O, daily weights  HTN:  - Norvasc 5mg , labetalol reordered   HLD:  - Holding fenofibrate due to diarrhea and statin for now with elevated LFTs  Anxiety disorder, major depressive disorder:  - Continue citalopram 20mg .   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  - PT/OT. Passed SLP evaluation.  Advance diet.  Moderate persistent asthma, history of tobacco use: No evidence of exacerbation but has noticed some wheezing PTA.  - Scheduled combivent, formulary equivalent for symbicort - Prn albuterol  GERD:  - PPI  BPH:  - Monitor UOP closely, no significant post void residuals noted. Suspect this predisposes to UTIs.  - Continue flomax  Constipation: Resolved, now with diarrhea. Given imodium. - Hold bowel regimen.   DVT prophylaxis: Lovenox Code Status: Full Family Communication: None at bedside, declined offer to call. Disposition Plan: Return to SNF once stabilized and completed remdesivir,  likely 11/3, CSW aware.  Consultants:   None  Procedures:   None  Antimicrobials:  Remdesivir 10/29 - 11/2   Ceftriaxone 10/30 - 11/3   Subjective: Feels better, no dyspnea or new complaints.    Objective: Vitals:   09/15/19 2000 09/16/19 0454 09/16/19 0459 09/16/19 0745  BP: 137/75 132/74  136/89  Pulse: 65 79  65  Resp: 18 18  16   Temp: 98.7 F (37.1 C) 98.2 F (36.8 C)  98.3 F (36.8 C)  TempSrc: Oral Oral  Oral  SpO2: 91% 95%  93%  Weight:   83.7 kg   Height:        Intake/Output Summary (Last 24 hours) at 09/16/2019 1157 Last data filed at 09/16/2019 0936 Gross per 24 hour  Intake 1820 ml  Output 726 ml  Net 1094 ml   Filed Weights   09/15/19 0500 09/15/19 1535 09/16/19 0459  Weight: 83.5 kg 84 kg 83.7 kg   Gen: 63 y.o. male in no distress Pulm: Nonlabored breathing room air. Clear. CV: Regular rate and rhythm. No murmur, rub, or gallop. No JVD, no dependent edema. GI: Abdomen soft, non-tender, non-distended, with normoactive bowel sounds.  Ext: Warm, no new deformities Skin: No new rashes, lesions or ulcers on visualized skin. Neuro: Alert and oriented. No new focal neurological deficits. Psych: Judgement and insight appear fair. Mood euthymic & affect congruent. Behavior is appropriate.    Data Reviewed: I have personally reviewed following labs and imaging studies  CBC: Recent Labs  Lab 09/13/19 0047 09/14/19 0230 09/15/19 0149 09/16/19 0445  WBC 4.0 4.0 4.5 5.3  NEUTROABS 2.6 2.4 2.8 3.7  HGB 13.5 13.9 13.4 14.0  HCT 41.4 43.1 41.5 43.3  MCV 93.5 92.5 93.7 92.9  PLT 109* 144* 162 [id number]   Basic Metabolic Panel: Recent Labs  Lab 09/13/19 0047 09/14/19 0230 09/15/19 0149 09/16/19 0445  NA 140 140 141 141  K 3.3* 3.5 2.9* 3.5  CL 108 107 109 109  CO2 22 20* 21* 20*  GLUCOSE 99 83 106* 101*  BUN 13 12 14 11   CREATININE 0.76 0.70 0.66 0.69  CALCIUM 8.9 9.1 8.8* 8.9  MG  --   --   --  2.2   GFR: Estimated Creatinine Clearance: 102.4 mL/min (by C-G formula based on SCr of 0.69 mg/dL). Liver Function Tests: Recent Labs  Lab 09/13/19 0047 09/14/19 0230 09/15/19 0149 09/16/19  0445  AST 82* 63* 61* 62*  ALT 60* 52* 47* 47*  ALKPHOS 39 42 41 47  BILITOT 0.8 0.8 0.5 0.8  PROT 7.2 7.5 7.1 7.3  ALBUMIN 3.8 3.9 3.8 4.0   Anemia Panel: Recent Labs    09/14/19 0230 09/15/19 0149  FERRITIN 539* 516*   Urine analysis:    Component Value Date/Time   COLORURINE AMBER (A) 06/12/2015 1733   APPEARANCEUR CLOUDY (A) 06/12/2015 1733   LABSPEC 1.030 06/12/2015 1733   PHURINE 5.5 06/12/2015 1733   GLUCOSEU 100 (A) 06/12/2015 1733   HGBUR LARGE (A) 06/12/2015 1733   BILIRUBINUR NEGATIVE 06/12/2015 1733   KETONESUR 15 (A) 06/12/2015 1733   PROTEINUR 100 (A) 06/12/2015 1733   UROBILINOGEN 1.0 06/12/2015 1733   NITRITE NEGATIVE 06/12/2015 1733   LEUKOCYTESUR NEGATIVE 06/12/2015 1733   Recent Results (from the past 240 hour(s))  MRSA PCR Screening     Status: None   Collection Time: 09/12/19  8:20 PM   Specimen: Nasal Mucosa; Nasopharyngeal  Result Value Ref Range Status   MRSA by PCR  NEGATIVE NEGATIVE Final    Comment:        The GeneXpert MRSA Assay (FDA approved for NASAL specimens only), is one component of a comprehensive MRSA colonization surveillance program. It is not intended to diagnose MRSA infection nor to guide or monitor treatment for MRSA infections. Performed at [facility], [place] [address], [place], [place] [address]       Radiology Studies: No results found.  Scheduled Meds: . amLODipine  5 mg Oral Daily  . baclofen  20 mg Oral QID  . cholecalciferol  2,000 Units Oral Daily  . citalopram  20 mg Oral Daily  . diazepam  2 mg Oral BID  . enoxaparin (LOVENOX) injection  40 mg Subcutaneous Q24H  . fluticasone  1 spray Each Nare Daily  . furosemide  20 mg Oral Daily  . Gerhardt's butt cream   Topical TID  . labetalol  300 mg Oral TID  . mouth rinse  15 mL Mouth Rinse BID  . mometasone-formoterol  2 puff Inhalation BID  . pantoprazole  40 mg Oral Daily  . sodium chloride flush  3 mL Intravenous Q12H  . sodium  chloride flush  3 mL Intravenous Q12H  . tamsulosin  0.4 mg Oral Daily  . vitamin C  500 mg Oral Daily  . zinc sulfate  220 mg Oral Daily   Continuous Infusions: . sodium chloride Stopped (09/13/19 1100)  . cefTRIAXone (ROCEPHIN)  IV 2 g (09/16/19 0936)  . remdesivir 100 mg in NS 250 mL Stopped (09/15/19 1850)     LOS: 4 days   Time spent: 25 minutes.  [name], MD [facility] [email or web address] 09/16/2019, 11:57 AM

## 2019-09-16 NOTE — Progress Notes (Signed)
Occupational Therapy Treatment Patient Details Name: Mike Rowe MRN: [id number] DOB: [birth year] Today's Date: 09/16/2019    History of present illness 63 y.o. male with a history of ICH, cirrhosis, asthma, and diastolic heart failure among others who presented to [facility] after falling out of bed onto the floor. He is bed bound after ICH with left-sided contractures, also history of cocaine use. In the ED he was noted to be hypoxic to 89% on room air and febrile to 101.24F  with positive SARS-CoV-2 though no infiltrates on CXR.   OT comments  Pt seen for positioning as noted below. Interdry place in L elbow area and woven between digits L hand to improve skin integrity. Nsg educated on use of Interdry, which can be used for 5 days. Do not use cream/lotion or powder in addition to Interdry. Interdry placed on pt;s counter Pt states L palm guard feels better. Asked nsg to order B prevalon boots to help protect heels/ankles. Pt asking for Baclofen and Percocet - nsg aware. Will follow up regarding positioning needs.   Follow Up Recommendations  SNF;Supervision/Assistance - 24 hour    Equipment Recommendations  None recommended by OT    Recommendations for Other Services      Precautions / Restrictions Precautions Precautions: Fall Precaution Comments: significant contractures L LE       Mobility Bed Mobility                  Transfers                      Balance                                           ADL either performed or assessed with clinical judgement   ADL                                               Vision       Perception     Praxis      Cognition Arousal/Alertness: Awake/alert Behavior During Therapy: Flat affect Overall Cognitive Status: History of cognitive impairments - at baseline                                          Exercises Other Exercises Other Exercises: Pt repositioned  in bed. Pt assists with uplling up on bed rail.    Shoulder Instructions       General Comments Pt seen for positioning check. Interdry placed in L anticubital fossa after cleaning and drying area. Pt tolerating L plam guard and states it "feels good". Interdry woven between digits to after cleaning areas to improve skin integrety. Educated nursing on use of Interdry. Discussed [place] eof prevalon boots. Nsg to order.     Pertinent Vitals/ Pain       Pain Assessment: Faces Faces Pain Scale: Hurts little more Pain Location: wiht LUE ROM Pain Descriptors / Indicators: Grimacing;Discomfort Pain Intervention(s): Limited activity within patient's tolerance;Patient requesting pain meds-RN notified  Home Living  Prior Functioning/Environment              Frequency  Min 2X/week        Progress Toward Goals  OT Goals(current goals can now be found in the care plan section)  Progress towards OT goals: Progressing toward goals  Acute Rehab OT Goals Patient Stated Goal: to get better OT Goal Formulation: With patient Time For Goal Achievement: 09/27/19 Potential to Achieve Goals: Good ADL Goals Additional ADL Goal #1: Pt will demonstrate decreased redness and moisutre associated skin damage L elbow and hand with use of Interdry and hygiene practices Additional ADL Goal #2: Pt will tolerate L palm guard to improve hand hygiene  Plan Discharge plan remains appropriate    Co-evaluation                 AM-PAC OT "6 Clicks" Daily Activity     Outcome Measure   Help from another person eating meals?: A Little Help from another person taking care of personal grooming?: A Little Help from another person toileting, which includes using toliet, bedpan, or urinal?: Total Help from another person bathing (including washing, rinsing, drying)?: A Lot Help from another person to put on and taking off regular upper body  clothing?: A Lot Help from another person to put on and taking off regular lower body clothing?: Total 6 Click Score: 12    End of Session    OT Visit Diagnosis: Muscle weakness (generalized) (M62.81);Pain Pain - Right/Left: Left Pain - part of body: Hand;Arm   Activity Tolerance Patient tolerated treatment well   Patient Left     Nurse Communication          Time: [id number] OT Time Calculation (min): 20 min  Charges: OT General Charges $OT Visit: 1 Visit OT Treatments $Therapeutic Activity: 8-22 mins  Maurie Boettcher, OT/L   Acute OT Clinical Specialist [place] Pager [phone] Office [phone]    [facility] 09/16/2019, 2:01 PM

## 2019-09-17 LAB — COMPREHENSIVE METABOLIC PANEL
ALT: 42 U/L (ref 0–44)
AST: 51 U/L — ABNORMAL HIGH (ref 15–41)
Albumin: 3.8 g/dL (ref 3.5–5.0)
Alkaline Phosphatase: 51 U/L (ref 38–126)
Anion gap: 10 (ref 5–15)
BUN: 9 mg/dL (ref 8–23)
CO2: 21 mmol/L — ABNORMAL LOW (ref 22–32)
Calcium: 8.9 mg/dL (ref 8.9–10.3)
Chloride: 108 mmol/L (ref 98–111)
Creatinine, Ser: 0.65 mg/dL (ref 0.61–1.24)
GFR calc Af Amer: >60 mL/min (ref >60)
GFR calc non Af Amer: >60 mL/min (ref >60)
Glucose, Bld: 105 mg/dL — ABNORMAL HIGH (ref 70–99)
Potassium: 3.4 mmol/L — ABNORMAL LOW (ref 3.5–5.1)
Sodium: 139 mmol/L (ref 135–145)
Total Bilirubin: 0.8 mg/dL (ref 0.3–1.2)
Total Protein: 7 g/dL (ref 6.5–8.1)

## 2019-09-17 LAB — CBC WITH DIFFERENTIAL/PLATELET
Abs Immature Granulocytes: 0.06 10*3/uL (ref 0.00–0.07)
Basophils Absolute: 0 10*3/uL (ref 0.0–0.1)
Basophils Relative: 0 %
Eosinophils Absolute: 0.1 10*3/uL (ref 0.0–0.5)
Eosinophils Relative: 1 %
HCT: 41.9 % (ref 39.0–52.0)
Hemoglobin: 13.6 g/dL (ref 13.0–17.0)
Immature Granulocytes: 1 %
Lymphocytes Relative: 16 %
Lymphs Abs: 0.9 10*3/uL (ref 0.7–4.0)
MCH: 30.2 pg (ref 26.0–34.0)
MCHC: 32.5 g/dL (ref 30.0–36.0)
MCV: 93.1 fL (ref 80.0–100.0)
Monocytes Absolute: 0.6 10*3/uL (ref 0.1–1.0)
Monocytes Relative: 11 %
Neutro Abs: 4.1 10*3/uL (ref 1.7–7.7)
Neutrophils Relative %: 71 %
Platelets: 155 10*3/uL (ref 150–400)
RBC: 4.5 MIL/uL (ref 4.22–5.81)
RDW: 12.9 % (ref 11.5–15.5)
WBC: 5.8 10*3/uL (ref 4.0–10.5)
nRBC: 0 % (ref 0.0–0.2)

## 2019-09-17 LAB — D-DIMER, QUANTITATIVE: D-Dimer, Quant: 0.42 ug/mL-FEU (ref 0.00–0.50)

## 2019-09-17 LAB — C-REACTIVE PROTEIN: CRP: 4 mg/dL — ABNORMAL HIGH (ref <1.0)

## 2019-09-17 MED ORDER — DIAZEPAM 2 MG PO TABS: 2.0000 mg | ORAL_TABLET | Freq: Two times a day (BID) | ORAL | 0 refills | Status: AC

## 2019-09-17 MED ORDER — OXYCODONE-ACETAMINOPHEN 7.5-325 MG PO TABS: 1.0000 | ORAL_TABLET | Freq: Three times a day (TID) | ORAL | 0 refills | Status: DC | PRN

## 2019-09-17 NOTE — TOC Transition Note (Signed)
Transition of Care [facility]) - CM/SW Discharge Note   Patient Details  Name: Mike Rowe MRN: [id number] Date of Birth: 08/11/1956  Transition of Care [facility]) CM/SW Contact:  Weston Anna, LCSW Phone Number: 09/17/2019, 12:04 PM   Clinical Narrative:     Patient set to discharge back to [facility] today- please call report to [phone]. CSW notified guardian, Carney Harder, no concerns. PTAR called for transportation.   Final next level of care: Skilled Nursing Facility Barriers to Discharge: No Barriers Identified   Patient Goals and CMS Choice        Discharge Placement              Patient chooses bed at: Other - please specify in the comment section below:([facility]) Patient to be transferred to facility by: [place] Name of family member notified: Guardian- Carney Harder Patient and family notified of of transfer: 09/17/19  Discharge Plan and Services In-house Referral: Clinical Social Work              DME Arranged: N/A         HH Arranged: NA          Social Determinants of Health (SDOH) Interventions     Readmission Risk Interventions Readmission Risk Prevention Plan 09/13/2019  Medication Screening Complete  Transportation Screening Complete  Some recent data might be hidden

## 2019-09-17 NOTE — Progress Notes (Signed)
Discharge Note:  Primary RN reviewed discharge summary with patients guardian Carney Harder at [place] after educating patient. Primary RN also contacted [facility] to give report to [facility] LPN whom will be receiving patient once he is discharged back to SNF. Vitals obtained and personal belongings where accounted for/placed in bag after patient received a bath with change of clothing. PIV and telemetry monitor removed. Patient awaiting transport at this time.

## 2019-09-17 NOTE — Discharge Summary (Signed)
Physician Discharge Summary  Mike Rowe [id number] DOB: 10/22/1956 DOA: 09/12/2019  PCP: Bonnita Nasuti, MD  Admit date: 09/12/2019 Discharge date: 09/17/2019  Admitted From: SNF Disposition: SNF   Recommendations for Outpatient Follow-up:  1. Follow up with PCP in 1-2 weeks 2. Please obtain CMP/CBC in one week  Home Health: N/A Equipment/Devices: Per SNF Discharge Condition: Stable CODE STATUS: Full Diet recommendation: Regular  Brief/Interim Summary: Mike Rowe a 63 y.o.malewith a history ofICH, cirrhosis, asthma, and diastolic heart failure among others who presented to [facility] after falling out of bed onto the floor.He is bed bound after ICH with left-sided contractures, also history of cocaine use. In the ED he was noted to be hypoxic to 89% on room air and febrile to 101.1Fwith positive SARS-CoV-2 thoughno infiltrates on CXR. He had no deformities on exam.Pelvis XR negative.Urinalysis suggested UTI for which ceftriaxone and tylenol were given and admission to [facility] was requested.Due to hypoxia, remdesivir was started and ceftriaxone was continued for 5 days per urine culture data. See below for further details.  Discharge Diagnoses:  Principal Problem:   Acute respiratory disease due to COVID-19 virus Active Problems:   Acute respiratory failure with hypoxia (HCC)   Cirrhosis of liver without ascites (HCC)  Acute hypoxic respiratory failure due to covid-19, atelectasis likely also contributing:Despite no infiltrate on CXR at admission, I do hear crackles on exam and strongly suspect pneumonia based this and hypoxia with elevated inflammatory markers.  - Completed remdesivir 10/29 - 11/2 - Holding steroids given the presence of bacterial infection and in absence of hypoxia. - No indication to give convalescent plasma at this time. - Recommend use of incentive spirometry.  Klebsiella UTI: Resistant to ampicillin and macrobid only. See Cx report (from [facility]) in  pharmacy note 10/31.   - Continue ceftriaxone daily x5 days, final dose on day of discharge. No leukocytosis.  Hypokalemia: Supplemented and improved. Will continue. - Anticipate improvement with reinitiation of ACE inhibitor, consider rechecking and supplementing K as needed given ongoing loop diuretic use.   - Mg given and replete.  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 - Avoid hepatotoxins.    Prediabetes: HbA1c 6.3%, previously 5.2% in 2016. Fasting CBG wnl.   HFpEF: Appears euvolemic - Restarted lasix 10/31 - Monitor I/O, daily weights  HTN:  - Continue home regimen  HLD:  - Ok to restart home regimen.  Anxiety disorder, major depressive disorder:  - Continue home medications. PDMP reviewed.  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 Passed SLP evaluation.   Moderate persistent asthma, history of tobacco use: No evidence of exacerbation. - continue home regimen  GERD: - PPI  BPH:  - Monitor UOP closely, no significant post void residuals noted. Suspect this predisposes to UTIs. - Continue flomax  Constipation: Resolved, now with diarrhea. Given imodium. Continue monitoring  Discharge Instructions  Allergies as of 09/17/2019   No Known Allergies     Medication List    TAKE these medications   acetaminophen 325 MG tablet Commonly known as: TYLENOL Take 650 mg by mouth every 6 (six) hours as needed for mild pain or moderate pain.   amLODipine 5 MG tablet Commonly known as: NORVASC Take 5 mg by mouth daily. What changed: Another medication with the same name was removed. Continue  taking this medication, and follow the directions you see here.   atorvastatin 40 MG tablet Commonly known  as: LIPITOR Take 40 mg by mouth at bedtime.   baclofen 20 MG tablet Commonly known as: LIORESAL Take 20 mg by mouth 4 (four) times daily.   budesonide-formoterol 160-4.5 MCG/ACT inhaler Commonly known as: SYMBICORT Inhale 2 puffs into the lungs 2 (two) times daily.   citalopram 20 MG tablet Commonly known as: CELEXA Take 20 mg by mouth daily.   diazepam 2 MG tablet Commonly known as: VALIUM Take 1 tablet (2 mg total) by mouth 2 (two) times daily.   docusate sodium 100 MG capsule Commonly known as: COLACE Take 200 mg by mouth daily.   fenofibrate 160 MG tablet Take 160 mg by mouth daily.   fluticasone 50 MCG/ACT nasal spray Commonly known as: FLONASE Place 1 spray into both nostrils See admin instructions. Use 1 spray in each nostril daily. Use 1 spray in each nostril daily as needed for rhinitis   furosemide 20 MG tablet Commonly known as: LASIX Take 20 mg by mouth daily.   ipratropium-albuterol 0.5-2.5 (3) MG/3ML Soln Commonly known as: DUONEB Take 3 mLs by nebulization every 6 (six) hours as needed (for wheezing).   labetalol 300 MG tablet Commonly known as: NORMODYNE Take 1 tablet (300 mg total) by mouth 3 (three) times daily.   lisinopril 10 MG tablet Commonly known as: ZESTRIL Take 10 mg by mouth daily.   multivitamin with minerals tablet Take 1 tablet by mouth daily.   omeprazole 20 MG capsule Commonly known as: PRILOSEC Take 20 mg by mouth every other day.   oxyCODONE-acetaminophen 7.5-325 MG tablet Commonly known as: PERCOCET Take 1 tablet by mouth every 8 (eight) hours as needed for severe pain.   polyethylene glycol 17 g packet Commonly known as: MIRALAX / GLYCOLAX Take 17 g by mouth See admin instructions. Mix 17 g and drink once daily. Mix 17 g and drink once daily as needed for constipation   tamsulosin 0.4 MG Caps capsule Commonly known as: FLOMAX Take 0.4 mg by mouth daily.   traZODone 50 MG tablet Commonly known as: DESYREL Take 50 mg  by mouth at bedtime.   Vitamin D3 1.25 MG (50000 UT) Tabs Take 1 tablet by mouth See admin instructions. Give 1 tablet by mouth on the 22nd of every month for supplement.      Follow-up Information    Hague, Rosalyn Charters, MD Follow up.   Specialty: Internal Medicine Contact information: [address] [place] [address] [phone]          No Known Allergies  Consultations:  None  Procedures/Studies: No CXR performed at this facility.  Subjective: No complaints. Dyspnea improved. No fevers.  Discharge Exam: Vitals:   09/17/19 0439 09/17/19 0756  BP: 122/82 (!) 132/96  Pulse: (!) 55 80  Resp: 20 18  Temp: 97.9 F (36.6 C) 98.8 F (37.1 C)  SpO2: 98% 98%   General: No distress Cardiovascular: Regular, no edema Respiratory: Nonlabored and clear  Labs: Basic Metabolic Panel: Recent Labs  Lab 09/13/19 0047 09/14/19 0230 09/15/19 0149 09/16/19 0445 09/17/19 0618  NA 140 140 141 141 139  K 3.3* 3.5 2.9* 3.5 3.4*  CL 108 107 109 109 108  CO2 22 20* 21* 20* 21*  GLUCOSE 99 83 106* 101* 105*  BUN 13 12 14 11 9   CREATININE 0.76 0.70 0.66 0.69 0.65  CALCIUM 8.9 9.1 8.8* 8.9 8.9  MG  --   --   --  2.2  --    Liver Function Tests: Recent Labs  Lab 09/13/19 0047 09/14/19 0230 09/15/19 0149 09/16/19 0445 09/17/19 0618  AST 82* 63* 61* 62* 51*  ALT 60* 52* 47* 47* 42  ALKPHOS 39 42 41 47 51  BILITOT 0.8 0.8 0.5 0.8 0.8  PROT 7.2 7.5 7.1 7.3 7.0  ALBUMIN 3.8 3.9 3.8 4.0 3.8   CBC: Recent Labs  Lab 09/13/19 0047 09/14/19 0230 09/15/19 0149 09/16/19 0445 09/17/19 0618  WBC 4.0 4.0 4.5 5.3 5.8  NEUTROABS 2.6 2.4 2.8 3.7 4.1  HGB 13.5 13.9 13.4 14.0 13.6  HCT 41.4 43.1 41.5 43.3 41.9  MCV 93.5 92.5 93.7 92.9 93.1  PLT 109* 144* 162 168 155   D-Dimer Recent Labs    09/16/19 0445 09/17/19 0618  DDIMER 0.40 0.42   Anemia work up Recent Labs    09/15/19 0149  FERRITIN 516*   Urinalysis    Component Value Date/Time    COLORURINE AMBER (A) 06/12/2015 1733   APPEARANCEUR CLOUDY (A) 06/12/2015 1733   LABSPEC 1.030 06/12/2015 1733   PHURINE 5.5 06/12/2015 1733   GLUCOSEU 100 (A) 06/12/2015 1733   HGBUR LARGE (A) 06/12/2015 1733   BILIRUBINUR NEGATIVE 06/12/2015 1733   KETONESUR 15 (A) 06/12/2015 1733   PROTEINUR 100 (A) 06/12/2015 1733   UROBILINOGEN 1.0 06/12/2015 1733   NITRITE NEGATIVE 06/12/2015 [place] NEGATIVE 06/12/2015 1733    Microbiology Recent Results (from the past 240 hour(s))  MRSA PCR Screening     Status: None   Collection Time: 09/12/19  8:20 PM   Specimen: Nasal Mucosa; Nasopharyngeal  Result Value Ref Range Status   MRSA by PCR NEGATIVE NEGATIVE Final    Comment:        The GeneXpert MRSA Assay (FDA approved for NASAL specimens only), is one component of a comprehensive MRSA colonization surveillance program. It is not intended to diagnose MRSA infection nor to guide or monitor treatment for MRSA infections. Performed at [facility], [place] [address], [place], [place] [address]     Time coordinating discharge: Approximately 40 minutes  [name], MD  [facility] 09/17/2019, 8:34 AM

## 2020-12-22 ENCOUNTER — Encounter: Payer: Self-pay | Admitting: Neurology

## 2020-12-22 ENCOUNTER — Telehealth: Payer: Self-pay | Admitting: *Deleted

## 2020-12-22 ENCOUNTER — Ambulatory Visit: Payer: Medicare Other | Admitting: Neurology

## 2020-12-22 NOTE — Telephone Encounter (Signed)
No showed new patient appointment. 

## 2021-01-14 DIAGNOSIS — J189 Pneumonia, unspecified organism: Secondary | ICD-10-CM | POA: Diagnosis not present

## 2021-02-11 ENCOUNTER — Encounter: Payer: Self-pay | Admitting: *Deleted

## 2021-02-12 ENCOUNTER — Ambulatory Visit (INDEPENDENT_AMBULATORY_CARE_PROVIDER_SITE_OTHER): Payer: Medicare Other | Admitting: Neurology

## 2021-02-12 ENCOUNTER — Other Ambulatory Visit: Payer: Self-pay

## 2021-02-12 ENCOUNTER — Encounter: Payer: Self-pay | Admitting: Neurology

## 2021-02-12 VITALS — BP 99/60 | HR 68 | Ht 72.0 in | Wt 162.0 lb

## 2021-02-12 DIAGNOSIS — G8112 Spastic hemiplegia affecting left dominant side: Secondary | ICD-10-CM | POA: Diagnosis not present

## 2021-02-12 DIAGNOSIS — I619 Nontraumatic intracerebral hemorrhage, unspecified: Secondary | ICD-10-CM | POA: Diagnosis not present

## 2021-02-12 NOTE — Progress Notes (Signed)
Chief Complaint  Patient presents with  . New Patient (Initial Visit)    Pt with transportation from [employer]. He has a referral that was sent to discuss botox to legs.       ASSESSMENT AND PLAN  Mike Rowe is a 65 y.o. male   Right thalamic bleeding in August 2016 History of cocaine, alcohol abuse Residual spastic left hemiparesis,  He has fixed contraction of left upper extremity, left lower extremity, even his right lower extremity because of maintaining in the same position, painful resting and worse with passive stretch  Discussed with patient, he wants to give a trial of botulism toxin injection to alleviate the spasm and pain  Preauthorization ulceration of Xeomin 600 unit, return to clinic 4 to 6 weeks for electrical stimulation guided injection  DIAGNOSTIC DATA (LABS, IMAGING, TESTING) - I reviewed patient records, labs, notes, testing and imaging myself where available.  CT head without contrast in August 2016: 4.6 x 3.3 cm right thalamus intraparenchymal hematoma with surrounding vasogenic edema, intraventricular extension, evidence of hydrocephalus  Ultrasound of abdomen December 2017, stable cirrhotic change of the liver,  Laboratory evaluation in 2020: CMP, normal creatinine 0.65, mild elevated AST 51, normal CBC hemoglobin 13.6,   HISTORICAL  Mike Rowe  is a 65 year old male,seen in request by his primary care physician Dr. London Pepper, Wyatt Mage P  for evaluation of botulism toxin injection for spastic left hemiparesis, he is currently a nursing home resident, brought in by staff at initial visit on February 12, 2021  [place],  [place] [address], Rica Koyanagi, MD  I reviewed and summarized the referring note.  Past medical history Hyperlipidemia Hypertension Depression History of cocaine abuse, alcohol dependence, Large sized right thalamic hemorrhage with intraventricular extension in 2016  He had a history of cocaine, alcohol abuse, suffered large size right thalamic  intraparenchymal hemorrhage in August 2016, with extension to the intraventricular space, evidence of hydrocephalus during acute phase, he became nursing home resident, tends to stay in a fixed position, hemireclined, fixed contraction of left upper, lower extremity, even his right leg now developed fixed contraction, maximum extension 90 degree, of right knee, and right ankle, complains of pain with passive stretching,  He also complains of significant pain of left shoulder, left elbow, finger, left lower extremity,  After discussed with patient, he does want to have a trial of botulism toxin injection to alleviate his spasm and pain,  REVIEW OF SYSTEMS: Full 14 system review of systems performed and notable only for as above All other review of systems were negative.  PHYSICAL EXAM   Vitals:   02/12/21 0839  BP: 99/60  Pulse: 68  Weight: 162 lb (73.5 kg)  Height: 6' (1.829 m)   Not recorded     Body mass index is 21.97 kg/m.  PHYSICAL EXAMNIATION:  Gen: NAD, conversant, well nourised, well groomed                     Cardiovascular: Regular rate rhythm, no peripheral edema, warm, nontender. Eyes: Conjunctivae clear without exudates or hemorrhage Neck: Supple, no carotid bruits. Pulmonary: Clear to auscultation bilaterally   NEUROLOGICAL EXAM:  MENTAL STATUS: Speech:    Speech is normal; fluent and spontaneous with normal comprehension.  Cognition:     Orientation to time, place and person     Normal recent and remote memory     Normal Attention span and concentration     Normal Language, naming, repeating,spontaneous speech  Fund of knowledge   CRANIAL NERVES: CN II: Visual fields are full to confrontation. Pupils are round equal and briskly reactive to light. CN III, IV, VI: extraocular movement are normal. No ptosis. CN V: Facial sensation is intact to light touch CN VII: Left lower face weakness CN VIII: Hearing is normal to causal conversation. CN IX, X:  Phonation is normal. CN XI: Head turning and shoulder shrug are intact  MOTOR: Severe spastic left hemiparesis, patient stayed in his recliner, with fixed contraction of left shoulder anterior rotation, abduction, elbow flexion, wrist flexion, finger flexion, with passive movement, he complains of pain, maximum shoulder abduction 70 degrees, elbow extension 45 degrees,  He also has fixed contraction of his good right leg, maximum right knee extension 90 degrees, right ankle dorsiflexion 90 degrees, pain with passive stretch  He has significant left lower extremity adduction, bend towards his abdomen and right side, contraction of left knee,  REFLEXES: Hyperreflexia on the left side  SENSORY: Symmetric to light touch vibratory sensation  COORDINATION: There is no trunk or limb dysmetria noted.  GAIT/STANCE: Deferred  ALLERGIES: No Known Allergies  HOME MEDICATIONS: Current Outpatient Medications  Medication Sig Dispense Refill  . acetaminophen (TYLENOL) 325 MG tablet Take 650 mg by mouth every 6 (six) hours as needed for mild pain or moderate pain.    Marland Kitchen albuterol (PROVENTIL) (2.5 MG/3ML) 0.083% nebulizer solution Take by nebulization.    Marland Kitchen amLODipine (NORVASC) 10 MG tablet Take 10 mg by mouth daily.    Marland Kitchen atorvastatin (LIPITOR) 10 MG tablet Take 10 mg by mouth at bedtime.    . budesonide-formoterol (SYMBICORT) 160-4.5 MCG/ACT inhaler Inhale 2 puffs into the lungs 2 (two) times daily.    . Cholecalciferol (VITAMIN D3) 1.25 MG (50000 UT) TABS Take 1 tablet by mouth See admin instructions. Give 1 tablet by mouth on the 22nd of every month for supplement.    . citalopram (CELEXA) 40 MG tablet Take 40 mg by mouth daily.    . diazepam (VALIUM) 2 MG tablet Take 1 tablet (2 mg total) by mouth 2 (two) times daily. (Patient taking differently: Take 2 mg by mouth every 8 (eight) hours as needed.) 6 tablet 0  . docusate sodium (COLACE) 100 MG capsule Take 200 mg by mouth daily.     .  fenofibrate (TRICOR) 48 MG tablet Take 48 mg by mouth daily.    . fluticasone (FLONASE) 50 MCG/ACT nasal spray Place 1 spray into both nostrils See admin instructions. Use 1 spray in each nostril daily. Use 1 spray in each nostril daily as needed for rhinitis    . furosemide (LASIX) 20 MG tablet Take 20 mg by mouth daily.    Marland Kitchen labetalol (NORMODYNE) 300 MG tablet Take 1 tablet (300 mg total) by mouth 3 (three) times daily. 100 tablet 2  . lisinopril (PRINIVIL,ZESTRIL) 10 MG tablet Take 10 mg by mouth daily.    Marland Kitchen oxyCODONE (OXY IR/ROXICODONE) 5 MG immediate release tablet Take by mouth.    . polyethylene glycol (MIRALAX / GLYCOLAX) packet Take 17 g by mouth See admin instructions. Mix 17 g and drink once daily. Mix 17 g and drink once daily as needed for constipation    . senna (SENOKOT) 8.6 MG tablet Take 1 tablet by mouth daily.    . tamsulosin (FLOMAX) 0.4 MG CAPS capsule Take 0.4 mg by mouth daily.    . traZODone (DESYREL) 50 MG tablet Take 50 mg by mouth at bedtime.  No current facility-administered medications for this visit.    PAST MEDICAL HISTORY: Past Medical History:  Diagnosis Date  . Allergy   . Anxiety   . Asthma   . Cerebral infarction ([place])   . CHF (congestive heart failure) (HCC)    Diastolic Heart failure per progress note from [facility] 02/16/18  . Contracture of ankle, left    per progress note from [facility]-02/16/2018  . Contracture of hip, left    per progress note from [facility] 02/16/2018  . Contracture of knee joint, right 10/09/2017  . Diabetes ([place])   . Dysphagia   . GERD (gastroesophageal reflux disease)    per progress note from [name] 02/16/2018  . Hemiplegia and hemiparesis 03/01/2016   following cerebral infarction affecting left non-dominent side- per progress note from [facility]  . Hepatitis C virus    Hepatitis C  . Hypertension   . Major depressive disorder    . Stroke ([place]) 06/27/2015   cerebral infarction   . Substance abuse ([place])     PAST SURGICAL HISTORY: Past Surgical History:  Procedure Laterality Date  . COLONOSCOPY WITH PROPOFOL N/A 12/13/2016   Procedure: COLONOSCOPY WITH PROPOFOL;  Surgeon: Jerene Bears, MD;  Location: [facility];  Service: Gastroenterology;  Laterality: N/A;  . COLONOSCOPY WITH PROPOFOL N/A 04/03/2018   Procedure: COLONOSCOPY WITH PROPOFOL;  Surgeon: Jerene Bears, MD;  Location: [facility];  Service: Gastroenterology;  Laterality: N/A;  . ESOPHAGOGASTRODUODENOSCOPY (EGD) WITH PROPOFOL N/A 12/13/2016   Procedure: ESOPHAGOGASTRODUODENOSCOPY (EGD) WITH PROPOFOL;  Surgeon: Jerene Bears, MD;  Location: [facility];  Service: Gastroenterology;  Laterality: N/A;  . LAPAROTOMY N/A 06/28/2015   Procedure: EXPLORATORY LAPAROTOMY;  Surgeon: Georganna Skeans, MD;  Location: [place];  Service: General;  Laterality: N/A;  . PEG PLACEMENT N/A 06/25/2015   Procedure: PERCUTANEOUS ENDOSCOPIC GASTROSTOMY (PEG) PLACEMENT;  Surgeon: Georganna Skeans, MD;  Location: [place];  Service: General;  Laterality: N/A;  . PEG PLACEMENT N/A 06/28/2015   Procedure: PERCUTANEOUS ENDOSCOPIC GASTROSTOMY (PEG) PLACEMENT;  Surgeon: Georganna Skeans, MD;  Location: [place];  Service: General;  Laterality: N/A;  . POLYPECTOMY  04/03/2018   Procedure: POLYPECTOMY;  Surgeon: Jerene Bears, MD;  Location: [facility];  Service: Gastroenterology;;    FAMILY HISTORY: Family History  Problem Relation Age of Onset  . Colon cancer Neg Hx   . Stomach cancer Neg Hx   . Rectal cancer Neg Hx   . Liver cancer Neg Hx   . Esophageal cancer Neg Hx     SOCIAL HISTORY: Social History   Socioeconomic History  . Marital status: Single    Spouse name: Not on file  . Number of children: 2  . Years of education: Not on file  . Highest education level: Not on file  Occupational History  . Occupation: disabled  Tobacco Use  . Smoking status: Former Research scientist (life sciences)  .  Smokeless tobacco: Never Used  Vaping Use  . Vaping Use: Never used  Substance and Sexual Activity  . Alcohol use: Not Currently  . Drug use: Yes    Types: Cocaine    Comment: Unable to assess as patient is non verbal at this time.  Marland Kitchen Sexual activity: Not on file    Comment: Patient non verbal at this time.  Other Topics Concern  . Not on file  Social History Narrative  . Not on file   Social Determinants of Health  Financial Resource Strain: Not on file  Food Insecurity: Not on file  Transportation Needs: Not on file  Physical Activity: Not on file  Stress: Not on file  Social Connections: Not on file  Intimate Partner Violence: Not on file      Marcial Pacas, M.D. Ph.D.  [facility] [address], [place], [place] [address] Ph: [phone] Fax: [phone]  CC:  Bonnita Nasuti, MD [address] [place],  [place] [address]  Rica Koyanagi, MD

## 2021-02-25 ENCOUNTER — Telehealth: Payer: Self-pay | Admitting: Neurology

## 2021-02-25 NOTE — Telephone Encounter (Signed)
Received signed patient consent (given to medical records) and charge sheet for 600 units of Xeomin (Q1975) for spastic hemiplegia (88325, [id number], [id number], 49826, 41583, 09407, 68088: G81.12, G81.14). Patient has Medicare A & B which does not require PA. I called patient's contact number which is [place]. I was routed to a Advertising account executive for [employer]. Requested she return my call to schedule appointment for patient.

## 2021-05-02 ENCOUNTER — Telehealth: Payer: Self-pay | Admitting: *Deleted

## 2021-05-02 NOTE — Telephone Encounter (Signed)
I called and left patient's guardian on DPR, Mike Rowe, a message letting her know Dr. Krista Blue will be out of the office on 05/03/21. We need to reschedule Mike Rowe's appt. I provided our office hours and phone number.

## 2021-05-03 ENCOUNTER — Ambulatory Visit: Payer: Medicare Other | Admitting: Neurology

## 2021-05-03 NOTE — Telephone Encounter (Signed)
Patient never returned my call to schedule Xeomin injection.

## 2021-07-04 ENCOUNTER — Encounter: Payer: Self-pay | Admitting: Internal Medicine

## 2021-09-10 ENCOUNTER — Other Ambulatory Visit: Payer: Self-pay | Admitting: Specialist

## 2021-09-10 DIAGNOSIS — C220 Liver cell carcinoma: Secondary | ICD-10-CM

## 2021-09-30 ENCOUNTER — Other Ambulatory Visit: Payer: Self-pay

## 2021-09-30 ENCOUNTER — Encounter: Payer: Self-pay | Admitting: *Deleted

## 2021-09-30 ENCOUNTER — Ambulatory Visit
Admission: RE | Admit: 2021-09-30 | Discharge: 2021-09-30 | Disposition: A | Discharge status: Home / Self Care | Payer: Medicare Other | Source: Ambulatory Visit | Attending: Specialist | Admitting: Specialist

## 2021-09-30 DIAGNOSIS — C220 Liver cell carcinoma: Secondary | ICD-10-CM

## 2021-09-30 HISTORY — PX: IR RADIOLOGIST EVAL & MGMT: IMG5224

## 2021-09-30 NOTE — H&P (Signed)
Chief Complaint: Patient was seen in consultation today for hepatocellular carcinoma ([place])  at the request of [place]  Referring Physician(s): Misenheimer,Timothy  History of Present Illness: Mike Rowe is a 65 y.o. male comorbid with past medical history significant for substance abuse (cocaine and alcohol) who suffered a large right hemorrhagic CVA in 06/2015 resulting in severe left hemiparesis.The patient is a resident at [facility], and this consultation was performed virtually due to patient's transportation challenges.  I connected with Mike Rowe on @TODAY @ at  2:30 PM EST by telephone and verified that I am speaking with the correct person using two identifiers.  The patient has known alcoholic cirrhosis followed by serial ultrasound and was found to have indeterminate liver lesions on [place] 08/17/2021.  A CT AP was performed on 08/21/2021 demonstrating a 3.3 cm right hepatic lobe mass with an additional subcentimeter lesion.  Multiphasic MR was recommended however secondary to patient's physical status, core biopsy was performed on 08/26/2021 confirming hepatocellular carcinoma.  Patient was seen by [facility] health gastroenterologist (Dr. Lyda Jester) and was recommended for local regional therapy as he may not be a transplant candidate. In our conversation, Mike Rowe understood his diagnosis and current treatment options.  Review of Systems: A 12 point ROS discussed and pertinent positives are indicated in the HPI above.  All other systems are negative.  Past Medical History:  Diagnosis Date   Allergy    Anxiety    Asthma    Cerebral infarction ([place])    CHF (congestive heart failure) ([place])    Diastolic Heart failure per progress note from [facility] and Rehab 02/16/18   Contracture of ankle, left    per progress note from [facility] and Rehab-02/16/2018   Contracture of hip, left    per progress note from [facility]  and Rehab 02/16/2018   Contracture of knee joint, right 10/09/2017   Diabetes ([place])    Dysphagia    GERD (gastroesophageal reflux disease)    per progress note from [facility] and Rehab 02/16/2018   Hemiplegia and hemiparesis 03/01/2016   following cerebral infarction affecting left non-dominent side- per progress note from [facility] and [facility]   Hepatitis C virus    Hepatitis C   Hypertension    Major depressive disorder    Stroke ([place]) 06/27/2015   cerebral infarction    Substance abuse [facility])     Past Surgical History:  Procedure Laterality Date   COLONOSCOPY WITH PROPOFOL N/A 12/13/2016   Procedure: COLONOSCOPY WITH PROPOFOL;  Surgeon: Jerene Bears, MD;  [facility];  Service: Gastroenterology;  Laterality: N/A;   COLONOSCOPY WITH PROPOFOL N/A 04/03/2018   Procedure: COLONOSCOPY WITH PROPOFOL;  Surgeon: Jerene Bears, MD;  [facility];  Service: Gastroenterology;  Laterality: N/A;   ESOPHAGOGASTRODUODENOSCOPY (EGD) WITH PROPOFOL N/A 12/13/2016   Procedure: ESOPHAGOGASTRODUODENOSCOPY (EGD) WITH PROPOFOL;  Surgeon: Jerene Bears, MD;  [facility];  Service: Gastroenterology;  Laterality: N/A;   IR RADIOLOGIST EVAL & MGMT  09/30/2021   LAPAROTOMY N/A 06/28/2015   Procedure: EXPLORATORY LAPAROTOMY;  Surgeon: Georganna Skeans, MD;  Location: [place];  Service: General;  Laterality: N/A;   PEG PLACEMENT N/A 06/25/2015   Procedure: PERCUTANEOUS ENDOSCOPIC GASTROSTOMY (PEG) PLACEMENT;  Surgeon: Georganna Skeans, MD;  Location: [place];  Service: General;  Laterality: N/A;   PEG PLACEMENT N/A 06/28/2015   Procedure: PERCUTANEOUS ENDOSCOPIC GASTROSTOMY (PEG) PLACEMENT;  Surgeon: Georganna Skeans, MD;  Location: [place];  Service:  General;  Laterality: N/A;   POLYPECTOMY  04/03/2018   Procedure: POLYPECTOMY;  Surgeon: Jerene Bears, MD;  [facility];  Service: Gastroenterology;;    Allergies: Patient has no known allergies.  Medications: Prior  to Admission medications   Medication Sig Start Date End Date Taking? Authorizing Provider  acetaminophen (TYLENOL) 325 MG tablet Take 650 mg by mouth every 6 (six) hours as needed for mild pain or moderate pain.    [provider]  albuterol (PROVENTIL) (2.5 MG/3ML) 0.083% nebulizer solution Take by nebulization. 01/18/21   [provider]  amLODipine (NORVASC) 10 MG tablet Take 10 mg by mouth daily.    [provider]  atorvastatin (LIPITOR) 10 MG tablet Take 10 mg by mouth at bedtime.    [provider]  budesonide-formoterol (SYMBICORT) 160-4.5 MCG/ACT inhaler Inhale 2 puffs into the lungs 2 (two) times daily.    [provider]  Cholecalciferol (VITAMIN D3) 1.25 MG (50000 UT) TABS Take 1 tablet by mouth See admin instructions. Give 1 tablet by mouth on the 22nd of every month for supplement.    [provider]  citalopram (CELEXA) 40 MG tablet Take 40 mg by mouth daily.    [provider]  diazepam (VALIUM) 2 MG tablet Take 1 tablet (2 mg total) by mouth 2 (two) times daily. Patient taking differently: Take 2 mg by mouth every 8 (eight) hours as needed. 09/17/19   [name], MD  docusate sodium (COLACE) 100 MG capsule Take 200 mg by mouth daily.     [provider]  fenofibrate (TRICOR) 48 MG tablet Take 48 mg by mouth daily.    [provider]  fluticasone (FLONASE) 50 MCG/ACT nasal spray Place 1 spray into both nostrils See admin instructions. Use 1 spray in each nostril daily. Use 1 spray in each nostril daily as needed for rhinitis    [provider]  furosemide (LASIX) 20 MG tablet Take 20 mg by mouth daily.    [provider]  labetalol (NORMODYNE) 300 MG tablet Take 1 tablet (300 mg total) by mouth 3 (three) times daily. 06/25/15   Garvin Fila, MD  lisinopril (PRINIVIL,ZESTRIL) 10 MG tablet Take 10 mg by mouth daily.    [provider]  oxyCODONE (OXY IR/ROXICODONE) 5 MG  immediate release tablet Take by mouth. 02/09/21   [provider]  polyethylene glycol (MIRALAX / GLYCOLAX) packet Take 17 g by mouth See admin instructions. Mix 17 g and drink once daily. Mix 17 g and drink once daily as needed for constipation    [provider]  senna (SENOKOT) 8.6 MG tablet Take 1 tablet by mouth daily.    [provider]  tamsulosin (FLOMAX) 0.4 MG CAPS capsule Take 0.4 mg by mouth daily.    [provider]  traZODone (DESYREL) 50 MG tablet Take 50 mg by mouth at bedtime.    [provider]     Family History  Problem Relation Age of Onset   Colon cancer Neg Hx    Stomach cancer Neg Hx    Rectal cancer Neg Hx    Liver cancer Neg Hx    Esophageal cancer Neg Hx     Social History   Socioeconomic History   Marital status: Single    Spouse name: Not on file   Number of children: 2   Years of education: Not on file   Highest education level: Not on file  Occupational History  Occupation: disabled  Tobacco Use   Smoking status: Former   Smokeless tobacco: Never  Scientific laboratory technician Use: Never used  Substance and Sexual Activity   Alcohol use: Not Currently   Drug use: Yes    Types: Cocaine    Comment: Unable to assess as patient is non verbal at this time.   Sexual activity: Not on file    Comment: Patient non verbal at this time.  Other Topics Concern   Not on file  Social History Narrative   Not on file   Social Determinants of Health   Financial Resource Strain: Not on file  Food Insecurity: Not on file  Transportation Needs: Not on file  Physical Activity: Not on file  Stress: Not on file  Social Connections: Not on file    ECOG Status: 4 - Bedbound   Review of Systems  Vital Signs: There were no vitals taken for this visit.  Physical Exam Not performed, secondary to virtual visit   Imaging: CT AP 08/21/2021, [place] 08/17/2021 and US biopsy, 08/26/2021 Independently reviewed, demonstrating  multifocal right hepatic lesions, largest measuring 3.3 cm.    Labs:  CBC: No results for input(s): WBC, HGB, HCT, PLT in the last 8760 hours.  COAGS: No results for input(s): INR, APTT in the last 8760 hours.  BMP: No results for input(s): NA, K, CL, CO2, GLUCOSE, BUN, CALCIUM, CREATININE, GFRNONAA, GFRAA in the last 8760 hours.  Invalid input(s): CMP  LIVER FUNCTION TESTS: No results for input(s): BILITOT, AST, ALT, ALKPHOS, PROT, ALBUMIN in the last 8760 hours.  TUMOR MARKERS: No results for input(s): AFPTM, CEA, CA199, CHROMGRNA in the last 8760 hours.  Assessment and Plan:  Assessment  Plan: Mike Rowe is a 65 y.o. 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  Patient is debilitated and paretic status post large right hemorrhagic CVA, and his gastroenterologist believes that he may not be a transplant candidate.  He is interested in pursuing a minimally-invasive option for local regional [place] control and we discussed transarterial chemoembolization (TACE).  The patient understood that this is not curative and may help control his disease burden.    The procedure has been fully reviewed with the patient/patient's authorized representative. The risks, benefits and alternatives have been explained, and the patient/patient's authorized representative has consented to the procedure. -- The patient will accept blood products in an emergent situation. -- The patient does not have a Do Not Resuscitate order in effect.  *CT AP (08/26/2021) already performed and reviewed. No additional imaging required. *Proceed to schedule based on mutual availability. *Will plan for right trans-femoral access, secondary to Pt contractures. *Same day procedure, no overnight admission. *Will obtain MELD labs (CBC, CMP, INR) on procedure day arrival.   Michaelle Birks, MD Vascular and Interventional Radiology  Specialists [facility] Radiology   Pager. [phone] Clinic. [phone]  I discussed the limitations, risks, security and privacy concerns of performing an evaluation and management service by telephone and the availability of in person appointments. I also discussed with the patient that there may be a patient responsible charge related to this service. The patient expressed understanding and agreed to proceed.  Thank you for this interesting consult.  I greatly enjoyed meeting Mike Rowe and look forward to participating in their care.  A copy of this report was sent to the requesting provider on this date.  I spent a total of 40  Minutes of non-face-to-face time during this encounter, greater than 50% of which was counseling/coordinating care for treatment management for hepatocellular carcinoma.

## 2021-10-04 ENCOUNTER — Other Ambulatory Visit (HOSPITAL_COMMUNITY): Payer: Self-pay | Admitting: Interventional Radiology

## 2021-10-04 DIAGNOSIS — C220 Liver cell carcinoma: Secondary | ICD-10-CM

## 2021-10-04 MED ORDER — LC BEADS 100-300UM IN SALINE
100.0000 mg | Freq: Once | Status: DC
  Filled 2021-10-04: qty 50

## 2021-10-19 ENCOUNTER — Other Ambulatory Visit: Payer: Self-pay | Admitting: Radiology

## 2021-10-20 ENCOUNTER — Other Ambulatory Visit (HOSPITAL_COMMUNITY): Payer: Self-pay | Admitting: Interventional Radiology

## 2021-10-20 ENCOUNTER — Encounter (HOSPITAL_COMMUNITY): Payer: Self-pay

## 2021-10-20 ENCOUNTER — Ambulatory Visit (HOSPITAL_COMMUNITY)
Admission: RE | Admit: 2021-10-20 | Discharge: 2021-10-20 | Disposition: A | Discharge status: Home / Self Care | Payer: Medicare Other | Source: Ambulatory Visit | Attending: Interventional Radiology | Admitting: Interventional Radiology

## 2021-10-20 ENCOUNTER — Other Ambulatory Visit: Payer: Self-pay

## 2021-10-20 VITALS — BP 123/84 | HR 72 | Temp 98.1°F | Resp 18

## 2021-10-20 DIAGNOSIS — F32A Depression, unspecified: Secondary | ICD-10-CM | POA: Insufficient documentation

## 2021-10-20 DIAGNOSIS — C220 Liver cell carcinoma: Secondary | ICD-10-CM

## 2021-10-20 DIAGNOSIS — E119 Type 2 diabetes mellitus without complications: Secondary | ICD-10-CM | POA: Diagnosis not present

## 2021-10-20 DIAGNOSIS — Z01818 Encounter for other preprocedural examination: Secondary | ICD-10-CM

## 2021-10-20 DIAGNOSIS — I110 Hypertensive heart disease with heart failure: Secondary | ICD-10-CM | POA: Diagnosis not present

## 2021-10-20 DIAGNOSIS — I69354 Hemiplegia and hemiparesis following cerebral infarction affecting left non-dominant side: Secondary | ICD-10-CM | POA: Insufficient documentation

## 2021-10-20 DIAGNOSIS — F419 Anxiety disorder, unspecified: Secondary | ICD-10-CM | POA: Diagnosis not present

## 2021-10-20 DIAGNOSIS — I5032 Chronic diastolic (congestive) heart failure: Secondary | ICD-10-CM | POA: Insufficient documentation

## 2021-10-20 HISTORY — PX: IR US GUIDE VASC ACCESS RIGHT: IMG2390

## 2021-10-20 HISTORY — PX: IR ANGIOGRAM SELECTIVE EACH ADDITIONAL VESSEL: IMG667

## 2021-10-20 HISTORY — PX: IR EMBO TUMOR ORGAN ISCHEMIA INFARCT INC GUIDE ROADMAPPING: IMG5449

## 2021-10-20 HISTORY — PX: IR ANGIOGRAM VISCERAL SELECTIVE: IMG657

## 2021-10-20 LAB — CBC
HCT: 46.1 % (ref 39.0–52.0)
Hemoglobin: 15.4 g/dL (ref 13.0–17.0)
MCH: 30.3 pg (ref 26.0–34.0)
MCHC: 33.4 g/dL (ref 30.0–36.0)
MCV: 90.7 fL (ref 80.0–100.0)
Platelets: 151 10*3/uL (ref 150–400)
RBC: 5.08 MIL/uL (ref 4.22–5.81)
RDW: 12.4 % (ref 11.5–15.5)
WBC: 8.9 10*3/uL (ref 4.0–10.5)
nRBC: 0 % (ref 0.0–0.2)

## 2021-10-20 LAB — COMPREHENSIVE METABOLIC PANEL
ALT: 54 U/L — ABNORMAL HIGH (ref 0–44)
AST: 43 U/L — ABNORMAL HIGH (ref 15–41)
Albumin: 4.6 g/dL (ref 3.5–5.0)
Alkaline Phosphatase: 49 U/L (ref 38–126)
Anion gap: 11 (ref 5–15)
BUN: 26 mg/dL — ABNORMAL HIGH (ref 8–23)
CO2: 22 mmol/L (ref 22–32)
Calcium: 9.2 mg/dL (ref 8.9–10.3)
Chloride: 104 mmol/L (ref 98–111)
Creatinine, Ser: 1.08 mg/dL (ref 0.61–1.24)
GFR, Estimated: >60 mL/min (ref >60)
Glucose, Bld: 135 mg/dL — ABNORMAL HIGH (ref 70–99)
Potassium: 4.1 mmol/L (ref 3.5–5.1)
Sodium: 137 mmol/L (ref 135–145)
Total Bilirubin: 0.9 mg/dL (ref 0.3–1.2)
Total Protein: 7.9 g/dL (ref 6.5–8.1)

## 2021-10-20 LAB — BASIC METABOLIC PANEL
Anion gap: 11 (ref 5–15)
BUN: 24 mg/dL — ABNORMAL HIGH (ref 8–23)
CO2: 22 mmol/L (ref 22–32)
Calcium: 9.2 mg/dL (ref 8.9–10.3)
Chloride: 103 mmol/L (ref 98–111)
Creatinine, Ser: 1.02 mg/dL (ref 0.61–1.24)
GFR, Estimated: >60 mL/min (ref >60)
Glucose, Bld: 142 mg/dL — ABNORMAL HIGH (ref 70–99)
Potassium: 4.1 mmol/L (ref 3.5–5.1)
Sodium: 136 mmol/L (ref 135–145)

## 2021-10-20 LAB — TYPE AND SCREEN
ABO/RH(D): B POS
Antibody Screen: NEGATIVE

## 2021-10-20 LAB — PROTIME-INR
INR: 1 (ref 0.8–1.2)
Prothrombin Time: 13.1 seconds (ref 11.4–15.2)

## 2021-10-20 MED ORDER — LEVOFLOXACIN 500 MG PO TABS: 500.0000 mg | ORAL_TABLET | Freq: Every day | ORAL | 0 refills | Status: AC

## 2021-10-20 MED ORDER — MIDAZOLAM HCL 2 MG/2ML IJ SOLN
INTRAMUSCULAR | Status: AC | PRN
  Administered 2021-10-20: 1 mg via INTRAVENOUS

## 2021-10-20 MED ORDER — LIDOCAINE HCL (PF) 1 % IJ SOLN
INTRAMUSCULAR | Status: AC | PRN
  Administered 2021-10-20: 10 mL via INTRADERMAL

## 2021-10-20 MED ORDER — LIDOCAINE HCL 1 % IJ SOLN
INTRAMUSCULAR | Status: AC
  Filled 2021-10-20: qty 20

## 2021-10-20 MED ORDER — FENTANYL CITRATE (PF) 100 MCG/2ML IJ SOLN
INTRAMUSCULAR | Status: AC | PRN
  Administered 2021-10-20: 50 ug via INTRAVENOUS

## 2021-10-20 MED ORDER — DEXAMETHASONE SODIUM PHOSPHATE 10 MG/ML IJ SOLN
10.0000 mg | Freq: Once | INTRAMUSCULAR | Status: AC
  Administered 2021-10-20: 10 mg via INTRAVENOUS
  Filled 2021-10-20: qty 1

## 2021-10-20 MED ORDER — PIPERACILLIN-TAZOBACTAM 3.375 G IVPB
INTRAVENOUS | Status: AC
  Filled 2021-10-20: qty 50

## 2021-10-20 MED ORDER — FENTANYL CITRATE (PF) 100 MCG/2ML IJ SOLN
INTRAMUSCULAR | Status: AC
  Filled 2021-10-20: qty 2

## 2021-10-20 MED ORDER — MIDAZOLAM HCL 2 MG/2ML IJ SOLN
INTRAMUSCULAR | Status: AC
  Filled 2021-10-20: qty 2

## 2021-10-20 MED ORDER — OXYCODONE HCL 5 MG PO TABS
10.0000 mg | ORAL_TABLET | Freq: Four times a day (QID) | ORAL | Status: DC | PRN
  Administered 2021-10-20: 10 mg via ORAL
  Filled 2021-10-20: qty 2

## 2021-10-20 MED ORDER — ONDANSETRON HCL 4 MG PO TABS: 4.0000 mg | ORAL_TABLET | Freq: Three times a day (TID) | ORAL | 0 refills | Status: AC | PRN

## 2021-10-20 MED ORDER — LACTATED RINGERS IV SOLN: INTRAVENOUS | Status: DC

## 2021-10-20 MED ORDER — IOHEXOL 300 MG/ML  SOLN
100.0000 mL | Freq: Once | INTRAMUSCULAR | Status: AC | PRN
  Administered 2021-10-20: 50 mL via INTRA_ARTERIAL

## 2021-10-20 MED ORDER — FENTANYL CITRATE (PF) 100 MCG/2ML IJ SOLN
INTRAMUSCULAR | Status: AC | PRN
  Administered 2021-10-20: 25 ug via INTRAVENOUS

## 2021-10-20 MED ORDER — HYDROMORPHONE HCL 1 MG/ML IJ SOLN
INTRAMUSCULAR | Status: AC | PRN
  Administered 2021-10-20: 1 mg via INTRAVENOUS

## 2021-10-20 MED ORDER — ACETAMINOPHEN 10 MG/ML IV SOLN
1000.0000 mg | Freq: Once | INTRAVENOUS | Status: AC
  Administered 2021-10-20: 1000 mg via INTRAVENOUS
  Filled 2021-10-20 (×2): qty 100

## 2021-10-20 MED ORDER — ONDANSETRON HCL 4 MG/2ML IJ SOLN
4.0000 mg | Freq: Once | INTRAMUSCULAR | Status: AC
  Administered 2021-10-20: 4 mg via INTRAVENOUS
  Filled 2021-10-20: qty 2

## 2021-10-20 MED ORDER — CEFAZOLIN SODIUM-DEXTROSE 2-4 GM/100ML-% IV SOLN
2.0000 g | Freq: Once | INTRAVENOUS | Status: AC
  Administered 2021-10-20: 2 g via INTRAVENOUS
  Filled 2021-10-20: qty 100

## 2021-10-20 MED ORDER — HYDROMORPHONE HCL 1 MG/ML IJ SOLN
INTRAMUSCULAR | Status: AC
  Filled 2021-10-20: qty 1

## 2021-10-20 MED ORDER — OXYCODONE HCL 5 MG PO TABS: 5.0000 mg | ORAL_TABLET | Freq: Four times a day (QID) | ORAL | 0 refills | Status: AC | PRN

## 2021-10-20 NOTE — Discharge Instructions (Addendum)
You may remove your dressing in 24 hours and shower at this time. For problems and concerns, please call Interventional Radiology after hours at [phone].

## 2021-10-20 NOTE — H&P (Signed)
Chief Complaint: Patient was seen in consultation today for hepatocellular carcinoma  Referring Physician(s): Kyra Leyland, MD  Supervising Physician: Michaelle Birks  Patient Status: [facility] - Out-pt  History of Present Illness: Mike Rowe is a 65 y.o. male with a medical history significant for CHF, DM, major depressive disorder, anxiety, hepatitis C, alcoholic cirrhosis and substance abuse. 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 Recent imaging in early October 2022 revealed a 3.3 cm right hepatic lobe mass with an additional sub-centimeter lesion; a core biopsy 08/26/21 was positive for hepatocellular carcinoma.   [name] was referred to [facility] Radiology to discuss possible locoregional treatment options as he is not likely to be a transplant candidate. The patient met with Dr. Maryelizabeth Kaufmann via tele-visit 09/30/21 and a transarterial chemoembolization was proposed as the preferred treatment option. The risks, benefits and alternatives were discussed and there was agreement to proceed.   Past Medical History:  Diagnosis Date   Allergy    Anxiety    Asthma    Cerebral infarction ([place])    CHF (congestive heart failure) ([place])    Diastolic Heart failure per progress note from [facility] and Rehab 02/16/18   Contracture of ankle, left    per progress note from [facility] and Rehab-02/16/2018   Contracture of hip, left    per progress note from [facility] and Rehab 02/16/2018   Contracture of knee joint, right 10/09/2017   Diabetes ([place])    Dysphagia    GERD (gastroesophageal reflux disease)    per progress note from [facility] and Rehab 02/16/2018   Hemiplegia and hemiparesis 03/01/2016   following cerebral infarction affecting left non-dominent side- per progress note from [facility] and Rehab   Hepatitis C virus    Hepatitis C   Hypertension    Major depressive disorder    Stroke ([place]) 06/27/2015    cerebral infarction    Substance abuse [facility])     Past Surgical History:  Procedure Laterality Date   COLONOSCOPY WITH PROPOFOL N/A 12/13/2016   Procedure: COLONOSCOPY WITH PROPOFOL;  Surgeon: Jerene Bears, MD;  [facility];  Service: Gastroenterology;  Laterality: N/A;   COLONOSCOPY WITH PROPOFOL N/A 04/03/2018   Procedure: COLONOSCOPY WITH PROPOFOL;  Surgeon: Jerene Bears, MD;  [facility];  Service: Gastroenterology;  Laterality: N/A;   ESOPHAGOGASTRODUODENOSCOPY (EGD) WITH PROPOFOL N/A 12/13/2016   Procedure: ESOPHAGOGASTRODUODENOSCOPY (EGD) WITH PROPOFOL;  Surgeon: Jerene Bears, MD;  [facility];  Service: Gastroenterology;  Laterality: N/A;   IR RADIOLOGIST EVAL & MGMT  09/30/2021   LAPAROTOMY N/A 06/28/2015   Procedure: EXPLORATORY LAPAROTOMY;  Surgeon: Georganna Skeans, MD;  Location: [place];  Service: General;  Laterality: N/A;   PEG PLACEMENT N/A 06/25/2015   Procedure: PERCUTANEOUS ENDOSCOPIC GASTROSTOMY (PEG) PLACEMENT;  Surgeon: Georganna Skeans, MD;  Location: [name];  Service: General;  Laterality: N/A;   PEG PLACEMENT N/A 06/28/2015   Procedure: PERCUTANEOUS ENDOSCOPIC GASTROSTOMY (PEG) PLACEMENT;  Surgeon: Georganna Skeans, MD;  Location: [place];  Service: General;  Laterality: N/A;   POLYPECTOMY  04/03/2018   Procedure: POLYPECTOMY;  Surgeon: Jerene Bears, MD;  [facility];  Service: Gastroenterology;;    Allergies: Patient has no known allergies.  Medications: Prior to Admission medications   Medication Sig Start Date End Date Taking? Authorizing Provider  acetaminophen (TYLENOL) 325 MG tablet Take 650 mg by mouth every 6 (six) hours as needed for mild pain or moderate pain.  [provider]  albuterol (PROVENTIL) (2.5 MG/3ML) 0.083% nebulizer solution Take by nebulization. 01/18/21   [provider]  amLODipine (NORVASC) 10 MG tablet Take 10 mg by mouth daily.    [provider]  atorvastatin (LIPITOR)  10 MG tablet Take 10 mg by mouth at bedtime.    [provider]  budesonide-formoterol (SYMBICORT) 160-4.5 MCG/ACT inhaler Inhale 2 puffs into the lungs 2 (two) times daily.    [provider]  Cholecalciferol (VITAMIN D3) 1.25 MG (50000 UT) TABS Take 1 tablet by mouth See admin instructions. Give 1 tablet by mouth on the 22nd of every month for supplement.    [provider]  citalopram (CELEXA) 40 MG tablet Take 40 mg by mouth daily.    [provider]  diazepam (VALIUM) 2 MG tablet Take 1 tablet (2 mg total) by mouth 2 (two) times daily. Patient taking differently: Take 2 mg by mouth every 8 (eight) hours as needed. 09/17/19   [name], MD  docusate sodium (COLACE) 100 MG capsule Take 200 mg by mouth daily.     [provider]  fenofibrate (TRICOR) 48 MG tablet Take 48 mg by mouth daily.    [provider]  fluticasone (FLONASE) 50 MCG/ACT nasal spray Place 1 spray into both nostrils See admin instructions. Use 1 spray in each nostril daily. Use 1 spray in each nostril daily as needed for rhinitis    [provider]  furosemide (LASIX) 20 MG tablet Take 20 mg by mouth daily.    [provider]  labetalol (NORMODYNE) 300 MG tablet Take 1 tablet (300 mg total) by mouth 3 (three) times daily. 06/25/15   Garvin Fila, MD  lisinopril (PRINIVIL,ZESTRIL) 10 MG tablet Take 10 mg by mouth daily.    [provider]  oxyCODONE (OXY IR/ROXICODONE) 5 MG immediate release tablet Take by mouth. 02/09/21   [provider]  polyethylene glycol (MIRALAX / GLYCOLAX) packet Take 17 g by mouth See admin instructions. Mix 17 g and drink once daily. Mix 17 g and drink once daily as needed for constipation    [provider]  senna (SENOKOT) 8.6 MG tablet Take 1 tablet by mouth daily.    [provider]  tamsulosin (FLOMAX) 0.4 MG CAPS capsule Take 0.4 mg by mouth daily.    [provider]  traZODone  (DESYREL) 50 MG tablet Take 50 mg by mouth at bedtime.    [provider]     Family History  Problem Relation Age of Onset   Colon cancer Neg Hx    Stomach cancer Neg Hx    Rectal cancer Neg Hx    Liver cancer Neg Hx    Esophageal cancer Neg Hx     Social History   Socioeconomic History   Marital status: Single    Spouse name: Not on file   Number of children: 2   Years of education: Not on file   Highest education level: Not on file  Occupational History   Occupation: disabled  Tobacco Use   Smoking status: Former   Smokeless tobacco: Never  Scientific laboratory technician Use: Never used  Substance and Sexual Activity   Alcohol use: Not Currently   Drug use: Yes    Types: Cocaine    Comment: Unable to assess as patient is non verbal at this time.   Sexual activity: Not on file    Comment: Patient non verbal at this time.  Other Topics Concern   Not on file  Social History Narrative   Not on file   Social Determinants of Health   Financial Resource Strain: Not on file  Food Insecurity: Not on file  Transportation Needs: Not on file  Physical Activity: Not on file  Stress: Not on file  Social Connections: Not on file    Review of Systems: A 12 point ROS discussed and pertinent positives are indicated in the HPI above.  All other systems are negative.  Review of Systems  Constitutional:  Negative for appetite change and fatigue.  Respiratory:  Negative for cough and shortness of breath.   Cardiovascular:  Negative for chest pain and leg swelling.  Gastrointestinal:  Negative for abdominal pain, diarrhea, nausea and vomiting.  Musculoskeletal:  Positive for arthralgias and myalgias.       Left arm/leg contractures  Neurological:  Positive for weakness. Negative for headaches.       Left-side paralysis   Vital Signs: BP 111/68   Pulse (!) 59   Temp 97.8 F (36.6 C) (Oral)   Resp 15   SpO2 97%   Physical Exam Constitutional:      General: He is not in  acute distress.    Appearance: He is ill-appearing.  HENT:     Mouth/Throat:     Mouth: Mucous membranes are moist.     Pharynx: Oropharynx is clear.  Cardiovascular:     Rate and Rhythm: Normal rate and regular rhythm.     Pulses: Normal pulses.     Heart sounds: Normal heart sounds.  Pulmonary:     Effort: Pulmonary effort is normal.     Breath sounds: Normal breath sounds.  Abdominal:     General: Bowel sounds are normal.     Palpations: Abdomen is soft.  Musculoskeletal:        General: Deformity present.     Comments: Left arm/leg contractures  Skin:    General: Skin is warm and dry.  Neurological:     Mental Status: He is alert and oriented to person, place, and time.  Psychiatric:        Mood and Affect: Affect is angry.    Imaging: IR Radiologist Eval & Mgmt  Result Date: 09/30/2021 Please refer to notes tab for details about interventional procedure. (Op Note)   Labs:  CBC: Recent Labs    10/20/21 1035  WBC 8.9  HGB 15.4  HCT 46.1  PLT 151    COAGS: No results for input(s): INR, APTT in the last 8760 hours.  BMP: Recent Labs    10/20/21 1035  NA 136  K 4.1  CL 103  CO2 22  GLUCOSE 142*  BUN 24*  CALCIUM 9.2  CREATININE 1.02  GFRNONAA >60    LIVER FUNCTION TESTS: No results for input(s): BILITOT, AST, ALT, ALKPHOS, PROT, ALBUMIN in the last 8760 hours.  TUMOR MARKERS: No results for input(s): AFPTM, CEA, CA199, CHROMGRNA in the last 8760 hours.  Assessment and Plan:  Hepatocellular carcinoma: Reade Trefz, 65 year old male, presents today to the [place] Radiology department for an image-guided transarterial chemoembolization with drug-eluting beads (DEB-TACE). The procedure will be done via right trans-femoral access secondary to patient contractures. This will be done under moderate sedation and same-day discharge.   Risks and benefits of this procedure were discussed with the patient including, but not limited to,  bleeding, infection, vascular injury, contrast induced renal failure, post procedural pain, nausea, vomiting, fatigue, progression of liver failure,  chemical cholecystitis, or need for additional procedures.  This interventional procedure involves the use of X-rays and because of the nature of the planned procedure, it is possible that we will have prolonged use of X-ray fluoroscopy.  Potential radiation risks to you include (but are not limited to) the following: - A slightly elevated risk for cancer  several years later in life. This risk is typically less than 0.5% percent. This risk is low in comparison to the normal incidence of human cancer, which is 33% for women and 50% for men according to the [place] [employer] [place]. - Radiation induced injury can include skin redness, resembling a rash, tissue breakdown / ulcers and hair loss (which can be temporary or permanent).   The likelihood of either of these occurring depends on the difficulty of the procedure and whether you are sensitive to radiation due to previous procedures, disease, or genetic conditions.   IF your procedure requires a prolonged use of radiation, you will be notified and given written instructions for further action.  It is your responsibility to monitor the irradiated area for the 2 weeks following the procedure and to notify your physician if you are concerned that you have suffered a radiation induced injury.    All of the patient's questions were answered, patient is agreeable to proceed. He has been NPO.   Consent signed and in chart.   Thank you for this interesting consult.  I greatly enjoyed meeting Andrej Spagnoli and look forward to participating in their care.  A copy of this report was sent to the requesting provider on this date.  Electronically Signed: Soyla Dryer, AGACNP-BC [phone] 10/20/2021, 11:10 AM   I spent a total of  30 Minutes   in face to face in clinical consultation, greater than 50% of  which was counseling/coordinating care for hepatocellular carcinoma treatment.

## 2021-10-20 NOTE — Procedures (Signed)
Vascular and Interventional Radiology Procedure Note  Patient: Mike Rowe DOB: 1956/01/18 Medical Record Number: [id number] Note Date/Time: 10/20/21 2:39 PM   Performing Physician: Michaelle Birks, MD Assistant(s): Gunnar Fusi, RT IR  Diagnosis: [place]  Procedure:  CELIAC ARTERIOGRAPHY TRANSARTERIAL CHEMOEMBOLIZATION (DEB-TACE) of LIVER TUMORS  Anesthesia: Conscious Sedation Complications: None Estimated Blood Loss: Minimal Specimens: None  Findings:  - access via the right femoral artery. - Dominant and satellite [place] tumors arising from [place]. - Successfully treated with DEB-TACE - Angio-Seal closure at R groin. - Distal pulses present at the end of the Case  Final report to follow once all images are reviewed and compared with previous studies.  See detailed dictation with images in PACS. The patient tolerated the procedure well without incident or complication and was returned to Recovery in stable condition.    Michaelle Birks, MD Vascular and Interventional Radiology Specialists [facility] Radiology   Pager. [place]

## 2021-10-21 LAB — AFP TUMOR MARKER: AFP, Serum, Tumor Marker: 2.6 ng/mL (ref 0.0–8.4)

## 2021-11-09 ENCOUNTER — Other Ambulatory Visit: Payer: Self-pay | Admitting: Interventional Radiology

## 2021-11-09 DIAGNOSIS — C220 Liver cell carcinoma: Secondary | ICD-10-CM

## 2021-11-17 ENCOUNTER — Ambulatory Visit
Admission: RE | Admit: 2021-11-17 | Discharge: 2021-11-17 | Disposition: A | Discharge status: Home / Self Care | Payer: Medicare Other | Source: Ambulatory Visit | Attending: Interventional Radiology | Admitting: Interventional Radiology

## 2021-11-17 ENCOUNTER — Other Ambulatory Visit: Payer: Self-pay

## 2021-11-17 ENCOUNTER — Encounter: Payer: Self-pay | Admitting: *Deleted

## 2021-11-17 DIAGNOSIS — C220 Liver cell carcinoma: Secondary | ICD-10-CM

## 2021-11-17 HISTORY — PX: IR RADIOLOGIST EVAL & MGMT: IMG5224

## 2021-11-17 NOTE — Progress Notes (Signed)
Vascular and Interventional Radiology  Brief Phone Note  Patient: Mike Rowe DOB: 20-Apr-1956 Medical Record Number: [id number] Note Date/Time: 11/17/21 1:57 PM   Diagnosis: Hepatocellular carcinoma ([place]) [C22.0]  1. Hepatocellular carcinoma [facility])      Assessment   Plan:  66 y.o. 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 Newly Dx [place] s/p DEB TACE on 10/20/21.  The patient is a resident at [place], and follow up arranged virtually due to patient's transportation challenges.  Unfortunately the Rehab facility did not have any cordless phones available to reach the Pt's room, thus the virtual visit could not be performed.  Facility RN obtained [employer] phone number and will provide to their Social Worker, to arrange for appropriate equipment to allow virtual visit.  - until then, follow up PRN   Follow up No follow-ups on file.  As part of this Telephone encounter, no in-person exam was conducted.   The patient was physically located in [place] or a state in which I am permitted to provide care. The patient and/or parent/guardian has been advised of the potential risks and limitations of this mode of treatment (including, but not limited to, the absence of in-person examination) and has agreed to be treated using telemedicine.    Michaelle Birks, MD Vascular and Interventional Radiology Specialists [facility] Radiology   Pager. [place]

## 2022-01-31 ENCOUNTER — Other Ambulatory Visit: Payer: Self-pay

## 2022-01-31 ENCOUNTER — Other Ambulatory Visit: Payer: Self-pay | Admitting: Interventional Radiology

## 2022-01-31 DIAGNOSIS — C220 Liver cell carcinoma: Secondary | ICD-10-CM

## 2022-02-14 ENCOUNTER — Other Ambulatory Visit: Payer: Self-pay

## 2022-02-14 DIAGNOSIS — C220 Liver cell carcinoma: Secondary | ICD-10-CM

## 2022-03-16 ENCOUNTER — Encounter: Payer: Self-pay | Admitting: *Deleted

## 2022-03-16 ENCOUNTER — Ambulatory Visit
Admission: RE | Admit: 2022-03-16 | Discharge: 2022-03-16 | Disposition: A | Discharge status: Home / Self Care | Payer: Medicare Other | Source: Ambulatory Visit | Attending: Interventional Radiology | Admitting: Interventional Radiology

## 2022-03-16 DIAGNOSIS — C220 Liver cell carcinoma: Secondary | ICD-10-CM

## 2022-03-16 HISTORY — PX: IR RADIOLOGIST EVAL & MGMT: IMG5224

## 2022-03-16 NOTE — Progress Notes (Signed)
? ?Referring Physician(s): ?Misenheimer,Timothy ? ?Chief Complaint: ?Diagnosis: Hepatocellular carcinoma ([place]) [C22.0]  ? ?History of present illness: ? ?66 y.o. 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 Pt was diagnosed with [place] in 08/2021 s/p DEB TACE on 10/20/21. ?  ?The patient is a resident at [place], and follow up arranged virtually due to patient's transportation challenges. ? ?VIR MD reached out but unfortunately the Rehab facility did not have any cordless phones available to reach the Pt's room, and Pt's RN also reported being in a the middle of an emergency with one of her other Pts, thus the virtual visit could not be performed. ? ? ?Past Medical History:  ?Diagnosis Date  ? Allergy   ? Anxiety   ? Asthma   ? Cerebral infarction [facility])   ? CHF (congestive heart failure) ([place])   ? Diastolic Heart failure per progress note from [facility] and Rehab 02/16/18  ? Contracture of ankle, left   ? per progress note from [facility] and Rehab-02/16/2018  ? Contracture of hip, left   ? per progress note from [facility] and Rehab 02/16/2018  ? Contracture of knee joint, right 10/09/2017  ? Diabetes ([place])   ? Dysphagia   ? GERD (gastroesophageal reflux disease)   ? per progress note from [facility] and Rehab 02/16/2018  ? Hemiplegia and hemiparesis 03/01/2016  ? following cerebral infarction affecting left non-dominent side- per progress note from [facility]  ? Hepatitis C virus   ? Hepatitis C  ? Hypertension   ? Major depressive disorder   ? Stroke ([place]) 06/27/2015  ? cerebral infarction   ? Substance abuse ([place])   ? ? ?Past Surgical History:  ?Procedure Laterality Date  ? COLONOSCOPY WITH PROPOFOL N/A 12/13/2016  ? Procedure: COLONOSCOPY WITH PROPOFOL;  Surgeon: Jerene Bears, MD;  [facility]: [facility];  Service: Gastroenterology;  Laterality: N/A;  ?  COLONOSCOPY WITH PROPOFOL N/A 04/03/2018  ? Procedure: COLONOSCOPY WITH PROPOFOL;  Surgeon: Jerene Bears, MD;  Location: [facility];  Service: Gastroenterology;  Laterality: N/A;  ? ESOPHAGOGASTRODUODENOSCOPY (EGD) WITH PROPOFOL N/A 12/13/2016  ? Procedure: ESOPHAGOGASTRODUODENOSCOPY (EGD) WITH PROPOFOL;  Surgeon: Jerene Bears, MD;  [facility];  Service: Gastroenterology;  Laterality: N/A;  ? IR ANGIOGRAM SELECTIVE EACH ADDITIONAL VESSEL  10/20/2021  ? IR ANGIOGRAM SELECTIVE EACH ADDITIONAL VESSEL  10/20/2021  ? IR ANGIOGRAM VISCERAL SELECTIVE  10/20/2021  ? IR EMBO TUMOR ORGAN ISCHEMIA INFARCT INC GUIDE ROADMAPPING  10/20/2021  ? IR RADIOLOGIST EVAL & MGMT  09/30/2021  ? IR RADIOLOGIST EVAL & MGMT  11/17/2021  ? IR US GUIDE VASC ACCESS RIGHT  10/20/2021  ? LAPAROTOMY N/A 06/28/2015  ? Procedure: EXPLORATORY LAPAROTOMY;  Surgeon: Georganna Skeans, MD;  Location: [place];  Service: General;  Laterality: N/A;  ? PEG PLACEMENT N/A 06/25/2015  ? Procedure: PERCUTANEOUS ENDOSCOPIC GASTROSTOMY (PEG) PLACEMENT;  Surgeon: Georganna Skeans, MD;  Location: [place];  Service: General;  Laterality: N/A;  ? PEG PLACEMENT N/A 06/28/2015  ? Procedure: PERCUTANEOUS ENDOSCOPIC GASTROSTOMY (PEG) PLACEMENT;  Surgeon: Georganna Skeans, MD;  Location: [place];  Service: General;  Laterality: N/A;  ? POLYPECTOMY  04/03/2018  ? Procedure: POLYPECTOMY;  Surgeon: Jerene Bears, MD;  Location: [facility];  Service: Gastroenterology;;  ? ? ?Allergies: ?Patient has no known allergies. ? ?Medications: ?Prior to Admission medications   ?Medication Sig Start Date End  Date Taking? Authorizing Provider  ?acetaminophen (TYLENOL) 325 MG tablet Take 650 mg by mouth every 6 (six) hours as needed for mild pain or moderate pain.    [provider]  ?albuterol (PROVENTIL) (2.5 MG/3ML) 0.083% nebulizer solution Take by nebulization. 01/18/21   [provider]  ?amLODipine (NORVASC) 10 MG tablet Take 10 mg by mouth daily.    [provider]  ?atorvastatin (LIPITOR) 10 MG tablet Take 10 mg by mouth at bedtime.    [provider]  ?budesonide-formoterol (SYMBICORT) 160-4.5 MCG/ACT inhaler Inhale 2 puffs into the lungs 2 (two) times daily.    [provider]  ?Cholecalciferol (VITAMIN D3) 1.25 MG (50000 UT) TABS Take 1 tablet by mouth See admin instructions. Give 1 tablet by mouth on the 22nd of every month for supplement.    [provider]  ?citalopram (CELEXA) 40 MG tablet Take 40 mg by mouth daily.    [provider]  ?diazepam (VALIUM) 2 MG tablet Take 1 tablet (2 mg total) by mouth 2 (two) times daily. ?Patient taking differently: Take 2 mg by mouth every 8 (eight) hours as needed. 09/17/19   [name], MD  ?docusate sodium (COLACE) 100 MG capsule Take 200 mg by mouth daily.     [provider]  ?fenofibrate (TRICOR) 48 MG tablet Take 48 mg by mouth daily.    [provider]  ?fluticasone (FLONASE) 50 MCG/ACT nasal spray Place 1 spray into both nostrils See admin instructions. Use 1 spray in each nostril daily. Use 1 spray in each nostril daily as needed for rhinitis    [provider]  ?furosemide (LASIX) 20 MG tablet Take 20 mg by mouth daily.    [provider]  ?labetalol (NORMODYNE) 300 MG tablet Take 1 tablet (300 mg total) by mouth 3 (three) times daily. 06/25/15   Garvin Fila, MD  ?lisinopril (PRINIVIL,ZESTRIL) 10 MG tablet Take 10 mg by mouth daily.    [provider]  ?oxyCODONE (OXY IR/ROXICODONE) 5 MG immediate release tablet Take by mouth. 02/09/21   [provider]  ?oxyCODONE (ROXICODONE) 5 MG immediate release tablet Take 1 tablet (5 mg total) by mouth every 6 (six) hours as needed for up to 20 doses for severe pain or moderate pain. 10/20/21   Theresa Duty, NP  ?polyethylene glycol (MIRALAX / GLYCOLAX) packet Take 17 g by mouth See admin instructions. Mix 17 g and drink once daily. Mix 17 g and drink once daily as  needed for constipation    [provider]  ?propranolol (INDERAL) 10 MG tablet Take 10 mg by mouth 3 (three) times daily.    [provider]  ?senna (SENOKOT) 8.6 MG tablet Take 1 tablet by mouth daily.    [provider]  ?tamsulosin (FLOMAX) 0.4 MG CAPS capsule Take 0.4 mg by mouth daily.    [provider]  ?traZODone (DESYREL) 50 MG tablet Take 50 mg by mouth at bedtime.    [provider]  ?  ? ?Family History  ?Problem Relation Age of Onset  ? Colon cancer Neg Hx   ? Stomach cancer Neg Hx   ? Rectal cancer Neg Hx   ? Liver cancer Neg Hx   ? Esophageal cancer Neg Hx   ? ? ?Social History  ? ?Socioeconomic History  ? Marital status: Single  ?  Spouse name: Not on file  ? Number of children: 2  ? Years of education: Not on file  ?  Highest education level: Not on file  ?Occupational History  ? Occupation: disabled  ?Tobacco Use  ? Smoking status: Former  ? Smokeless tobacco: Never  ?Vaping Use  ? Vaping Use: Never used  ?Substance and Sexual Activity  ? Alcohol use: Not Currently  ? Drug use: Yes  ?  Types: Cocaine  ?  Comment: Unable to assess as patient is non verbal at this time.  ? Sexual activity: Not on file  ?  Comment: Patient non verbal at this time.  ?Other Topics Concern  ? Not on file  ?Social History Narrative  ? Not on file  ? ?Social Determinants of Health  ? ?Financial Resource Strain: Not on file  ?Food Insecurity: Not on file  ?Transportation Needs: Not on file  ?Physical Activity: Not on file  ?Stress: Not on file  ?Social Connections: Not on file  ? ? ?Vital Signs: ?There were no vitals taken for this visit. ? ?Physical Exam ?Deferred  ? ?Imaging: ? ?CT AP w/ multiphase, 03/10/22 ?Imaging independently reviewed, demonstrating no APHE at dominant seg VII lesion characteristic of viable tumor. Additional, smaller / peripheral seg VII lesion without evidence of viable tumor. ? ? ? ?Labs: ? ?CBC: ?Recent Labs  ?  10/20/21 ?1035  ?WBC 8.9  ?HGB 15.4  ?HCT  46.1  ?PLT 151  ? ? ?COAGS: ?Recent Labs  ?  10/20/21 ?1035  ?INR 1.0  ? ? ?BMP: ?Recent Labs  ?  10/20/21 ?1035  ?NA 137  136  ?K 4.1  4.1  ?CL 104  103  ?CO2 22  22  ?GLUCOSE 135*  142*  ?BUN 26*  24*  ?CALCIUM 9.2  9.2

## 2022-05-24 ENCOUNTER — Other Ambulatory Visit: Payer: Self-pay | Admitting: Interventional Radiology

## 2022-05-24 DIAGNOSIS — C220 Liver cell carcinoma: Secondary | ICD-10-CM

## 2022-06-24 ENCOUNTER — Other Ambulatory Visit: Payer: Self-pay | Admitting: Interventional Radiology

## 2022-07-04 ENCOUNTER — Ambulatory Visit
Admission: RE | Admit: 2022-07-04 | Discharge: 2022-07-04 | Disposition: A | Discharge status: Home / Self Care | Payer: Medicare Other | Source: Ambulatory Visit | Attending: Interventional Radiology | Admitting: Interventional Radiology

## 2022-07-04 DIAGNOSIS — C220 Liver cell carcinoma: Secondary | ICD-10-CM

## 2022-07-04 HISTORY — PX: IR RADIOLOGIST EVAL & MGMT: IMG5224

## 2022-07-04 NOTE — Progress Notes (Signed)
Reason for visit: Follow up [place] Team(s): Primary Care: Rica Koyanagi, MD Gastroenterologist: Kyra Leyland MD  Virtual Visit via Telephone Note   I connected with [name] at 2:00 PM EST by telephone and verified that I am speaking with the correct person using two identifiers.   I discussed the limitations, risks, security and privacy concerns of performing an evaluation and management service by telephone and the availability of in-person appointments.  They are joined in the visit by his facility nurse, Mike Rowe.  History of present illness:  66 y.o. 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 Pt was diagnosed with [place] in 08/2021 s/p DEB TACE on 10/20/21. He had initial follow up imaging with multiphase CT in 02/2022 and most recently on 06/30/2022.   The patient is a resident at [facility], and follow up arranged virtually due to patient's transportation challenges. He reports to be in his usual state of health, without acute complaint.   Review of Systems: A 12-point ROS discussed, and pertinent positives are indicated in the HPI above.  All other systems are negative.   Past Medical History:  Diagnosis Date   Allergy    Anxiety    Asthma    Cerebral infarction ([place])    CHF (congestive heart failure) ([place])    Diastolic Heart failure per progress note from [facility] and Rehab 02/16/18   Contracture of ankle, left    per progress note from [facility] and Rehab-02/16/2018   Contracture of hip, left    per progress note from [facility] 02/16/2018   Contracture of knee joint, right 10/09/2017   Diabetes ([place])    Dysphagia    GERD (gastroesophageal reflux disease)    per progress note from [facility] and [facility] 02/16/2018   Hemiplegia and hemiparesis 03/01/2016   following cerebral infarction affecting left  non-dominent side- per progress note from [facility]   Hepatitis C virus    Hepatitis C   Hypertension    Major depressive disorder    Stroke ([place]) 06/27/2015   cerebral infarction    Substance abuse [facility])     Past Surgical History:  Procedure Laterality Date   COLONOSCOPY WITH PROPOFOL N/A 12/13/2016   Procedure: COLONOSCOPY WITH PROPOFOL;  Surgeon: Mike Bears, MD;  [facility];  Service: Gastroenterology;  Laterality: N/A;   COLONOSCOPY WITH PROPOFOL N/A 04/03/2018   Procedure: COLONOSCOPY WITH PROPOFOL;  Surgeon: Mike Bears, MD;  [facility];  Service: Gastroenterology;  Laterality: N/A;   ESOPHAGOGASTRODUODENOSCOPY (EGD) WITH PROPOFOL N/A 12/13/2016   Procedure: ESOPHAGOGASTRODUODENOSCOPY (EGD) WITH PROPOFOL;  Surgeon: Mike Bears, MD;  [facility];  Service: Gastroenterology;  Laterality: N/A;   IR ANGIOGRAM SELECTIVE EACH ADDITIONAL VESSEL  10/20/2021   IR ANGIOGRAM SELECTIVE EACH ADDITIONAL VESSEL  10/20/2021   IR ANGIOGRAM VISCERAL SELECTIVE  10/20/2021   IR EMBO TUMOR ORGAN ISCHEMIA INFARCT INC GUIDE ROADMAPPING  10/20/2021   IR RADIOLOGIST EVAL & MGMT  09/30/2021   IR RADIOLOGIST EVAL & MGMT  11/17/2021   IR RADIOLOGIST EVAL & MGMT  03/16/2022   IR US GUIDE VASC ACCESS RIGHT  10/20/2021   LAPAROTOMY N/A 06/28/2015   Procedure: EXPLORATORY LAPAROTOMY;  Surgeon: Georganna Skeans, MD;  Location: [place];  Service: General;  Laterality: N/A;   PEG PLACEMENT N/A 06/25/2015   Procedure: PERCUTANEOUS ENDOSCOPIC  GASTROSTOMY (PEG) PLACEMENT;  Surgeon: Georganna Skeans, MD;  Location: [place];  Service: General;  Laterality: N/A;   PEG PLACEMENT N/A 06/28/2015   Procedure: PERCUTANEOUS ENDOSCOPIC GASTROSTOMY (PEG) PLACEMENT;  Surgeon: Georganna Skeans, MD;  Location: [place];  Service: General;  Laterality: N/A;   POLYPECTOMY  04/03/2018   Procedure: POLYPECTOMY;  Surgeon: Mike Bears, MD;  [facility];  Service: Gastroenterology;;     Allergies: Patient has no known allergies.  Medications: Prior to Admission medications   Medication Sig Start Date End Date Taking? Authorizing Provider  acetaminophen (TYLENOL) 325 MG tablet Take 650 mg by mouth every 6 (six) hours as needed for mild pain or moderate pain.    [provider]  albuterol (PROVENTIL) (2.5 MG/3ML) 0.083% nebulizer solution Take by nebulization. 01/18/21   [provider]  amLODipine (NORVASC) 10 MG tablet Take 10 mg by mouth daily.    [provider]  atorvastatin (LIPITOR) 10 MG tablet Take 10 mg by mouth at bedtime.    [provider]  budesonide-formoterol (SYMBICORT) 160-4.5 MCG/ACT inhaler Inhale 2 puffs into the lungs 2 (two) times daily.    [provider]  Cholecalciferol (VITAMIN D3) 1.25 MG (50000 UT) TABS Take 1 tablet by mouth See admin instructions. Give 1 tablet by mouth on the 22nd of every month for supplement.    [provider]  citalopram (CELEXA) 40 MG tablet Take 40 mg by mouth daily.    [provider]  diazepam (VALIUM) 2 MG tablet Take 1 tablet (2 mg total) by mouth 2 (two) times daily. Patient taking differently: Take 2 mg by mouth every 8 (eight) hours as needed. 09/17/19   [name], MD  docusate sodium (COLACE) 100 MG capsule Take 200 mg by mouth daily.     [provider]  fenofibrate (TRICOR) 48 MG tablet Take 48 mg by mouth daily.    [provider]  fluticasone (FLONASE) 50 MCG/ACT nasal spray Place 1 spray into both nostrils See admin instructions. Use 1 spray in each nostril daily. Use 1 spray in each nostril daily as needed for rhinitis    [provider]  furosemide (LASIX) 20 MG tablet Take 20 mg by mouth daily.    [provider]  labetalol (NORMODYNE) 300 MG tablet Take 1 tablet (300 mg total) by mouth 3 (three) times daily. 06/25/15   Garvin Fila, MD  lisinopril (PRINIVIL,ZESTRIL) 10 MG tablet Take 10 mg by mouth  daily.    [provider]  oxyCODONE (OXY IR/ROXICODONE) 5 MG immediate release tablet Take by mouth. 02/09/21   [provider]  oxyCODONE (ROXICODONE) 5 MG immediate release tablet Take 1 tablet (5 mg total) by mouth every 6 (six) hours as needed for up to 20 doses for severe pain or moderate pain. 10/20/21   Theresa Duty, NP  polyethylene glycol (MIRALAX / GLYCOLAX) packet Take 17 g by mouth See admin instructions. Mix 17 g and drink once daily. Mix 17 g and drink once daily as needed for constipation    [provider]  propranolol (INDERAL) 10 MG tablet Take 10 mg by mouth 3 (three) times daily.    [provider]  senna (SENOKOT) 8.6 MG tablet Take 1 tablet by mouth daily.    [provider]  tamsulosin (FLOMAX) 0.4 MG CAPS capsule Take 0.4 mg by mouth daily.    [provider]  traZODone (DESYREL) 50 MG tablet Take 50 mg by mouth  at bedtime.    [provider]     Family History  Problem Relation Age of Onset   Colon cancer Neg Hx    Stomach cancer Neg Hx    Rectal cancer Neg Hx    Liver cancer Neg Hx    Esophageal cancer Neg Hx     Social History   Socioeconomic History   Marital status: Single    Spouse name: Not on file   Number of children: 2   Years of education: Not on file   Highest education level: Not on file  Occupational History   Occupation: disabled  Tobacco Use   Smoking status: Former   Smokeless tobacco: Never  Scientific laboratory technician Use: Never used  Substance and Sexual Activity   Alcohol use: Not Currently   Drug use: Yes    Types: Cocaine    Comment: Unable to assess as patient is non verbal at this time.   Sexual activity: Not on file    Comment: Patient non verbal at this time.  Other Topics Concern   Not on file  Social History Narrative   Not on file   Social Determinants of Health   Financial Resource Strain: Not on file  Food Insecurity: Not on file  Transportation Needs:  Not on file  Physical Activity: Not on file  Stress: Not on file  Social Connections: Not on file    Vital Signs: There were no vitals taken for this visit.  Physical Exam Deferred secondary to virtual visit.   Imaging:  Multiphase CT AP, 06/30/22 no APHE at dominant seg VII lesion and additional, smaller / peripheral seg VII lesion without evidence of viable tumor.    Labs:  CBC: Recent Labs    10/20/21 1035  WBC 8.9  HGB 15.4  HCT 46.1  PLT 151    COAGS: Recent Labs    10/20/21 1035  INR 1.0    BMP: Recent Labs    10/20/21 1035  NA 137  136  K 4.1  4.1  CL 104  103  CO2 22  22  GLUCOSE 135*  142*  BUN 26*  24*  CALCIUM 9.2  9.2  CREATININE 1.08  1.02  GFRNONAA >60  >60    LIVER FUNCTION TESTS: Recent Labs    10/20/21 1035  BILITOT 0.9  AST 43*  ALT 54*  ALKPHOS 49  PROT 7.9  ALBUMIN 4.6    Assessment and Plan:  66 y.o. 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 Pt was diagnosed with [place] in 08/2021 s/p DEB TACE on 10/20/21.   Follow up imaging w multiphasic CT AP on 03/10/22 and 06/30/22 LR TR equivocal with no viable tumor  AFP 3.6 (03/10/22), 2.6 (10/20/21)   *follow up CT AP multiphase, LFTs and AFP in 6 months *until then, follow up PRN  Thank you for allowing [place] to participate in the care of this Patient. Please contact me with questions, concerns, or if new issues arise.  Electronically Signed:  Michaelle Birks, MD Vascular and Interventional Radiology Specialists [facility] Radiology   Pager. [phone] Clinic. [phone]  I spent a total of 30 Minutes of non-face-to-face time in clinical consultation, greater than 50% of which was counseling/coordinating care for [name] evaluation for [facility] treatment and follow up.

## 2022-11-29 ENCOUNTER — Other Ambulatory Visit: Payer: Self-pay | Admitting: Interventional Radiology

## 2022-11-29 DIAGNOSIS — C220 Liver cell carcinoma: Secondary | ICD-10-CM

## 2022-12-12 NOTE — Progress Notes (Deleted)
Cardiology Office Note:    Date:  12/12/2022   ID:  Mike Rowe, DOB 06-11-1956, MRN [id number]  PCP:  Rica Koyanagi, [place] Providers Cardiologist:  None { Click to update primary MD,subspecialty MD or APP then REFRESH:1}    Referring MD: Nelle Don, PA-C   No chief complaint on file. ***  History of Present Illness:    Mike Rowe is a 67 y.o. male with a hx of ***  Past Medical History:  Diagnosis Date   Allergy    Anxiety    Asthma    Cerebral infarction ([place])    CHF (congestive heart failure) (HCC)    Diastolic Heart failure per progress note from [facility] and Rehab 02/16/18   Contracture of ankle, left    per progress note from [facility] Rehab-02/16/2018   Contracture of hip, left    per progress note from [facility] and Rehab 02/16/2018   Contracture of knee joint, right 10/09/2017   Diabetes ([place])    Dysphagia    GERD (gastroesophageal reflux disease)    per progress note from [facility] and Rehab 02/16/2018   Hemiplegia and hemiparesis 03/01/2016   following cerebral infarction affecting left non-dominent side- per progress note from [facility] and Rehab   Hepatitis C virus    Hepatitis C   Hypertension    Major depressive disorder    Stroke ([place]) 06/27/2015   cerebral infarction    Substance abuse [facility])     Past Surgical History:  Procedure Laterality Date   COLONOSCOPY WITH PROPOFOL N/A 12/13/2016   Procedure: COLONOSCOPY WITH PROPOFOL;  Surgeon: Jerene Bears, MD;  [facility]: [facility];  Service: Gastroenterology;  Laterality: N/A;   COLONOSCOPY WITH PROPOFOL N/A 04/03/2018   Procedure: COLONOSCOPY WITH PROPOFOL;  Surgeon: Jerene Bears, MD;  [facility];  Service: Gastroenterology;  Laterality: N/A;   ESOPHAGOGASTRODUODENOSCOPY (EGD) WITH PROPOFOL N/A 12/13/2016   Procedure: ESOPHAGOGASTRODUODENOSCOPY (EGD) WITH PROPOFOL;  Surgeon: Jerene Bears, MD;  [facility];  Service:  Gastroenterology;  Laterality: N/A;   IR ANGIOGRAM SELECTIVE EACH ADDITIONAL VESSEL  10/20/2021   IR ANGIOGRAM SELECTIVE EACH ADDITIONAL VESSEL  10/20/2021   IR ANGIOGRAM VISCERAL SELECTIVE  10/20/2021   IR EMBO TUMOR ORGAN ISCHEMIA INFARCT INC GUIDE ROADMAPPING  10/20/2021   IR RADIOLOGIST EVAL & MGMT  09/30/2021   IR RADIOLOGIST EVAL & MGMT  11/17/2021   IR RADIOLOGIST EVAL & MGMT  03/16/2022   IR RADIOLOGIST EVAL & MGMT  07/04/2022   IR US GUIDE VASC ACCESS RIGHT  10/20/2021   LAPAROTOMY N/A 06/28/2015   Procedure: EXPLORATORY LAPAROTOMY;  Surgeon: Georganna Skeans, MD;  Location: [place];  Service: General;  Laterality: N/A;   PEG PLACEMENT N/A 06/25/2015   Procedure: PERCUTANEOUS ENDOSCOPIC GASTROSTOMY (PEG) PLACEMENT;  Surgeon: Georganna Skeans, MD;  Location: [place];  Service: General;  Laterality: N/A;   PEG PLACEMENT N/A 06/28/2015   Procedure: PERCUTANEOUS ENDOSCOPIC GASTROSTOMY (PEG) PLACEMENT;  Surgeon: Georganna Skeans, MD;  Location: [place];  Service: General;  Laterality: N/A;   POLYPECTOMY  04/03/2018   Procedure: POLYPECTOMY;  Surgeon: Jerene Bears, MD;  [facility];  Service: Gastroenterology;;    Current Medications: No outpatient medications have been marked as taking for the 12/14/22 encounter (Appointment) with Freada Bergeron, MD.     Allergies:   Patient has no known allergies.   Social History   Socioeconomic History   Marital status: Single  Spouse name: Not on file   Number of children: 2   Years of education: Not on file   Highest education level: Not on file  Occupational History   Occupation: disabled  Tobacco Use   Smoking status: Former   Smokeless tobacco: Never  Vaping Use   Vaping Use: Never used  Substance and Sexual Activity   Alcohol use: Not Currently   Drug use: Yes    Types: Cocaine    Comment: Unable to assess as patient is non verbal at this time.   Sexual activity: Not on file    Comment: Patient non verbal at this time.   Other Topics Concern   Not on file  Social History Narrative   Not on file   Social Determinants of Health   Financial Resource Strain: Not on file  Food Insecurity: Not on file  Transportation Needs: Not on file  Physical Activity: Not on file  Stress: Not on file  Social Connections: Not on file     Family History: The patient's ***family history is negative for Colon cancer, Stomach cancer, Rectal cancer, Liver cancer, and Esophageal cancer.  ROS:   Please see the history of present illness.    *** All other systems reviewed and are negative.  EKGs/Labs/Other Studies Reviewed:    The following studies were reviewed today: ***  EKG:  EKG is *** ordered today.  The ekg ordered today demonstrates ***  Recent Labs: No results found for requested labs within last 365 days.  Recent Lipid Panel    Component Value Date/Time   CHOL 153 06/16/2015 0345   TRIG 176 (H) 06/16/2015 1035   HDL 16 (L) 06/16/2015 0345   CHOLHDL 9.6 06/16/2015 0345   VLDL 40 06/16/2015 0345   LDLCALC 97 06/16/2015 0345     Risk Assessment/Calculations:   {Does this patient have ATRIAL FIBRILLATION?:[phone]}  No BP recorded.  {Refresh Note OR Click here to enter BP  :1}***         Physical Exam:    VS:  There were no vitals taken for this visit.    Wt Readings from Last 3 Encounters:  02/12/21 162 lb (73.5 kg)  09/16/19 184 lb 8.4 oz (83.7 kg)  04/03/18 160 lb (72.6 kg)     GEN: *** Well nourished, well developed in no acute distress HEENT: Normal NECK: No JVD; No carotid bruits LYMPHATICS: No lymphadenopathy CARDIAC: ***RRR, no murmurs, rubs, gallops RESPIRATORY:  Clear to auscultation without rales, wheezing or rhonchi  ABDOMEN: Soft, non-tender, non-distended MUSCULOSKELETAL:  No edema; No deformity  SKIN: Warm and dry NEUROLOGIC:  Alert and oriented x 3 PSYCHIATRIC:  Normal affect   ASSESSMENT:    No diagnosis found. PLAN:    In order of problems listed  above:  ***      {Are you ordering a CV Procedure (e.g. stress test, cath, DCCV, TEE, etc)?   Press F2        :[id number]    Medication Adjustments/Labs and Tests Ordered: Current medicines are reviewed at length with the patient today.  Concerns regarding medicines are outlined above.  No orders of the defined types were placed in this encounter.  No orders of the defined types were placed in this encounter.   There are no Patient Instructions on file for this visit.   Signed, Freada Bergeron, MD  12/12/2022 4:54 PM    [place]

## 2022-12-14 ENCOUNTER — Ambulatory Visit: Payer: Medicare Other | Attending: Cardiology | Admitting: Cardiology

## 2022-12-16 ENCOUNTER — Ambulatory Visit (HOSPITAL_COMMUNITY)
Admission: RE | Admit: 2022-12-16 | Discharge: 2022-12-16 | Disposition: A | Discharge status: Home / Self Care | Payer: Medicare Other | Source: Ambulatory Visit | Attending: Interventional Radiology | Admitting: Interventional Radiology

## 2022-12-16 DIAGNOSIS — C220 Liver cell carcinoma: Secondary | ICD-10-CM | POA: Insufficient documentation

## 2022-12-16 MED ORDER — SODIUM CHLORIDE (PF) 0.9 % IJ SOLN
INTRAMUSCULAR | Status: AC
  Filled 2022-12-16: qty 50

## 2022-12-16 MED ORDER — IOHEXOL 300 MG/ML  SOLN
100.0000 mL | Freq: Once | INTRAMUSCULAR | Status: AC | PRN
  Administered 2022-12-16: 100 mL via INTRAVENOUS

## 2022-12-23 ENCOUNTER — Ambulatory Visit
Admission: RE | Admit: 2022-12-23 | Discharge: 2022-12-23 | Disposition: A | Discharge status: Home / Self Care | Payer: Medicare Other | Source: Ambulatory Visit | Attending: Interventional Radiology | Admitting: Interventional Radiology

## 2022-12-23 DIAGNOSIS — C220 Liver cell carcinoma: Secondary | ICD-10-CM

## 2022-12-23 HISTORY — PX: IR RADIOLOGIST EVAL & MGMT: IMG5224

## 2022-12-23 NOTE — Progress Notes (Signed)
Reason for visit: Follow up [place] Team(s): Primary Care: Mike Stanford, MD Gastroenterologist: Mike Leyland MD   Virtual Visit via Telephone Note:   I connected with [name] on 12/23/22 by telephone and verified that I am speaking with the correct person using two identifiers. I discussed the limitations, risks, security and privacy concerns of performing an evaluation and management service by telephone and the availability of in-person appointments.  They are joined in the visit by his facility nurse, Mike Parkin, RN.   History of present illness:   66 y.o. 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 Pt was diagnosed with [place] in 08/2021 s/p DEB TACE on 10/20/21. He had initial follow up imaging with multiphase CT in 02/2022, then 06/30/2022 and most recently on 12/16/22.   The patient is a resident at [facility], and follow up arranged virtually due to patient's transportation challenges. He reports to be in his usual state of health, without acute complaint.    Review of Systems: A 12-point ROS discussed, and pertinent positives are indicated in the HPI above.  All other systems are negative.   Past Medical History:  Diagnosis Date   Allergy    Anxiety    Asthma    Cerebral infarction ([place])    CHF (congestive heart failure) ([place])    Diastolic Heart failure per progress note from [facility] and Rehab 02/16/18   Contracture of ankle, left    per progress note from [facility] and Rehab-02/16/2018   Contracture of hip, left    per progress note from [facility] and Rehab 02/16/2018   Contracture of knee joint, right 10/09/2017   Diabetes ([place])    Dysphagia    GERD (gastroesophageal reflux disease)    per progress note from [facility] and Rehab 02/16/2018   Hemiplegia and hemiparesis 03/01/2016   following cerebral infarction  affecting left non-dominent side- per progress note from [facility] and Rehab   Hepatitis C virus    Hepatitis C   Hypertension    Major depressive disorder    Stroke ([place]) 06/27/2015   cerebral infarction    Substance abuse [facility])     Past Surgical History:  Procedure Laterality Date   COLONOSCOPY WITH PROPOFOL N/A 12/13/2016   Procedure: COLONOSCOPY WITH PROPOFOL;  Surgeon: Jerene Bears, MD;  [facility];  Service: Gastroenterology;  Laterality: N/A;   COLONOSCOPY WITH PROPOFOL N/A 04/03/2018   Procedure: COLONOSCOPY WITH PROPOFOL;  Surgeon: Jerene Bears, MD;  [facility];  Service: Gastroenterology;  Laterality: N/A;   ESOPHAGOGASTRODUODENOSCOPY (EGD) WITH PROPOFOL N/A 12/13/2016   Procedure: ESOPHAGOGASTRODUODENOSCOPY (EGD) WITH PROPOFOL;  Surgeon: Jerene Bears, MD;  [facility];  Service: Gastroenterology;  Laterality: N/A;   IR ANGIOGRAM SELECTIVE EACH ADDITIONAL VESSEL  10/20/2021   IR ANGIOGRAM SELECTIVE EACH ADDITIONAL VESSEL  10/20/2021   IR ANGIOGRAM VISCERAL SELECTIVE  10/20/2021   IR EMBO TUMOR ORGAN ISCHEMIA INFARCT INC GUIDE ROADMAPPING  10/20/2021   IR RADIOLOGIST EVAL & MGMT  09/30/2021   IR RADIOLOGIST EVAL & MGMT  11/17/2021   IR RADIOLOGIST EVAL & MGMT  03/16/2022   IR RADIOLOGIST EVAL & MGMT  07/04/2022   IR RADIOLOGIST EVAL & MGMT  12/23/2022   IR US GUIDE VASC ACCESS RIGHT  10/20/2021   LAPAROTOMY N/A 06/28/2015   Procedure: EXPLORATORY LAPAROTOMY;  Surgeon: Georganna Skeans,  MD;  Location: [place];  Service: General;  Laterality: N/A;   PEG PLACEMENT N/A 06/25/2015   Procedure: PERCUTANEOUS ENDOSCOPIC GASTROSTOMY (PEG) PLACEMENT;  Surgeon: Georganna Skeans, MD;  Location: [place];  Service: General;  Laterality: N/A;   PEG PLACEMENT N/A 06/28/2015   Procedure: PERCUTANEOUS ENDOSCOPIC GASTROSTOMY (PEG) PLACEMENT;  Surgeon: Georganna Skeans, MD;  Location: [place];  Service: General;  Laterality: N/A;   POLYPECTOMY  04/03/2018   Procedure:  POLYPECTOMY;  Surgeon: Jerene Bears, MD;  [facility];  Service: Gastroenterology;;    Allergies: Patient has no known allergies.  Medications: Prior to Admission medications   Medication Sig Start Date End Date Taking? Authorizing Provider  acetaminophen (TYLENOL) 325 MG tablet Take 650 mg by mouth every 6 (six) hours as needed for mild pain or moderate pain.    [provider]  albuterol (PROVENTIL) (2.5 MG/3ML) 0.083% nebulizer solution Take by nebulization. 01/18/21   [provider]  amLODipine (NORVASC) 10 MG tablet Take 10 mg by mouth daily.    [provider]  atorvastatin (LIPITOR) 10 MG tablet Take 10 mg by mouth at bedtime.    [provider]  budesonide-formoterol (SYMBICORT) 160-4.5 MCG/ACT inhaler Inhale 2 puffs into the lungs 2 (two) times daily.    [provider]  Cholecalciferol (VITAMIN D3) 1.25 MG (50000 UT) TABS Take 1 tablet by mouth See admin instructions. Give 1 tablet by mouth on the 22nd of every month for supplement.    [provider]  citalopram (CELEXA) 40 MG tablet Take 40 mg by mouth daily.    [provider]  diazepam (VALIUM) 2 MG tablet Take 1 tablet (2 mg total) by mouth 2 (two) times daily. Patient taking differently: Take 2 mg by mouth every 8 (eight) hours as needed. 09/17/19   [name], MD  docusate sodium (COLACE) 100 MG capsule Take 200 mg by mouth daily.     [provider]  fenofibrate (TRICOR) 48 MG tablet Take 48 mg by mouth daily.    [provider]  fluticasone (FLONASE) 50 MCG/ACT nasal spray Place 1 spray into both nostrils See admin instructions. Use 1 spray in each nostril daily. Use 1 spray in each nostril daily as needed for rhinitis    [provider]  furosemide (LASIX) 20 MG tablet Take 20 mg by mouth daily.    [provider]  labetalol (NORMODYNE) 300 MG tablet Take 1 tablet (300 mg total) by mouth 3 (three) times daily.  06/25/15   Garvin Fila, MD  lisinopril (PRINIVIL,ZESTRIL) 10 MG tablet Take 10 mg by mouth daily.    [provider]  oxyCODONE (OXY IR/ROXICODONE) 5 MG immediate release tablet Take by mouth. 02/09/21   [provider]  oxyCODONE (ROXICODONE) 5 MG immediate release tablet Take 1 tablet (5 mg total) by mouth every 6 (six) hours as needed for up to 20 doses for severe pain or moderate pain. 10/20/21   Theresa Duty, NP  polyethylene glycol (MIRALAX / GLYCOLAX) packet Take 17 g by mouth See admin instructions. Mix 17 g and drink once daily. Mix 17 g and drink once daily as needed for constipation    [provider]  propranolol (INDERAL) 10 MG tablet Take 10 mg by mouth 3 (three) times daily.    [provider]  senna (SENOKOT) 8.6 MG tablet Take 1 tablet by mouth daily.    [provider]  tamsulosin (FLOMAX) 0.4 MG CAPS capsule Take  0.4 mg by mouth daily.    [provider]  traZODone (DESYREL) 50 MG tablet Take 50 mg by mouth at bedtime.    [provider]     Family History  Problem Relation Age of Onset   Colon cancer Neg Hx    Stomach cancer Neg Hx    Rectal cancer Neg Hx    Liver cancer Neg Hx    Esophageal cancer Neg Hx     Social History   Socioeconomic History   Marital status: Single    Spouse name: Not on file   Number of children: 2   Years of education: Not on file   Highest education level: Not on file  Occupational History   Occupation: disabled  Tobacco Use   Smoking status: Former   Smokeless tobacco: Never  Scientific laboratory technician Use: Never used  Substance and Sexual Activity   Alcohol use: Not Currently   Drug use: Yes    Types: Cocaine    Comment: Unable to assess as patient is non verbal at this time.   Sexual activity: Not on file    Comment: Patient non verbal at this time.  Other Topics Concern   Not on file  Social History Narrative   Not on file   Social Determinants of Health    Financial Resource Strain: Not on file  Food Insecurity: Not on file  Transportation Needs: Not on file  Physical Activity: Not on file  Stress: Not on file  Social Connections: Not on file    Vital Signs: There were no vitals taken for this visit.  Physical Exam Deferred secondary to virtual visit.   Imaging:  Multiphase CT AP, 12/16/22 no APHE at dominant seg VII lesion and additional, smaller / peripheral seg VII lesion without evidence of viable tumor.    Labs:  CBC: No results for input(s): "WBC", "HGB", "HCT", "PLT" in the last 8760 hours.  COAGS: No results for input(s): "INR", "APTT" in the last 8760 hours.  BMP: No results for input(s): "NA", "K", "CL", "CO2", "GLUCOSE", "BUN", "CALCIUM", "CREATININE", "GFRNONAA", "GFRAA" in the last 8760 hours.  Invalid input(s): "CMP"  LIVER FUNCTION TESTS: No results for input(s): "BILITOT", "AST", "ALT", "ALKPHOS", "PROT", "ALBUMIN" in the last 8760 hours.   Assessment and Plan:  67 y.o. 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 Pt was diagnosed with [place] in 08/2021 s/p DEB TACE on 10/20/21.   Follow up imaging w multiphasic CT AP on 03/10/22, 06/30/22 and most recently on 12/16/22 demonstrating LR - treated with no viable tumor  AFP 3.6 (03/10/22), 2.6 (10/20/21)   *GI follow up for his liver care *follow up CT AP multiphase, LFTs and AFP in 9-12 months *until then, follow up PRN   Thank you for allowing [place] to participate in the care of this Patient. Please contact me with questions, concerns, or if new issues arise.   Electronically Signed:  Michaelle Birks, MD [facility] - Inpatient Radiology   Pager. [phone] Clinic. [phone]  I spent a total of 30 Minutes of non-face-to-face time in clinical consultation, greater than 50% of which was counseling/coordinating care for ?[name]  evaluation for [facility] treatment and follow up.

## 2023-01-16 ENCOUNTER — Ambulatory Visit: Payer: Medicare Other | Attending: Interventional Cardiology | Admitting: Interventional Cardiology

## 2023-01-16 VITALS — BP 122/76 | HR 75

## 2023-01-16 DIAGNOSIS — I5032 Chronic diastolic (congestive) heart failure: Secondary | ICD-10-CM | POA: Diagnosis present

## 2023-01-16 DIAGNOSIS — G8220 Paraplegia, unspecified: Secondary | ICD-10-CM | POA: Diagnosis present

## 2023-01-16 NOTE — Progress Notes (Signed)
Cardiology Office Note   Date:  01/16/2023   ID:  Mike Rowe, DOB 09-Oct-1956, MRN [id number]  PCP:  Rica Koyanagi, MD    No chief complaint on file.  Chronic diastolic heart failure  Wt Readings from Last 3 Encounters:  02/12/21 162 lb (73.5 kg)  09/16/19 184 lb 8.4 oz (83.7 kg)  04/03/18 160 lb (72.6 kg)       History of Present Illness: Mike Rowe is a 67 y.o. male who is being seen today for the evaluation of chronic diastolic heart failure at the request of Rica Koyanagi, MD.   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  Diagnosed with diastolic heart failure at [facility] and rehab facility back in April 2019.  Normal LV/RV/valvular function noted on echocardiogram in July 2016.  There was grade 1 diastolic dysfunction at that time.  Denies : Chest pain. Dizziness. Leg edema. Nitroglycerin use. Orthopnea. Palpitations. Paroxysmal nocturnal dyspnea. Shortness of breath. Syncope.     Past Medical History:  Diagnosis Date   Allergy    Anxiety    Asthma    Cerebral infarction ([place])    CHF (congestive heart failure) ([place])    Diastolic Heart failure per progress note from [facility] and Rehab 02/16/18   Contracture of ankle, left    per progress note from [facility] and Rehab-02/16/2018   Contracture of hip, left    per progress note from [name] 02/16/2018   Contracture of knee joint, right 10/09/2017   Diabetes ([place])    Dysphagia    GERD (gastroesophageal reflux disease)    per progress note from [facility] 02/16/2018   Hemiplegia and hemiparesis 03/01/2016   following cerebral infarction affecting left non-dominent side- per progress note from [facility]   Hepatitis C virus    Hepatitis C   Hypertension    Major depressive disorder    Stroke ([place]) 06/27/2015   cerebral infarction    Substance abuse [facility])     Past Surgical History:  Procedure  Laterality Date   COLONOSCOPY WITH PROPOFOL N/A 12/13/2016   Procedure: COLONOSCOPY WITH PROPOFOL;  Surgeon: Jerene Bears, MD;  Location: [facility];  Service: Gastroenterology;  Laterality: N/A;   COLONOSCOPY WITH PROPOFOL N/A 04/03/2018   Procedure: COLONOSCOPY WITH PROPOFOL;  Surgeon: Jerene Bears, MD;  Location: [facility];  Service: Gastroenterology;  Laterality: N/A;   ESOPHAGOGASTRODUODENOSCOPY (EGD) WITH PROPOFOL N/A 12/13/2016   Procedure: ESOPHAGOGASTRODUODENOSCOPY (EGD) WITH PROPOFOL;  Surgeon: Jerene Bears, MD;  Location: [facility];  Service: Gastroenterology;  Laterality: N/A;   IR ANGIOGRAM SELECTIVE EACH ADDITIONAL VESSEL  10/20/2021   IR ANGIOGRAM SELECTIVE EACH ADDITIONAL VESSEL  10/20/2021   IR ANGIOGRAM VISCERAL SELECTIVE  10/20/2021   IR EMBO TUMOR ORGAN ISCHEMIA INFARCT INC GUIDE ROADMAPPING  10/20/2021   IR RADIOLOGIST EVAL & MGMT  09/30/2021   IR RADIOLOGIST EVAL & MGMT  11/17/2021   IR RADIOLOGIST EVAL & MGMT  03/16/2022   IR RADIOLOGIST EVAL & MGMT  07/04/2022   IR RADIOLOGIST EVAL & MGMT  12/23/2022   IR US GUIDE VASC ACCESS RIGHT  10/20/2021   LAPAROTOMY N/A 06/28/2015   Procedure: EXPLORATORY LAPAROTOMY;  Surgeon: Georganna Skeans, MD;  Location: [place];  Service: General;  Laterality: N/A;   PEG PLACEMENT N/A 06/25/2015   Procedure: PERCUTANEOUS ENDOSCOPIC GASTROSTOMY (PEG) PLACEMENT;  Surgeon: Georganna Skeans, MD;  Location: [place];  Service: General;  Laterality: N/A;  PEG PLACEMENT N/A 06/28/2015   Procedure: PERCUTANEOUS ENDOSCOPIC GASTROSTOMY (PEG) PLACEMENT;  Surgeon: Georganna Skeans, MD;  Location: [place];  Service: General;  Laterality: N/A;   POLYPECTOMY  04/03/2018   Procedure: POLYPECTOMY;  Surgeon: Jerene Bears, MD;  Location: [facility];  Service: Gastroenterology;;     Current Outpatient Medications  Medication Sig Dispense Refill   acetaminophen (TYLENOL) 325 MG tablet Take 650 mg by mouth every 6 (six) hours as needed for mild pain or moderate pain.      atorvastatin (LIPITOR) 10 MG tablet Take 10 mg by mouth at bedtime.     budesonide-formoterol (SYMBICORT) 160-4.5 MCG/ACT inhaler Inhale 2 puffs into the lungs 2 (two) times daily.     Cholecalciferol (VITAMIN D3) 1.25 MG (50000 UT) TABS Take 1 tablet by mouth See admin instructions. Give 1 tablet by mouth on the 22nd of every month for supplement.     diazepam (VALIUM) 2 MG tablet Take 1 tablet (2 mg total) by mouth 2 (two) times daily. (Patient taking differently: Take 2 mg by mouth every 8 (eight) hours as needed.) 6 tablet 0   docusate sodium (COLACE) 100 MG capsule Take 200 mg by mouth daily.      DULoxetine (CYMBALTA) 60 MG capsule Take 60 mg by mouth daily.     fenofibrate (TRICOR) 48 MG tablet Take 48 mg by mouth daily.     fluticasone (FLONASE) 50 MCG/ACT nasal spray Place 1 spray into both nostrils See admin instructions. Use 1 spray in each nostril daily. Use 1 spray in each nostril daily as needed for rhinitis     furosemide (LASIX) 20 MG tablet Take 20 mg by mouth daily.     ipratropium-albuterol (DUONEB) 0.5-2.5 (3) MG/3ML SOLN Take by nebulization.     LINZESS 145 MCG CAPS capsule Take 145 mcg by mouth daily.     Melatonin 10 MG CAPS Take 1 capsule by mouth at bedtime.     oxyCODONE (ROXICODONE) 5 MG immediate release tablet Take 1 tablet (5 mg total) by mouth every 6 (six) hours as needed for up to 20 doses for severe pain or moderate pain. 20 tablet 0   propranolol (INDERAL) 10 MG tablet Take 10 mg by mouth 3 (three) times daily.     senna (SENOKOT) 8.6 MG tablet Take 1 tablet by mouth daily.     spironolactone (ALDACTONE) 25 MG tablet Take 25 mg by mouth 2 (two) times daily.     tamsulosin (FLOMAX) 0.4 MG CAPS capsule Take 0.4 mg by mouth daily.     traZODone (DESYREL) 50 MG tablet Take 50 mg by mouth at bedtime.     triamcinolone cream (KENALOG) 0.1 % Apply 1 Application topically 2 (two) times daily.     albuterol (PROVENTIL) (2.5 MG/3ML) 0.083% nebulizer solution Take by  nebulization. (Patient not taking: Reported on 01/16/2023)     amLODipine (NORVASC) 10 MG tablet Take 10 mg by mouth daily. (Patient not taking: Reported on 01/16/2023)     citalopram (CELEXA) 40 MG tablet Take 40 mg by mouth daily. (Patient not taking: Reported on 01/16/2023)     labetalol (NORMODYNE) 300 MG tablet Take 1 tablet (300 mg total) by mouth 3 (three) times daily. (Patient not taking: Reported on 01/16/2023) 100 tablet 2   lisinopril (PRINIVIL,ZESTRIL) 10 MG tablet Take 10 mg by mouth daily. (Patient not taking: Reported on 01/16/2023)     oxyCODONE (OXY IR/ROXICODONE) 5 MG immediate release tablet Take by mouth. (Patient not taking: Reported  on 01/16/2023)     polyethylene glycol (MIRALAX / GLYCOLAX) packet Take 17 g by mouth See admin instructions. Mix 17 g and drink once daily. Mix 17 g and drink once daily as needed for constipation (Patient not taking: Reported on 01/16/2023)     No current facility-administered medications for this visit.    Allergies:   Patient has no known allergies.    Social History:  The patient  reports that he has quit smoking. He has never used smokeless tobacco. He reports that he does not currently use alcohol. He reports current drug use. Drug: Cocaine.   Family History:  The patient's family history is not on file. noncontributory   ROS:  Please see the history of present illness.   Otherwise, review of systems are positive for left-sided paralysis; .   All other systems are reviewed and negative.    PHYSICAL EXAM: VS:  There were no vitals taken for this visit. , BMI There is no height or weight on file to calculate BMI. GEN: Chronically ill-appearing.  Stretcher bound HEENT: normal Neck: no JVD, carotid bruits, or masses Cardiac: RRR; no murmurs, rubs, or gallops,no edema  Respiratory:  clear to auscultation bilaterally, normal work of breathing GI: soft, nontender, nondistended, + BS MS: Left arm contracted Skin: warm and dry, bruises on forearms, 2  cm erythematous spot on left thigh Neuro:  Strength and sensation are intact Psych: euthymic mood, full affect   EKG:   The ekg ordered today demonstrates NSR, nonspecific ST changes   Recent Labs: No results found for requested labs within last 365 days.   Lipid Panel    Component Value Date/Time   CHOL 153 06/16/2015 0345   TRIG 176 (H) 06/16/2015 1035   HDL 16 (L) 06/16/2015 0345   CHOLHDL 9.6 06/16/2015 0345   VLDL 40 06/16/2015 0345   LDLCALC 97 06/16/2015 0345     Other studies Reviewed: Additional studies/ records that were reviewed today with results demonstrating: Facility and old hospital records reviewed.   ASSESSMENT AND PLAN:  Chronic diastolic heart failure: Diastolic dysfunction noted on prior echocardiogram.  He appears euvolemic.  Continue furosemide.  If he shows signs of volume overload, could give an additional dose of torsemide 20 mg.  Blood pressure well-controlled. Paraplegia from prior CVA.  Hopefully, he can have some type of physical activity or physical therapy.    Current medicines are reviewed at length with the patient today.  The patient concerns regarding his medicines were addressed.  The following changes have been made:  No change  Labs/ tests ordered today include:  No orders of the defined types were placed in this encounter.   Recommend 150 minutes/week of aerobic exercise Low fat, low carb, high fiber diet recommended  Disposition:   FU as needed   Signed, Larae Grooms, MD  01/16/2023 2:26 PM    [facility] [address], [place], [place]  [address] Phone: [phone]; Fax: [phone]

## 2023-01-16 NOTE — Addendum Note (Signed)
Addended by: Sharee Holster R on: 01/16/2023 04:51 PM   Modules accepted: Orders

## 2023-01-16 NOTE — Patient Instructions (Signed)
Medication Instructions:  Your physician recommends that you continue on your current medications as directed. Please refer to the Current Medication list given to you today.  *If you need a refill on your cardiac medications before your next appointment, please call your pharmacy*   Lab Work: none If you have labs (blood work) drawn today and your tests are completely normal, you will receive your results only by: [place] (if you have MyChart) OR A paper copy in the mail If you have any lab test that is abnormal or we need to change your treatment, we will call you to review the results.   Testing/Procedures: none   Follow-Up: At [facility], you and your health needs are our priority.  As part of our continuing mission to provide you with exceptional heart care, we have created designated Provider Care Teams.  These Care Teams include your primary Cardiologist (physician) and Advanced Practice Providers (APPs -  Physician Assistants and Nurse Practitioners) who all work together to provide you with the care you need, when you need it.  We recommend signing up for the patient portal called "MyChart".  Sign up information is provided on this After Visit Summary.  MyChart is used to connect with patients for Virtual Visits (Telemedicine).  Patients are able to view lab/test results, encounter notes, upcoming appointments, etc.  Non-urgent messages can be sent to your provider as well.   To learn more about what you can do with MyChart, go to [email or web address].    Your next appointment:   As needed  Provider:   Larae Grooms, MD     Other Instructions

## 2023-07-20 NOTE — Addendum Note (Signed)
Encounter addended by: Edilia Bo on: 07/20/2023 1:32 PM  Actions taken: Imaging Exam ended

## 2023-10-09 IMAGING — XA IR EMBO TUMOR ORGAN ISCHEMIA INFARCT INC GUIDE ROADMAPPING
2 series · 17 of 24 positions shown · non-contrast
Comparison: IR ultrasound, 08/26/2021.

INDICATION: Hemiplegic with new diagnosis of HCC.

EXAM:
1. ULTRASOUND GUIDANCE FOR ARTERIAL ACCESS
2. CELIAC (1st ORDER), COMMON HEPATIC (2nd ORDER) and RIGHT HEPATIC
ARTERIOGRAMS (3rd ORDER)
3. TRANSARTERIAL CHEMOEMBOLIZATION WITH DRUG-ELUTING BEADS (LIBERIS
FLEENOR), of RIGHT hepatic lobe multifocal HCC
TECHNIQUE: Informed written consent was obtained from the patient after a
discussion of the risks, benefits and alternatives to treatment.
Questions regarding the procedure were encouraged and answered. A
timeout was performed prior to the initiation of the procedure.

[Series 352: axial spin 1 · axial · 1.9mm · 0.48mm/px · z∈[-727,-532]mm · 8 of 114 slices shown]
[im 1/114]
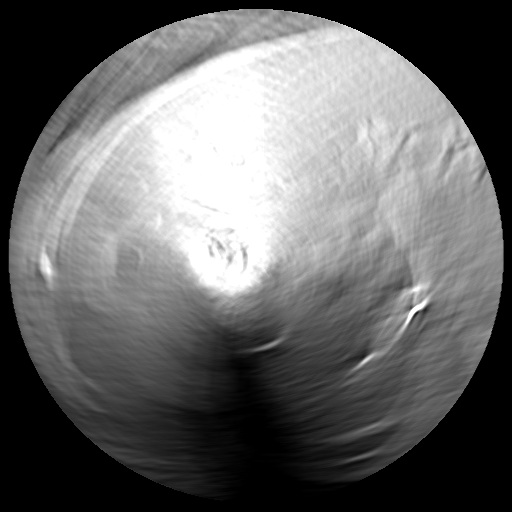
[im 21/114]
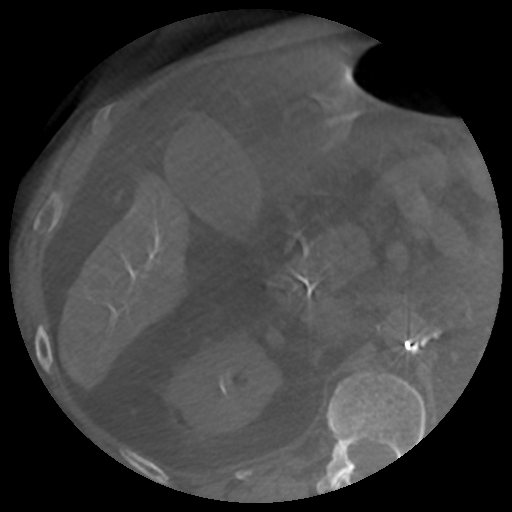
[im 31/114]
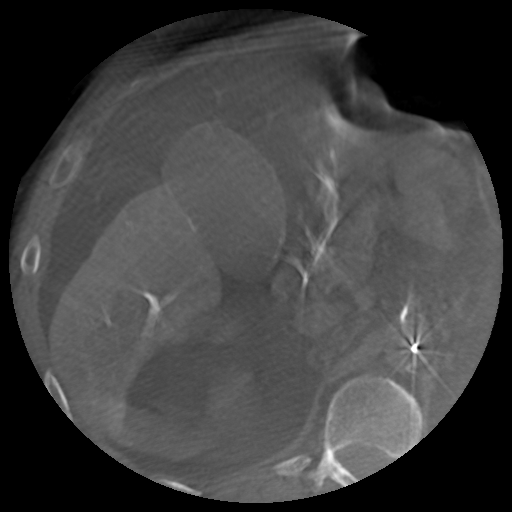
[im 42/114]
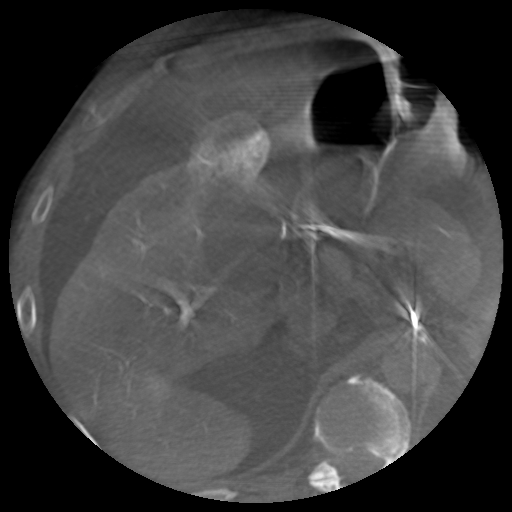
[im 62/114]
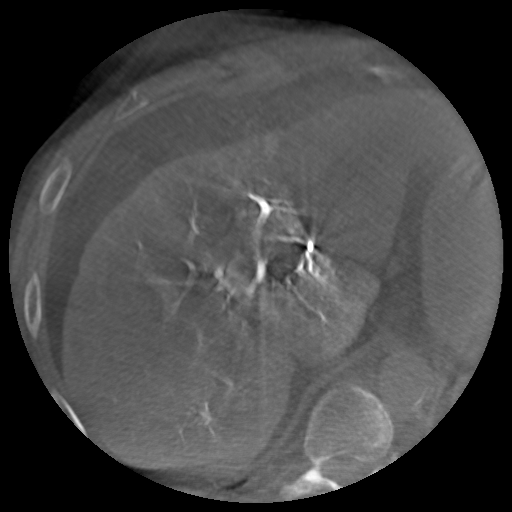
[im 72/114]
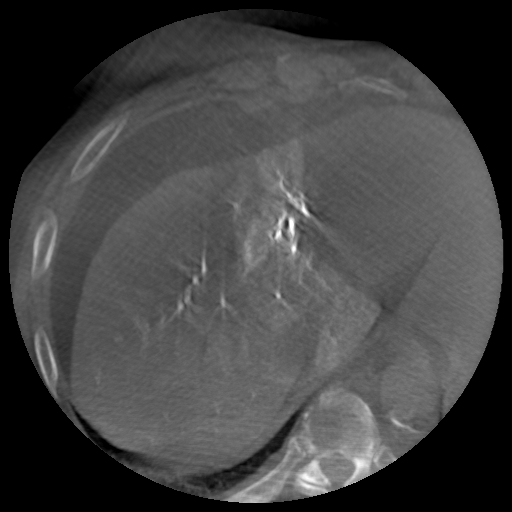
[im 93/114]
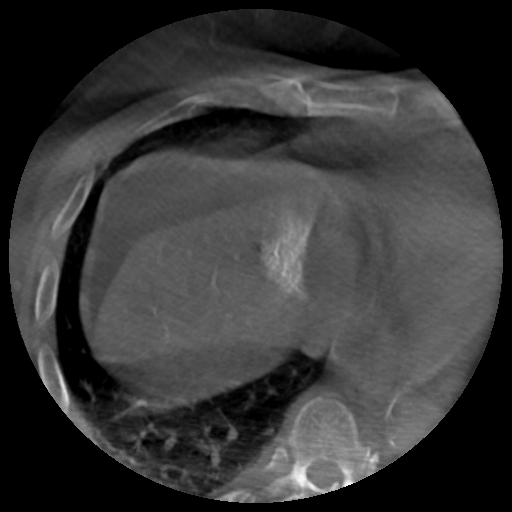
[im 103/114]
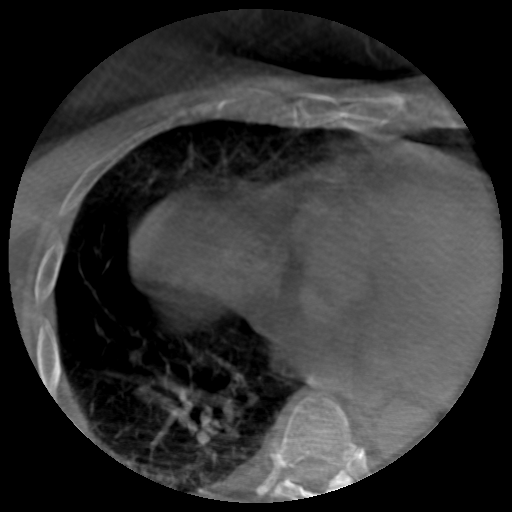

[Series 452: axial spin 2 · axial · 1.9mm · 0.48mm/px · z∈[-727,-511]mm · 9 of 114 slices shown]
[im 1/114]
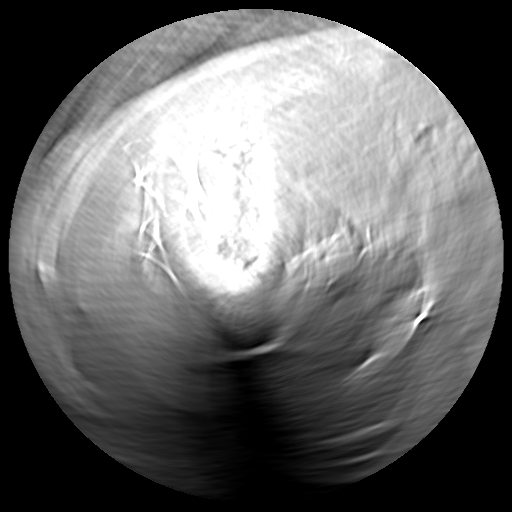
[im 11/114]
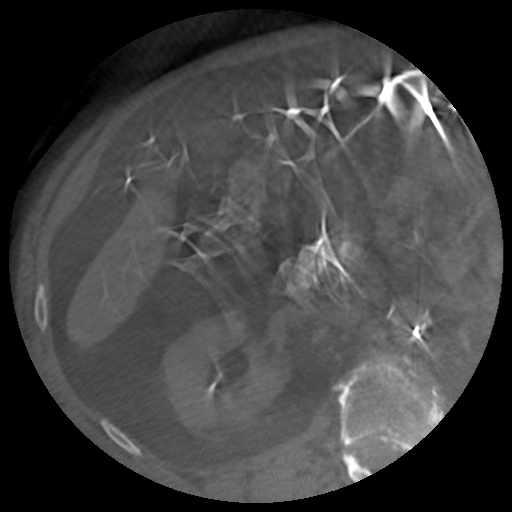
[im 21/114]
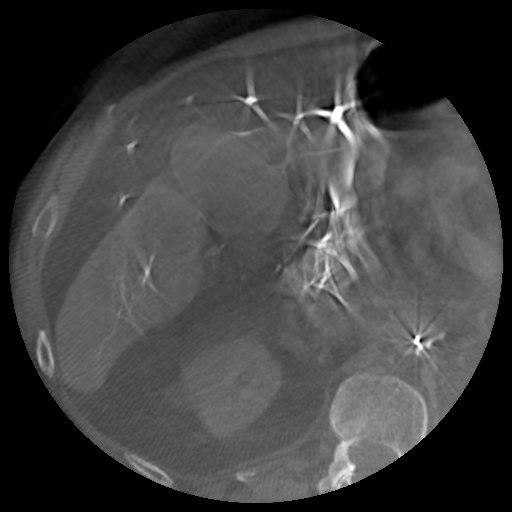
[im 42/114]
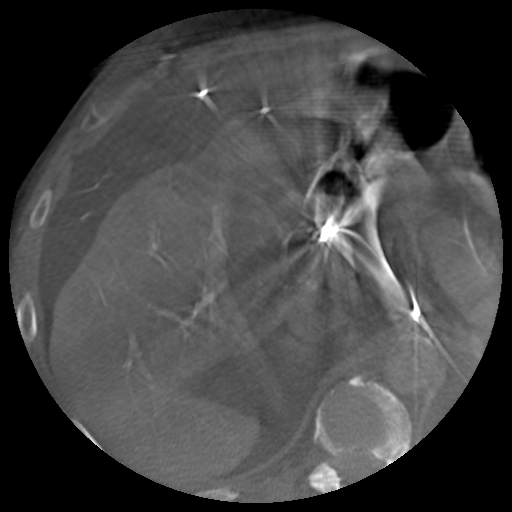
[im 52/114]
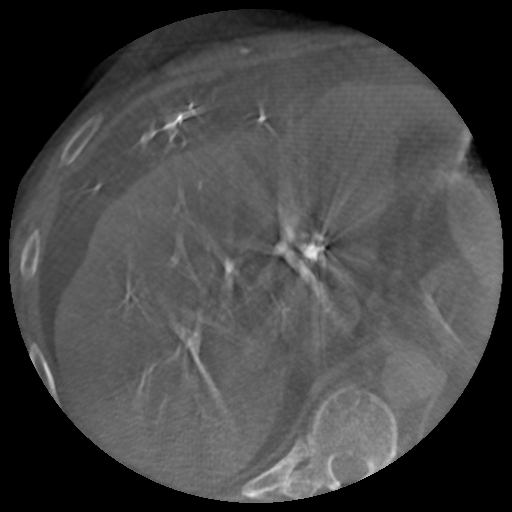
[im 72/114]
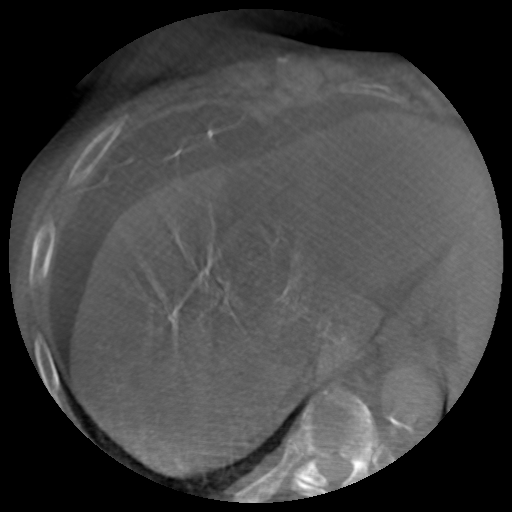
[im 83/114]
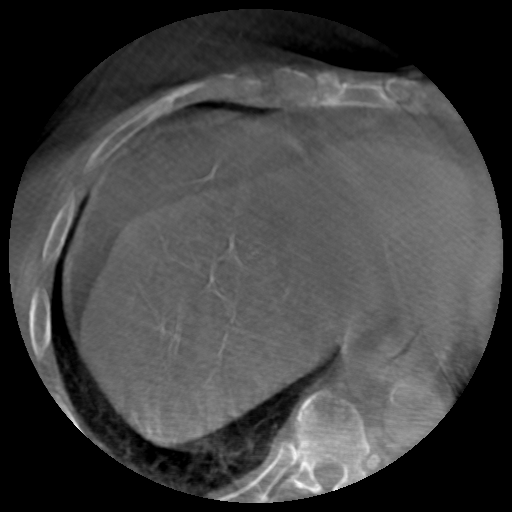
[im 93/114]
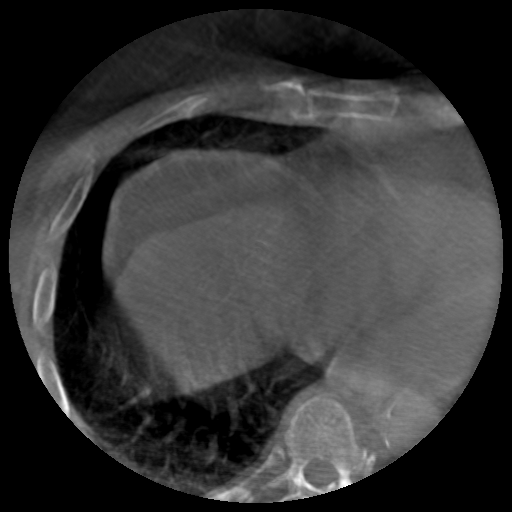
[im 114/114]
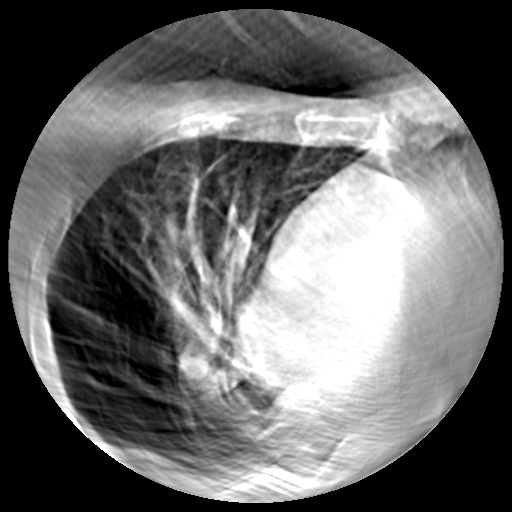

[17 of 24 positions shown; findings below may reference images not displayed]

CT AP, 08/21/2021. CT chest
abdomen pelvis, 09/02/2021.

MEDICATIONS:
2 g Ancef IV was administered for periprocedural antibiotics

RADIOPHARMACEUTICALS:  None

CONTRAST:  50mL OMNIPAQUE IOHEXOL 300 MG/ML SOLN, 50mL OMNIPAQUE
IOHEXOL 300 MG/ML SOLN

ANESTHESIA/SEDATION:
1 hour 29 minutes.

Moderate sedation was administered with 6 mg Versed, 100 mcg
Fentanyl. Additionally 1 mg Dilaudid IV was administered.

FLUOROSCOPY TIME:  15 minutes, 12 seconds.  304 mGy

ACCESS:
Right common femoral artery; hemostasis achieved with Angio-Seal
closure.

COMPLICATIONS:
None immediate.
The right groin was prepped and draped in the usual sterile fashion,
and a sterile drape was applied covering the operative field.
Maximum barrier sterile technique with sterile gowns and gloves were
used for the procedure. A timeout was performed prior to the
initiation of the procedure. Local anesthesia was provided with 1%
lidocaine.

The right femoral head was marked fluoroscopically. Under ultrasound
guidance, the right common femoral artery was accessed with a
micropuncture kit after the overlying soft tissues were anesthetized
with 1% lidocaine. An ultrasound image was saved for documentation
purposes. The micropuncture sheath was exchanged for a 5 French
vascular sheath over a Bentson wire. A closure arteriogram was
performed through the side of the sheath confirming access within
the right common femoral artery.

Under fluoroscopic guidance a 5 French, 65 cm Ojumu Famoriyo2 catheter was
advanced over a guidewire into the abdominal aorta. The catheter was
then manipulated into the celiac artery, through which a
power-injected digital subtraction angiogram (DSA) was obtained. A
2.8 F, 130 cm Progreat microcatheter was placed through the 5-F
catheter and guided over a microguidewire into the celiac axis,
through which subselective DSAs were performed.
PURPOSE OF THE ARTERIOGRAM:The previous diagnostic angiogram was
accessible but the patient's condition had changed. Therefore a new
complete diagnostic angiogram was performed. The decision to proceed
with an interventional procedure was made based on this new
diagnostic angiogram.
CONE BEAM CT ANGIOGRAPHY:A cone beam CTA was then performed from the
common hepatic artery which demonstrated tumor blush, and a suitable
site for injection of chemo embolic was identified.
CHEMOEMBOLIZATION: At the branch RIGHT hepatic artery, a premixed
solution containing 100-300 um time-releasing beads saturated with
100 mg doxorubicin and intravenous contrast was infused through the
microcatheter under fluoroscopy until arterial stasis was achieved.
A total of 2 vial of drug-eluding beads were used.

At this point, the procedure was terminated. All wires, catheters
and sheaths were removed from the patient. Hemostasis was achieved
at the right groin access site with an Angio-Seal closure device. A
dressing was applied. The patient tolerated procedure well without
immediate postprocedural complication.
FINDINGS: 1. CELIAC ARTERY: Normal origin, branching, caliber and patency.
2. COMMON HEPATIC ARTERY: Bifurcation of the GDA and common hepatic
artery, which left hepatic artery, and right hepatic artery
3. RIGHT HEPATIC ARTERY: Normal origin, branching, caliber, and
patency.
Early branch to segment 7 and 8. No dominant tumoral feeding from
segments 5 and 6
4. Segment 7 branch RIGHT hepatic artery: Tumor blush was
identified, and a suitable site for injection of chemoembolic agent
was identified.
IMPRESSION: Successful transarterial chemoembolization (TACE) of the branch
RIGHT hepatic artery supplying hepatic segment VII, with 2 vials of
100-300 um drug-eluting beads.

PLAN:
The patient was discharged on the same day of the procedure. He will
follow-up with me in clinic in approximately 3 months.

## 2024-08-26 NOTE — Telephone Encounter (Signed)
 Spoke with Rosina at facility and she states it is not the location but transportation cancels every time.  He has nonemergent transportation so I asked what can be done for him to get to appointment and she stated speak with nurse manager Tayla who will back in the office Thursday. Please call facility on Thursday number in chart

## 2024-08-29 NOTE — Telephone Encounter (Signed)
 Spoke to rehab Adam R.N. That  we need help with this. Rosina is to contact   us  re this issue Tayla not   in today out this week.

## 2024-08-29 NOTE — Telephone Encounter (Signed)
 Left message with fd to have Tayla to call

## 2024-08-30 NOTE — Telephone Encounter (Signed)
 Spoke to [place] gave  her the number to call  and reschedule  the patient , explained that [facility] is the only facility , that can perform the px needed.Facility is going to try another  transport company , she is to call us  back if issues.

## 2024-08-30 NOTE — Telephone Encounter (Signed)
 Rosina from [facility] and rehab would like a call back [phone]

## 2024-08-30 NOTE — Telephone Encounter (Signed)
Appreciated!

## 2024-09-19 ENCOUNTER — Other Ambulatory Visit: Payer: Self-pay | Admitting: Interventional Radiology

## 2024-09-19 DIAGNOSIS — C220 Liver cell carcinoma: Secondary | ICD-10-CM

## 2024-10-01 ENCOUNTER — Other Ambulatory Visit: Payer: Self-pay | Admitting: Interventional Radiology

## 2024-10-01 DIAGNOSIS — C220 Liver cell carcinoma: Secondary | ICD-10-CM

## 2024-10-31 ENCOUNTER — Ambulatory Visit (INDEPENDENT_AMBULATORY_CARE_PROVIDER_SITE_OTHER)
Admission: RE | Admit: 2024-10-31 | Discharge: 2024-10-31 | Disposition: A | Discharge status: Home / Self Care | Source: Ambulatory Visit | Attending: Interventional Radiology | Admitting: Interventional Radiology

## 2024-10-31 DIAGNOSIS — C220 Liver cell carcinoma: Secondary | ICD-10-CM

## 2024-10-31 LAB — I-STAT CREATININE (MANUAL ENTRY): Creatinine, Ser: 1 (ref 0.50–1.10)

## 2024-10-31 MED ADMIN — Iohexol IV Soln 350 MG/ML: 100 mL | INTRAVENOUS | @ 15:00:00 | NDC 00407141491
